# Patient Record
Sex: Female | Born: 1945 | Race: White | Hispanic: No | Marital: Married | Smoking: Former smoker
Health system: Southern US, Community
[De-identification: ages and names within clinical notes are randomized; demographics above are authoritative.]

## PROBLEM LIST (undated history)

## (undated) DIAGNOSIS — K529 Noninfective gastroenteritis and colitis, unspecified: Secondary | ICD-10-CM

## (undated) DIAGNOSIS — I10 Essential (primary) hypertension: Secondary | ICD-10-CM

## (undated) DIAGNOSIS — M8580 Other specified disorders of bone density and structure, unspecified site: Secondary | ICD-10-CM

## (undated) DIAGNOSIS — Z9889 Other specified postprocedural states: Secondary | ICD-10-CM

## (undated) DIAGNOSIS — L439 Lichen planus, unspecified: Secondary | ICD-10-CM

## (undated) DIAGNOSIS — M1990 Unspecified osteoarthritis, unspecified site: Secondary | ICD-10-CM

## (undated) DIAGNOSIS — Z9289 Personal history of other medical treatment: Secondary | ICD-10-CM

## (undated) DIAGNOSIS — E039 Hypothyroidism, unspecified: Secondary | ICD-10-CM

## (undated) DIAGNOSIS — E785 Hyperlipidemia, unspecified: Secondary | ICD-10-CM

## (undated) HISTORY — DX: Personal history of other medical treatment: Z92.89

## (undated) HISTORY — PX: APPENDECTOMY: SHX54

## (undated) HISTORY — DX: Noninfective gastroenteritis and colitis, unspecified: K52.9

## (undated) HISTORY — DX: Essential (primary) hypertension: I10

## (undated) HISTORY — PX: OOPHORECTOMY: SHX86

## (undated) HISTORY — PX: DILATION AND CURETTAGE OF UTERUS: SHX78

## (undated) HISTORY — DX: Hyperlipidemia, unspecified: E78.5

## (undated) HISTORY — DX: Other specified disorders of bone density and structure, unspecified site: M85.80

## (undated) HISTORY — PX: OTHER SURGICAL HISTORY: SHX169

## (undated) HISTORY — DX: Unspecified osteoarthritis, unspecified site: M19.90

## (undated) HISTORY — DX: Hypothyroidism, unspecified: E03.9

## (undated) HISTORY — PX: TUBAL LIGATION: SHX77

## (undated) HISTORY — DX: Other specified postprocedural states: Z98.890

## (undated) HISTORY — DX: Lichen planus, unspecified: L43.9

## (undated) HISTORY — PX: HERNIA REPAIR: SHX51

## (undated) SURGERY — Surgical Case
Anesthesia: *Unknown

---

## 1983-05-21 HISTORY — PX: NEPHRECTOMY: SHX65

## 1996-05-20 HISTORY — PX: COLON SURGERY: SHX602

## 2005-09-17 ENCOUNTER — Ambulatory Visit: Payer: Self-pay | Admitting: Family Medicine

## 2006-12-25 ENCOUNTER — Ambulatory Visit: Payer: Self-pay | Admitting: Family Medicine

## 2007-02-20 ENCOUNTER — Encounter: Payer: Self-pay | Admitting: Family Medicine

## 2007-03-03 ENCOUNTER — Ambulatory Visit: Payer: Self-pay | Admitting: Unknown Physician Specialty

## 2008-02-03 ENCOUNTER — Encounter: Payer: Self-pay | Admitting: Family Medicine

## 2008-02-03 ENCOUNTER — Ambulatory Visit: Payer: Self-pay | Admitting: Family Medicine

## 2009-10-27 ENCOUNTER — Ambulatory Visit: Payer: Self-pay | Admitting: Family Medicine

## 2009-10-27 DIAGNOSIS — Z905 Acquired absence of kidney: Secondary | ICD-10-CM | POA: Insufficient documentation

## 2009-10-27 DIAGNOSIS — I10 Essential (primary) hypertension: Secondary | ICD-10-CM | POA: Insufficient documentation

## 2009-10-27 DIAGNOSIS — M1990 Unspecified osteoarthritis, unspecified site: Secondary | ICD-10-CM | POA: Insufficient documentation

## 2009-10-27 DIAGNOSIS — E785 Hyperlipidemia, unspecified: Secondary | ICD-10-CM | POA: Insufficient documentation

## 2009-10-27 DIAGNOSIS — E039 Hypothyroidism, unspecified: Secondary | ICD-10-CM | POA: Insufficient documentation

## 2009-10-27 LAB — CONVERTED CEMR LAB: Vit D, 25-Hydroxy: 33 ng/mL (ref 30–89)

## 2009-10-30 LAB — CONVERTED CEMR LAB
ALT: 27 units/L (ref 0–35)
AST: 24 units/L (ref 0–37)
Albumin: 4.5 g/dL (ref 3.5–5.2)
Alkaline Phosphatase: 76 units/L (ref 39–117)
Bilirubin, Direct: 0.2 mg/dL (ref 0.0–0.3)
CO2: 28 meq/L (ref 19–32)
Chloride: 105 meq/L (ref 96–112)
Free T4: 1.2 ng/dL (ref 0.6–1.6)
Glucose, Bld: 93 mg/dL (ref 70–99)
Potassium: 3.2 meq/L — ABNORMAL LOW (ref 3.5–5.1)
Sodium: 141 meq/L (ref 135–145)
Total Protein: 7.6 g/dL (ref 6.0–8.3)

## 2009-11-02 ENCOUNTER — Ambulatory Visit: Payer: Self-pay | Admitting: Family Medicine

## 2009-11-02 ENCOUNTER — Other Ambulatory Visit: Admission: RE | Admit: 2009-11-02 | Discharge: 2009-11-02 | Payer: Self-pay | Admitting: Family Medicine

## 2009-11-02 LAB — CONVERTED CEMR LAB
Cholesterol: 187 mg/dL (ref 0–200)
LDL Cholesterol: 103 mg/dL — ABNORMAL HIGH (ref 0–99)
Triglycerides: 119 mg/dL (ref 0.0–149.0)
VLDL: 23.8 mg/dL (ref 0.0–40.0)

## 2009-11-02 LAB — HM PAP SMEAR

## 2009-11-13 ENCOUNTER — Encounter (INDEPENDENT_AMBULATORY_CARE_PROVIDER_SITE_OTHER): Payer: Self-pay | Admitting: *Deleted

## 2009-11-13 LAB — CONVERTED CEMR LAB: Pap Smear: NORMAL

## 2009-11-17 ENCOUNTER — Telehealth: Payer: Self-pay | Admitting: Family Medicine

## 2009-11-22 ENCOUNTER — Encounter: Payer: Self-pay | Admitting: Family Medicine

## 2009-11-22 ENCOUNTER — Ambulatory Visit: Payer: Self-pay | Admitting: Family Medicine

## 2010-01-10 ENCOUNTER — Telehealth: Payer: Self-pay | Admitting: Family Medicine

## 2010-02-05 ENCOUNTER — Ambulatory Visit: Payer: Self-pay | Admitting: Family Medicine

## 2010-02-05 LAB — CONVERTED CEMR LAB
ALT: 17 units/L (ref 0–35)
Bilirubin, Direct: 0.1 mg/dL (ref 0.0–0.3)
HDL: 54.6 mg/dL (ref >39.00)
LDL Cholesterol: 81 mg/dL (ref 0–99)
Total Bilirubin: 0.9 mg/dL (ref 0.3–1.2)
Total Protein: 7.2 g/dL (ref 6.0–8.3)
VLDL: 31.2 mg/dL (ref 0.0–40.0)

## 2010-04-24 ENCOUNTER — Telehealth: Payer: Self-pay | Admitting: Family Medicine

## 2010-05-29 ENCOUNTER — Telehealth: Payer: Self-pay | Admitting: Family Medicine

## 2010-06-19 NOTE — Miscellaneous (Signed)
Summary: pap results  Clinical Lists Changes  Observations: Added new observation of PAP SMEAR: normal (11/13/2009 9:01)      Preventive Care Screening  Pap Smear:    Date:  11/13/2009    Results:  normal

## 2010-06-19 NOTE — Assessment & Plan Note (Signed)
Summary: CPX W/PAP/RBH   Vital Signs:  Patient profile:   65 year old female Height:      63.25 inches Weight:      160 pounds BMI:     28.22 Temp:     98.4 degrees F oral Pulse rate:   76 / minute Pulse rhythm:   regular BP sitting:   130 / 82  (left arm) Cuff size:   regular  Vitals Entered By: Linde Gillis CMA Duncan Dull) (November 02, 2009 8:06 AM) CC: 30 minute exam with pap   History of Present Illness: 65 yo here for CPX with pap.  Hypothyroidism- has been on Levothyroxine 75 micrograms for years.  TSH stable.  HLD- on Simvastatin 20 mg daily and Fish oil 2000 mg daily.  Has had myalgias since she started Simva years ago.  No muscle weakness.  Has not had lipid panel in years.  HTN- on amlodipine 10 mg daily and HCTZ 25 mg daily.   No CP, blurred vision, SOB.  Arthritis- hands, knees and feet hurt all the time.  Was told she has osteoarthritis, has swelling/deformity in hands.  Not taking anything for pain at this time.   No redness or warmth.  Well woman- due for pap, mammogram, colonoscopy, and immunizations.  She is refusing all immunzations.  She wants to wait before scheduling colonscopy because of her current insurance (HSA).  Will be on Medicare in March.  Allergies (verified): No Known Drug Allergies  Review of Systems      See HPI General:  Denies chills and fever. Eyes:  Denies blurring. ENT:  Denies decreased hearing. CV:  Denies chest pain or discomfort. Resp:  Denies shortness of breath. GI:  Denies abdominal pain, bloody stools, and change in bowel habits. GU:  Denies abnormal vaginal bleeding, discharge, and dysuria. MS:  Complains of joint pain and joint swelling; denies joint redness. Derm:  Denies rash. Neuro:  Denies headaches. Psych:  Denies anxiety and depression. Endo:  Denies cold intolerance and heat intolerance.  Physical Exam  General:  alert, well-developed, and overweight-appearing.   Head:  normocephalic, atraumatic, and no abnormalities  observed.   Eyes:  vision grossly intact, pupils equal, and pupils round.   Ears:  R ear normal and L ear normal.   Nose:  no external deformity.   Mouth:  good dentition.   Neck:  No deformities, masses, or tenderness noted. Breasts:  No mass, nodules, thickening, tenderness, bulging, retraction, inflamation, nipple discharge or skin changes noted.   Lungs:  Normal respiratory effort, chest expands symmetrically. Lungs are clear to auscultation, no crackles or wheezes. Heart:  Normal rate and regular rhythm. S1 and S2 normal without gallop, murmur, click, rub or other extra sounds. Abdomen:  Bowel sounds positive,abdomen soft and non-tender without masses, organomegaly or hernias noted. Rectal:  no external abnormalities and no hemorrhoids.   Genitalia:  Pelvic Exam:        External: normal female genitalia without lesions or masses        Vagina: normal without lesions or masses        Cervix: normal without lesions or masses        Adnexa: normal bimanual exam without masses or fullness        Uterus: normal by palpation        Pap smear: performed Msk:  No deformity or scoliosis noted of thoracic or lumbar spine.   Extremities:  trace left pedal edema and trace right pedal edema.  trace  left pedal edema.   enlarged DIP, PIP bilaterally, no redness or erythema. Neurologic:  alert & oriented X3.   Skin:  Intact without suspicious lesions or rashes Psych:  Cognition and judgment appear intact. Alert and cooperative with normal attention span and concentration. No apparent delusions, illusions, hallucinations   Impression & Recommendations:  Problem # 1:  Preventive Health Care (ICD-V70.0) Reviewed preventive care protocols, scheduled due services, and updated immunizations Discussed nutrition, exercise, diet, and healthy lifestyle. Pap today. Colonscopy deferred.  Problem # 2:  HYPERLIPIDEMIA (ICD-272.4) Assessment: Unchanged Recheck FLP today, perhpas wean off of Statin if  stable. Her updated medication list for this problem includes:    Simvastatin 20 Mg Tabs (Simvastatin) .Marland Kitchen... Take one tablet by mouth daily  Orders: TLB-Lipid Panel (80061-LIPID)  Labs Reviewed: SGOT: 24 (10/27/2009)   SGPT: 27 (10/27/2009)  Problem # 3:  OSTEOARTHRITIS (ICD-715.90) Assessment: Deteriorated Tramadol, follow up with me in 3 weeks to see if there is any improvement. Her updated medication list for this problem includes:    Aspirin 81 Mg Tabs (Aspirin) .Marland Kitchen... Take one tablet by mouth daily    Tramadol Hcl 50 Mg Tabs (Tramadol hcl) .Marland Kitchen... 1 tab by mouth two times a day as needed pain  Complete Medication List: 1)  Fish Oil 1000 Mg Caps (Omega-3 fatty acids) .... Take one tablet by mouth two times a day 2)  Levothroid 75 Mcg Tabs (Levothyroxine sodium) .... Take one tablet by mouth daily 3)  Simvastatin 20 Mg Tabs (Simvastatin) .... Take one tablet by mouth daily 4)  Amlodipine Besylate 10 Mg Tabs (Amlodipine besylate) .... Take one tablet by mouth daily 5)  Hydrochlorothiazide 25 Mg Tabs (Hydrochlorothiazide) .... Take one tablet by mouth daily 6)  Aspirin 81 Mg Tabs (Aspirin) .... Take one tablet by mouth daily 7)  Tramadol Hcl 50 Mg Tabs (Tramadol hcl) .Marland Kitchen.. 1 tab by mouth two times a day as needed pain  Patient Instructions: 1)  Great to see you, Heather Potter. 2)  I will call you with your cholesterol results tomorrow or Monday. Prescriptions: TRAMADOL HCL 50 MG  TABS (TRAMADOL HCL) 1 tab by mouth two times a day as needed pain  #30 x 0   Entered and Authorized by:   Ruthe Mannan MD   Signed by:   Ruthe Mannan MD on 11/02/2009   Method used:   Electronically to        [facility] [address] (retail)       [address]       [place], [place]  [address]       Ph: [phone]       Fax: [phone]   RxID:   [id number]   Current Allergies (reviewed today): No known allergies     Prevention & Chronic Care Immunizations   Influenza vaccine: Not  documented   Influenza vaccine due: Refused  (10/27/2009)    Tetanus booster: Not documented   Tetanus booster due: Refused  (10/27/2009)    Pneumococcal vaccine: Not documented    H. zoster vaccine: Not documented  Colorectal Screening   Hemoccult: Not documented   Hemoccult action/deferral: Deferred  (11/02/2009)    Colonoscopy: normal  (11/03/1997)   Colonoscopy action/deferral: Deferred  (11/02/2009)   Colonoscopy due: 11/04/2007  Other Screening   Pap smear: Not documented   Pap smear action/deferral: Ordered  (11/02/2009)    Mammogram: Not documented   Mammogram action/deferral: Ordered  (11/02/2009)    DXA bone density scan: Not documented  Smoking status: Not documented  Lipids   Total Cholesterol: Not documented   Lipid panel action/deferral: Lipid Panel ordered   LDL: Not documented   LDL Direct: Not documented   HDL: Not documented   Triglycerides: Not documented    SGOT (AST): 24  (10/27/2009)   SGPT (ALT): 27  (10/27/2009)   Alkaline phosphatase: 76  (10/27/2009)   Total bilirubin: 1.1  (10/27/2009)    Lipid flowsheet reviewed?: Yes   Progress toward LDL goal: At goal  Hypertension   Last Blood Pressure: 130 / 82  (11/02/2009)   Serum creatinine: 0.9  (10/27/2009)   Serum potassium 3.2  (10/27/2009)  Self-Management Support :    Hypertension self-management support: Not documented    Lipid self-management support: Not documented    Nursing Instructions: Pap smear today

## 2010-06-19 NOTE — Progress Notes (Signed)
Summary: Tramadol helping her pain  Phone Note Call from Patient Call back at Home Phone [phone]   Caller: Patient Call For: Ruthe Mannan MD Summary of Call: Patient called to let Dr. Dayton Martes know that the Tramadol is helping her pain.  She does still have a little pain in her hands from time to time.  She was only given 30 tablets and wanted to know if Dr. Dayton Martes wants to keep her on this medication or not.  If so she needs another Rx sent in to [facility] on [address]  Please advise. Initial call taken by: Linde Gillis CMA Duncan Dull),  November 17, 2009 3:59 PM    Prescriptions: TRAMADOL HCL 50 MG  TABS (TRAMADOL HCL) 1 tab by mouth two times a day as needed pain  #60 x 0   Entered and Authorized by:   Ruthe Mannan MD   Signed by:   Ruthe Mannan MD on 11/17/2009   Method used:   Electronically to        [facility] [address] (retail)       [address]       [place], [place]  [address]       Ph: [phone]       Fax: [phone]   RxID:   [id number] TRAMADOL HCL 50 MG  TABS (TRAMADOL HCL) 1 tab by mouth two times a day as needed pain  #60 x 0   Entered and Authorized by:   Ruthe Mannan MD   Signed by:   Ruthe Mannan MD on 11/17/2009   Method used:   Telephoned to ...         RxID:   [id number]   Appended Document: Tramadol helping her pain Patient notified, Rx sent to pharmacy.

## 2010-06-19 NOTE — Progress Notes (Signed)
Summary: tramadol   Phone Note Refill Request Message from:  Fax from Pharmacy on April 24, 2010 11:18 AM  Refills Requested: Medication #1:  TRAMADOL HCL 50 MG  TABS 1 tab by mouth two times a day as needed pain.   Last Refilled: 01/10/2010 Refill request from [facility] pharmacy [phone].   Initial call taken by: Melody Comas,  April 24, 2010 11:20 AM  Follow-up for Phone Call        Rx called to pharmacy.  Follow-up by: Melody Comas,  April 24, 2010 1:53 PM    Prescriptions: TRAMADOL HCL 50 MG  TABS (TRAMADOL HCL) 1 tab by mouth two times a day as needed pain  #60 x 0   Entered and Authorized by:   Ruthe Mannan MD   Signed by:   Ruthe Mannan MD on 04/24/2010   Method used:   Telephoned to ...         RxID:   [id number]

## 2010-06-19 NOTE — Letter (Signed)
Summary: Generic Letter  [facility]  [address] [place], [place] [address]   Phone: [phone]  Fax: [phone]    02/05/2010  Heather Potter [address] [place], [place]  [address]  Dear [name],    [name] we have received your lab results back.  Your cholesterol on the lower dose of simvastatin looks great.  Triglycerides barely elevated but otherwise fantastic!  I wouldn't change a thing!  Keep up the good work!       Sincerely,        Linde Gillis CMA (AAMA) for Dr. Ruthe Mannan, MD

## 2010-06-19 NOTE — Assessment & Plan Note (Signed)
Summary: NEW PT TO ESTABH/DLO   Vital Signs:  Patient profile:   65 year old female Height:      63.25 inches Weight:      160 pounds BMI:     28.22 Temp:     97.8 degrees F oral Pulse rate:   72 / minute Pulse rhythm:   regular BP sitting:   120 / 72  (left arm) Cuff size:   regular  Vitals Entered By: Linde Gillis CMA Duncan Dull) (2009/10/30 9:49 AM) CC: new patient   History of Present Illness: 65 yo here to establish care.  Hypothyroidism- has been on Levothyroxine 75 micrograms for years.  Has not noticed any symptoms of hypo or hyothyroidism.  Has been at least 2 years since she has had lab work.  HLD- on Simvastatin 20 mg daily and Fish oil 2000 mg daily.  Has had myalgias since she started Simva years ago.  No muscle weakness.  Has not had lipid panel in years.  HTN- on amlodipine 10 mg daily and HCTZ 25 mg daily.  She is unsure if has ever tried an ACEI.  Does have LE edema on amlodipine.  No CP, blurred vision, SOB.  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  Has never had issues with renal failure.  Arthritis- hands, knees and feet hurt all the time.  Was told she has osteoarthritis, has swelling/deformity in hands.  Not taking anything for pain at this time.   No redness or warmth.  Well woman- due for pap, mammogram, colonoscopy, and immunizations.  She is refusing all immunzations.  She wants to wait before scheduling colonscopy because of her current insurance (HSA).  Will be on Medicare in March.  Preventive Screening-Counseling & Management  Caffeine-Diet-Exercise     Does Patient Exercise: yes      Drug Use:  no.    Current Medications (verified): 1)  Fish Oil 1000 Mg Caps (Omega-3 Fatty Acids) .... Take One Tablet By Mouth Two Times A Day 2)  Levothroid 75 Mcg Tabs (Levothyroxine Sodium) .... Take One Tablet By Mouth Daily 3)  Simvastatin 20 Mg Tabs (Simvastatin) .... Take One Tablet By Mouth Daily 4)  Amlodipine Besylate 10 Mg Tabs  (Amlodipine Besylate) .... Take One Tablet By Mouth Daily 5)  Hydrochlorothiazide 25 Mg Tabs (Hydrochlorothiazide) .... 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  Quit smoking in 2009. Alcohol use-yes Drug use-no Regular exercise-yes  Risk Factors: Exercise: yes (Oct 30, 2009)  Past Medical History: Hyperlipidemia Hypertension Hypothyroidism Osteoarthritis  Past Surgical History: Appendectomy GYN surgery- she knows that she had a BTL and right ovary removed, unsure about her uterus/cervix. 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  Family History: Reviewed history and no changes required. 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  Quit smoking in 2009. Alcohol use-yes Drug use-no Regular exercise-yes Drug Use:  no Does Patient Exercise:  yes  Review of Systems      See HPI General:  Denies chills, fever, and malaise. Eyes:  Denies blurring. ENT:  Denies difficulty  swallowing. CV:  Complains of swelling of feet; denies chest pain or discomfort, leg cramps with exertion, lightheadness, and shortness of breath with exertion. Resp:  Denies shortness of breath. GI:  Denies abdominal pain, bloody stools, and change in bowel habits. GU:  Denies abnormal vaginal bleeding, dysuria, and urinary frequency. MS:  Complains of joint pain; denies joint redness and joint swelling. Derm:  Denies rash. Neuro:  Denies headaches. Psych:  Denies anxiety and depression. Endo:  Denies excessive hunger and polyuria. Heme:  Denies abnormal bruising.  Physical  Exam  General:  alert, well-developed, and overweight-appearing.   Head:  normocephalic, atraumatic, and no abnormalities observed.   Eyes:  vision grossly intact, pupils equal, and pupils round.   Ears:  R ear normal and L ear normal.   Nose:  no external deformity.   Mouth:  good dentition.   Neck:  No deformities, masses, or tenderness noted. Lungs:  Normal respiratory effort, chest expands symmetrically. Lungs are clear to auscultation, no crackles or wheezes. Heart:  Normal rate and regular rhythm. S1 and S2 normal without gallop, murmur, click, rub or other extra sounds. Abdomen:  Bowel sounds positive,abdomen soft and non-tender without masses, organomegaly or hernias noted. Msk:  enlarged PIP joints and enlarged DIP joints.  enlarged PIP joints and enlarged DIP joints.   Extremities:  trace left pedal edema and trace right pedal edema.  trace left pedal edema.   Neurologic:  alert & oriented X3.  alert & oriented X3.   Skin:  Intact without suspicious lesions or rashes Psych:  Cognition and judgment appear intact. Alert and cooperative with normal attention span and concentration. No apparent delusions, illusions, hallucinations   Impression & Recommendations:  Problem # 1:  OSTEOARTHRITIS (ICD-715.90) Assessment Deteriorated Awaiting labs from current physician, but will draw ESR and RA today.   Needs to consider something for  inflammation since her symptoms are worsening,  Will discuss at CPX once we have labs and old records. Orders: Venipuncture ([address]) TLB-Sedimentation Rate (ESR) (85652-ESR) TLB-Rheumatoid Factor (RA) (60[address]-UJ) T-Vitamin D (25-Hydroxy) ([address]29)  Problem # 2:  HYPERLIPIDEMIA (ICD-272.4) Assessment: Unchanged  recheck FLP and hepatic panel today. Also check Vit d as deficiency can worsen myalgias. Her updated medication list for this problem includes:    Simvastatin 20 Mg Tabs (Simvastatin) .Marland Kitchen... Take one tablet by mouth  daily  Orders: TLB-Hepatic/Liver Function Pnl (80076-HEPATIC) T-Vitamin D (25-Hydroxy) ([address]-08657)  Her updated medication list for this problem includes:    Simvastatin 20 Mg Tabs (Simvastatin) .Marland Kitchen... Take one tablet by mouth daily  Problem # 3:  HYPERTENSION (ICD-401.9) Assessment: Unchanged  Stable for now. ?why she is on amlodipine since she does have some LE edema (not currently much on exam today).  Will await records.  ?is there a contraindication to an ACEI s/p nephrectomy. Her updated medication list for this problem includes:    Amlodipine Besylate 10 Mg Tabs (Amlodipine besylate) .Marland Kitchen... Take one tablet by mouth daily    Hydrochlorothiazide 25 Mg Tabs (Hydrochlorothiazide) .Marland Kitchen... Take one tablet by mouth daily  Orders: Venipuncture ([address]) TLB-BMP (Basic Metabolic Panel-BMET) (80048-METABOL)  Her updated medication list for this problem includes:    Amlodipine Besylate 10 Mg Tabs (Amlodipine besylate) .Marland Kitchen... Take one tablet by mouth daily    Hydrochlorothiazide 25 Mg Tabs (Hydrochlorothiazide) .Marland Kitchen... Take one tablet by mouth daily  Problem # 4:  HYPOTHYROIDISM (ICD-244.9) Assessment: Unchanged  Recheck TSH, T3, T4 today. Her updated medication list for this problem includes:    Levothroid 75  Mcg Tabs (Levothyroxine sodium) .Marland Kitchen... Take one tablet by mouth daily  Orders: Venipuncture ([address]) TLB-TSH (Thyroid Stimulating Hormone) (84443-TSH) TLB-T4 (Thyrox), Free [phone]) Specimen Handling (82956) T-Vitamin D (25-Hydroxy) (21308-65784)  Her updated medication list for this problem includes:    Levothroid 75 Mcg Tabs (Levothyroxine sodium) .Marland Kitchen... Take one tablet by mouth daily  Problem # 5:  Preventive Health Care (ICD-V70.0) Assessment: Comment Only set up mammogram today. schedule CPX/pap.  Complete Medication List: 1)  Fish Oil 1000 Mg Caps (Omega-3 fatty acids) .... Take one tablet by mouth two times a day 2)  Levothroid 75 Mcg Tabs (Levothyroxine sodium)  .... Take one tablet by mouth daily 3)  Simvastatin 20 Mg Tabs (Simvastatin) .... Take one tablet by mouth daily 4)  Amlodipine Besylate 10 Mg Tabs (Amlodipine besylate) .... Take one tablet by mouth daily 5)  Hydrochlorothiazide 25 Mg Tabs (Hydrochlorothiazide) .... Take one tablet by mouth daily  Other Orders: Radiology Referral (Radiology)  Patient Instructions: 1)  Great to meet you. 2)  Please stop by to see Shirlee Limerick on your way out to set up your mammogram. 3)  please set up your complete physical and pap smear.  Current Allergies (reviewed today): No known allergies   Flu Vaccine Next Due:  Refused TD Next Due:  Refused Herpes Zoster Next Due:  Refused Colonoscopy Result Date:  11/03/1997 Colonoscopy Result:  normal Colonoscopy Next Due:  10 yr

## 2010-06-19 NOTE — Letter (Signed)
Summary: Records Dated 04-04-06 thru 07-06-08/[facility]  Records Dated 04-04-06 thru 07-06-08/[facility]   Imported By: Lanelle Bal 11/21/2009 12:25:16  _____________________________________________________________________  External Attachment:    Type:   Image     Comment:   External Document

## 2010-06-19 NOTE — Progress Notes (Signed)
Summary: refill request for tramadol  Phone Note Refill Request Message from:  Fax from Pharmacy  Refills Requested: Medication #1:  TRAMADOL HCL 50 MG  TABS 1 tab by mouth two times a day as needed pain.   Last Refilled: 11/17/2009 Faxed request from [facility], [phone]  Initial call taken by: Lowella Petties CMA,  January 10, 2010 8:21 AM  Follow-up for Phone Call        Rx called to pharmacy Follow-up by: Linde Gillis CMA Duncan Dull),  January 10, 2010 8:48 AM    Prescriptions: TRAMADOL HCL 50 MG  TABS (TRAMADOL HCL) 1 tab by mouth two times a day as needed pain  #60 x 0   Entered and Authorized by:   Ruthe Mannan MD   Signed by:   Ruthe Mannan MD on 01/10/2010   Method used:   Telephoned to ...         RxID:   [id number]

## 2010-06-21 NOTE — Progress Notes (Signed)
Summary: refill request for tramadol  Phone Note Refill Request Call back at [facility] Phone [phone] Message from:  Patient  Refills Requested: Medication #1:  TRAMADOL HCL 50 MG  TABS 1 tab by mouth two times a day as needed pain. Pt wants a 90 day supply called to [facility] in [place].  Initial call taken by: Lowella Petties CMA, AAMA,  May 29, 2010 11:06 AM Caller: Patient Call For: Heather Mannan MD  Follow-up for Phone Call        Rx called to pharmacy, patient notified via telephone. Follow-up by: Linde Gillis CMA Duncan Dull),  May 29, 2010 11:47 AM    Prescriptions: TRAMADOL HCL 50 MG  TABS (TRAMADOL HCL) 1 tab by mouth two times a day as needed pain  #180 x 0   Entered and Authorized by:   Heather Mannan MD   Signed by:   Heather Mannan MD on 05/29/2010   Method used:   Telephoned to ...         RxID:   [id number] TRAMADOL HCL 50 MG  TABS (TRAMADOL HCL) 1 tab by mouth two times a day as needed pain  #60 x 0   Entered and Authorized by:   Heather Mannan MD   Signed by:   Heather Mannan MD on 05/29/2010   Method used:   Telephoned to ...         RxID:   [id number]

## 2010-08-01 ENCOUNTER — Telehealth: Payer: Self-pay | Admitting: Family Medicine

## 2010-08-07 NOTE — Progress Notes (Signed)
Summary: needs new scripts for mail order  Phone Note Refill Request Call back at [facility] Phone [phone] Message from:  Patient  Refills Requested: Medication #1:  LEVOTHROID 75 MCG TABS take one tablet by mouth daily  Medication #2:  AMLODIPINE BESYLATE 10 MG TABS take one tablet by mouth daily  Medication #3:  HYDROCHLOROTHIAZIDE 25 MG TABS take one tablet by mouth daily  Medication #4:  SIMVASTATIN 20 MG TABS take one tablet by mouth daily Pt needs written scripts for mail order, please call when ready and she will pick up.  Initial call taken by: Lowella Petties CMA, AAMA,  August 01, 2010 10:32 AM    Prescriptions: HYDROCHLOROTHIAZIDE 25 MG TABS (HYDROCHLOROTHIAZIDE) take one tablet by mouth daily  #90 x 3   Entered and Authorized by:   Ruthe Mannan MD   Signed by:   Ruthe Mannan MD on 08/01/2010   Method used:   Print then Give to Patient   RxID:   [id number] AMLODIPINE BESYLATE 10 MG TABS (AMLODIPINE BESYLATE) take one tablet by mouth daily  #90 x 3   Entered and Authorized by:   Ruthe Mannan MD   Signed by:   Ruthe Mannan MD on 08/01/2010   Method used:   Print then Give to Patient   RxID:   [id number] SIMVASTATIN 20 MG TABS (SIMVASTATIN) take one tablet by mouth daily  #90 x 3   Entered and Authorized by:   Ruthe Mannan MD   Signed by:   Ruthe Mannan MD on 08/01/2010   Method used:   Print then Give to Patient   RxID:   [id number] LEVOTHROID 75 MCG TABS (LEVOTHYROXINE SODIUM) take one tablet by mouth daily  #90 x 3   Entered and Authorized by:   Ruthe Mannan MD   Signed by:   Ruthe Mannan MD on 08/01/2010   Method used:   Print then Give to Patient   RxID:   [id number]   Appended Document: needs new scripts for mail order Left message with female that answered the phone, not Ms. Vento that Rx's were ready for pick up will be left at front desk.

## 2010-11-19 ENCOUNTER — Encounter: Payer: Self-pay | Admitting: Gastroenterology

## 2010-11-19 ENCOUNTER — Telehealth: Payer: Self-pay | Admitting: *Deleted

## 2010-11-19 DIAGNOSIS — Z1231 Encounter for screening mammogram for malignant neoplasm of breast: Secondary | ICD-10-CM

## 2010-11-19 DIAGNOSIS — Z1211 Encounter for screening for malignant neoplasm of colon: Secondary | ICD-10-CM

## 2010-11-19 NOTE — Telephone Encounter (Signed)
Patient is asking for an order for her yearly mammogram to be sent to [facility] center, also is asking if she could get referral for her colonoscopy.

## 2010-11-19 NOTE — Telephone Encounter (Signed)
Orders entered

## 2010-11-20 ENCOUNTER — Other Ambulatory Visit: Payer: Self-pay | Admitting: Family Medicine

## 2010-11-20 DIAGNOSIS — I10 Essential (primary) hypertension: Secondary | ICD-10-CM

## 2010-11-20 DIAGNOSIS — E785 Hyperlipidemia, unspecified: Secondary | ICD-10-CM

## 2010-11-20 DIAGNOSIS — E039 Hypothyroidism, unspecified: Secondary | ICD-10-CM

## 2010-11-21 ENCOUNTER — Encounter: Payer: Self-pay | Admitting: Family Medicine

## 2010-11-22 ENCOUNTER — Other Ambulatory Visit (INDEPENDENT_AMBULATORY_CARE_PROVIDER_SITE_OTHER): Payer: MEDICARE | Admitting: Family Medicine

## 2010-11-22 DIAGNOSIS — E785 Hyperlipidemia, unspecified: Secondary | ICD-10-CM

## 2010-11-22 DIAGNOSIS — I10 Essential (primary) hypertension: Secondary | ICD-10-CM

## 2010-11-22 DIAGNOSIS — E039 Hypothyroidism, unspecified: Secondary | ICD-10-CM

## 2010-11-22 LAB — LIPID PANEL: Cholesterol: 176 mg/dL (ref 0–200)

## 2010-11-22 LAB — BASIC METABOLIC PANEL
BUN: 22 mg/dL (ref 6–23)
Chloride: 106 mEq/L (ref 96–112)
Glucose, Bld: 92 mg/dL (ref 70–99)
Potassium: 3.6 mEq/L (ref 3.5–5.1)
Sodium: 137 mEq/L (ref 135–145)

## 2010-11-22 LAB — TSH: TSH: 0.8 u[IU]/mL (ref 0.35–5.50)

## 2010-11-28 ENCOUNTER — Ambulatory Visit (INDEPENDENT_AMBULATORY_CARE_PROVIDER_SITE_OTHER): Payer: MEDICARE | Admitting: Family Medicine

## 2010-11-28 ENCOUNTER — Encounter: Payer: Self-pay | Admitting: Family Medicine

## 2010-11-28 VITALS — BP 110/80 | HR 61 | Temp 98.2°F | Ht 64.0 in | Wt 154.8 lb

## 2010-11-28 DIAGNOSIS — Z780 Asymptomatic menopausal state: Secondary | ICD-10-CM

## 2010-11-28 DIAGNOSIS — Z2911 Encounter for prophylactic immunotherapy for respiratory syncytial virus (RSV): Secondary | ICD-10-CM

## 2010-11-28 DIAGNOSIS — E785 Hyperlipidemia, unspecified: Secondary | ICD-10-CM

## 2010-11-28 DIAGNOSIS — Z0000 Encounter for general adult medical examination without abnormal findings: Secondary | ICD-10-CM

## 2010-11-28 DIAGNOSIS — I10 Essential (primary) hypertension: Secondary | ICD-10-CM

## 2010-11-28 DIAGNOSIS — E039 Hypothyroidism, unspecified: Secondary | ICD-10-CM

## 2010-11-28 DIAGNOSIS — Z23 Encounter for immunization: Secondary | ICD-10-CM

## 2010-11-28 DIAGNOSIS — Z13820 Encounter for screening for osteoporosis: Secondary | ICD-10-CM

## 2010-11-28 NOTE — Progress Notes (Signed)
65 yo here for her initial welcome to medicare visit.  I have personally reviewed the Medicare Annual Wellness questionnaire and have noted 1. The patient's medical and social history 2. Their use of alcohol, tobacco or illicit drugs 3. Their current medications and supplements 4. The patient's functional ability including ADL's, fall risks, home safety risks and hearing or visual             Impairment.- independent for all ADLs. 5. Diet and physical activities- very physically active 6. Evidence for depression or mood disorders- no signs or symptoms of depression or mood disorders.    Hypothyroidism- has been on Levothyroxine 75 micrograms for years.  TSH stable. Lab Results  Component Value Date   TSH 0.80 11/22/2010    HLD- on Simvastatin 20 mg daily and Fish oil 2000 mg daily.    Lab Results  Component Value Date   HDL 62.40 11/22/2010   HDL 54.60 02/05/2010   HDL 36.64 11/02/2009   Lab Results  Component Value Date   LDLCALC 98 11/22/2010   LDLCALC 81 02/05/2010   LDLCALC 103* 11/02/2009   Lab Results  Component Value Date   TRIG 76.0 11/22/2010   TRIG 156.0* 02/05/2010   TRIG 119.0 11/02/2009    Lab Results  Component Value Date   ALT 17 02/05/2010   AST 20 02/05/2010   ALKPHOS 70 02/05/2010   BILITOT 0.9 02/05/2010     HTN- on amlodipine 10 mg daily and HCTZ 25 mg daily.   No CP, blurred vision, SOB.    Well woman- mammogram and colonoscopy scheduled.   Due for Zostavax and Pneumovax.  Patient Active Problem List  Diagnoses  . HYPOTHYROIDISM  . HYPERLIPIDEMIA  . HYPERTENSION  . OSTEOARTHRITIS  . NEPHRECTOMY, HX OF  . Routine general medical examination at a health care facility   Past Medical History  Diagnosis Date  . Hyperlipidemia   . Hypertension   . Hypothyroidism   . Osteoarthritis   . History of gynecologic surgery     had right ovary removed, unsure about uterus/cervix   Past Surgical History  Procedure Date  . Appendectomy   . Tubal ligation      bilat  . 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 Other surgical history     part of intestine removed   History  Substance Use Topics  . Smoking status: Former Smoker    Quit date: 05/21/2007  . Smokeless tobacco: Not on file  . Alcohol Use: Yes   Family History  Problem Relation Age of Onset  . Pancreatic cancer Mother   . Heart attack Father   . Liver cancer Brother    Allergies not on file Current Outpatient Prescriptions on File Prior to Visit  Medication Sig Dispense Refill  . amLODipine (NORVASC) 10 MG tablet Take 10 mg by mouth daily.        Marland Kitchen aspirin 81 MG tablet Take 81 mg by mouth daily.        . fish oil-omega-3 fatty acids 1000 MG capsule Take 2 g by mouth 2 (two) times daily.        . hydrochlorothiazide 25 MG tablet Take 25 mg by mouth daily.        Marland Kitchen levothyroxine (SYNTHROID, LEVOTHROID) 75 MCG tablet Take 75 mcg by mouth daily.        . simvastatin (ZOCOR) 20 MG tablet Take 20 mg by mouth at bedtime.        Marland Kitchen  traMADol (ULTRAM) 50 MG tablet Take 50 mg by mouth 2 (two) times daily as needed. For pain        The PMH, PSH, Social History, Family History, Medications, and allergies have been reviewed in [facility], and have been updated if relevant.  ROS: Patient reports no  vision/ hearing changes,anorexia, weight change, fever ,adenopathy, persistant / recurrent hoarseness, swallowing issues, chest pain, edema,persistant / recurrent cough, hemoptysis, dyspnea(rest, exertional, paroxysmal nocturnal), gastrointestinal  bleeding (melena, rectal bleeding), abdominal pain, excessive heart burn, GU symptoms(dysuria, hematuria, pyuria, voiding/incontinence  Issues) syncope, focal weakness, severe memory loss, concerning skin lesions, depression, anxiety, abnormal bruising/bleeding, major joint swelling, breast masses or abnormal vaginal bleeding.     Physical Exam BP 110/80  Pulse 61  Temp(Src) 98.2 F (36.8 C) (Oral)  Ht 5\' 4"  (1.626 m)  Wt 154 lb 12 oz (70.194  kg)  BMI 26.56 kg/m2  General:  alert, well-developed, and overweight-appearing.   Head:  normocephalic, atraumatic, and no abnormalities observed.   Eyes:  vision grossly intact, pupils equal, and pupils round.   Ears:  R ear normal and L ear normal.   Nose:  no external deformity.   Mouth:  good dentition.   Neck:  No deformities, masses, or tenderness noted. Breasts:  No mass, nodules, thickening, tenderness, bulging, retraction, inflamation, nipple discharge or skin changes noted.   Lungs:  Normal respiratory effort, chest expands symmetrically. Lungs are clear to auscultation, no crackles or wheezes. Heart:  Normal rate and regular rhythm. S1 and S2 normal without gallop, murmur, click, rub or other extra sounds. Abdomen:  Bowel sounds positive,abdomen soft and non-tender without masses, organomegaly or hernias noted. Msk:  No deformity or scoliosis noted of thoracic or lumbar spine.   Extremities:  trace left pedal edema and trace right pedal edema.  trace left pedal edema.   enlarged DIP, PIP bilaterally, no redness or erythema. Neurologic:  alert & oriented X3.   Skin:  Intact without suspicious lesions or rashes Psych:  Cognition and judgment appear intact. Alert and cooperative with normal attention span and concentration. No apparent delusions, illusions, hallucinations  Assessment and Plan: 1.   2. HYPOTHYROIDISM Routine general medical examination at a health care facility   The patients weight, height, BMI and visual acuity have been recorded in the chart I have made referrals, counseling and provided education to the patient based review of the above and I have provided the pt with a written personalized care plan for preventive services.  EKG today.- likely normal variant- sinus bradycardia (asymptomatic), non specific ST changed. Zostavax, pneumovax today.   DEXA ordered today.  Colonoscopy and mammogram scheduled for August 2012. Stable.  Continue current dose of  synthroid.  3. HYPERTENSION  Stable.   4. HYPERLIPIDEMIA  Stable.

## 2010-11-28 NOTE — Patient Instructions (Signed)
Please stop by to see Heather Potter on your way out. 

## 2010-11-28 NOTE — Progress Notes (Signed)
Addended by: Gilmer Mor on: 11/28/2010 09:23 AM   Modules accepted: Orders

## 2010-11-28 NOTE — Progress Notes (Signed)
Addended by: Dianne Dun on: 11/28/2010 09:33 AM   Modules accepted: Orders

## 2010-11-29 ENCOUNTER — Encounter: Payer: Self-pay | Admitting: Family Medicine

## 2010-12-05 ENCOUNTER — Ambulatory Visit (AMBULATORY_SURGERY_CENTER): Payer: MEDICARE | Admitting: *Deleted

## 2010-12-05 VITALS — Ht 63.5 in | Wt 153.0 lb

## 2010-12-05 DIAGNOSIS — Z1211 Encounter for screening for malignant neoplasm of colon: Secondary | ICD-10-CM

## 2010-12-05 MED ORDER — PEG-KCL-NACL-NASULF-NA ASC-C 100 G PO SOLR: ORAL | Status: DC

## 2010-12-05 NOTE — Progress Notes (Signed)
Heather Potter had a colon In [facility] approximately 1998.  She also had a colectomy she thinks was for diverticulitis.  Her PCP should have these records. I gave the record release form to Robin.

## 2010-12-13 ENCOUNTER — Ambulatory Visit: Payer: Self-pay | Admitting: Family Medicine

## 2010-12-17 ENCOUNTER — Encounter: Payer: Self-pay | Admitting: Family Medicine

## 2010-12-19 ENCOUNTER — Ambulatory Visit (AMBULATORY_SURGERY_CENTER): Payer: MEDICARE | Admitting: Gastroenterology

## 2010-12-19 ENCOUNTER — Encounter: Payer: Self-pay | Admitting: Gastroenterology

## 2010-12-19 VITALS — BP 132/49 | HR 59 | Temp 97.2°F | Resp 31 | Ht 63.0 in | Wt 153.0 lb

## 2010-12-19 DIAGNOSIS — Z1211 Encounter for screening for malignant neoplasm of colon: Secondary | ICD-10-CM

## 2010-12-19 DIAGNOSIS — M8580 Other specified disorders of bone density and structure, unspecified site: Secondary | ICD-10-CM | POA: Insufficient documentation

## 2010-12-19 DIAGNOSIS — K5730 Diverticulosis of large intestine without perforation or abscess without bleeding: Secondary | ICD-10-CM

## 2010-12-19 MED ORDER — SODIUM CHLORIDE 0.9 % IV SOLN: 500.0000 mL | INTRAVENOUS | Status: DC

## 2010-12-19 NOTE — Patient Instructions (Addendum)
Diverticulosis Diverticulosis is a common condition that develops when small pouches (diverticula) form in the wall of the colon. The risk of diverticulosis increases with age. It happens more often in people who eat a low-fiber diet. Most individuals with diverticulosis have no symptoms. Those individuals with symptoms usually experience belly (abdominal) pain, constipation, or loose stools (diarrhea). HOME CARE INSTRUCTIONS  Increase the amount of fiber in your diet as directed by your caregiver or dietician. This may reduce symptoms of diverticulosis.   Your caregiver may recommend taking a dietary fiber supplement.   Drink at least 6 to 8 glasses of water each day to prevent constipation.   Try not to strain when you have a bowel movement.   Your caregiver may recommend avoiding nuts and seeds to prevent complications, although this is still an uncertain benefit.   Only take over-the-counter or prescription medicines for pain, discomfort, or fever as directed by your caregiver.  FOODS HAVING HIGH FIBER CONTENT INCLUDE:  Fruits. Apple, peach, pear, tangerine, raisins, prunes.   Vegetables. Brussels sprouts, asparagus, broccoli, cabbage, carrot, cauliflower, romaine lettuce, spinach, summer squash, tomato, winter squash, zucchini.   Starchy Vegetables. Baked beans, kidney beans, lima beans, split peas, lentils, potatoes (with skin).   Grains. Whole wheat bread, brown rice, bran flake cereal, plain oatmeal, white rice, shredded wheat, bran muffins.  SEEK IMMEDIATE MEDICAL CARE IF:  You develop increasing pain or severe bloating.   You have an oral temperature above 100, not controlled by medicine.   You develop vomiting or bowel movements that are bloody or black.  Document Released: 02/01/2004 Document Re-Released: 10/24/2009 [employer] Patient Information 2011 [employer]. Discharge instructions given with verbal understanding. Handouts on diverticulosis and a high fiber diet  given. Resume previous medications. 

## 2010-12-19 NOTE — Progress Notes (Signed)
Pt was uncomfortable with the scope advancement to the cecum.  Medications were titrated per the MD's orders.  Pt relaxed at the cecum and rested comfortably on the way out of the cecum.  She tolterated the exam well. MAW  Hung second bag of NS 0.9% 500 ml.  MAW

## 2010-12-20 ENCOUNTER — Telehealth: Payer: Self-pay | Admitting: *Deleted

## 2010-12-20 NOTE — Telephone Encounter (Signed)

## 2011-01-02 ENCOUNTER — Ambulatory Visit: Payer: Self-pay | Admitting: Family Medicine

## 2011-01-02 IMAGING — MG MAM DGTL SCREENING MAMMO W/CAD
1 series · 4 of 4 positions shown · non-contrast
Comparison: none

REASON FOR EXAM: scr
COMMENTS:

PROCEDURE:     MAM - MAM DGTL SCREENING MAMMO W/CAD  - [DATE]  [DATE]
RESULT:

[Series 2599: R CC · right · 4 of 4 slices shown]
[im 1/4]
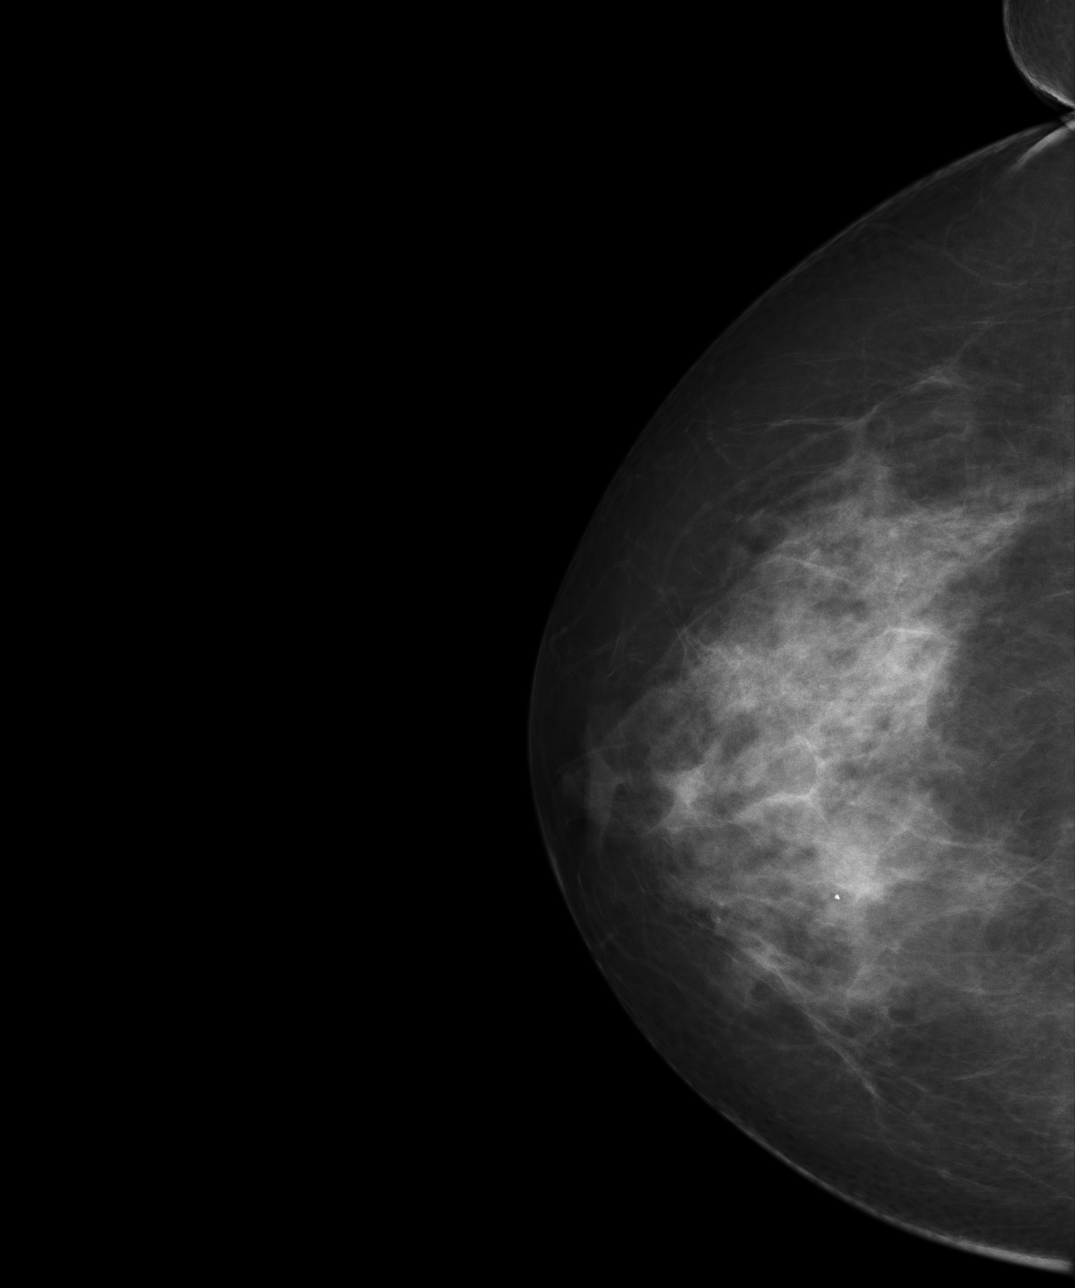
[im 2/4]
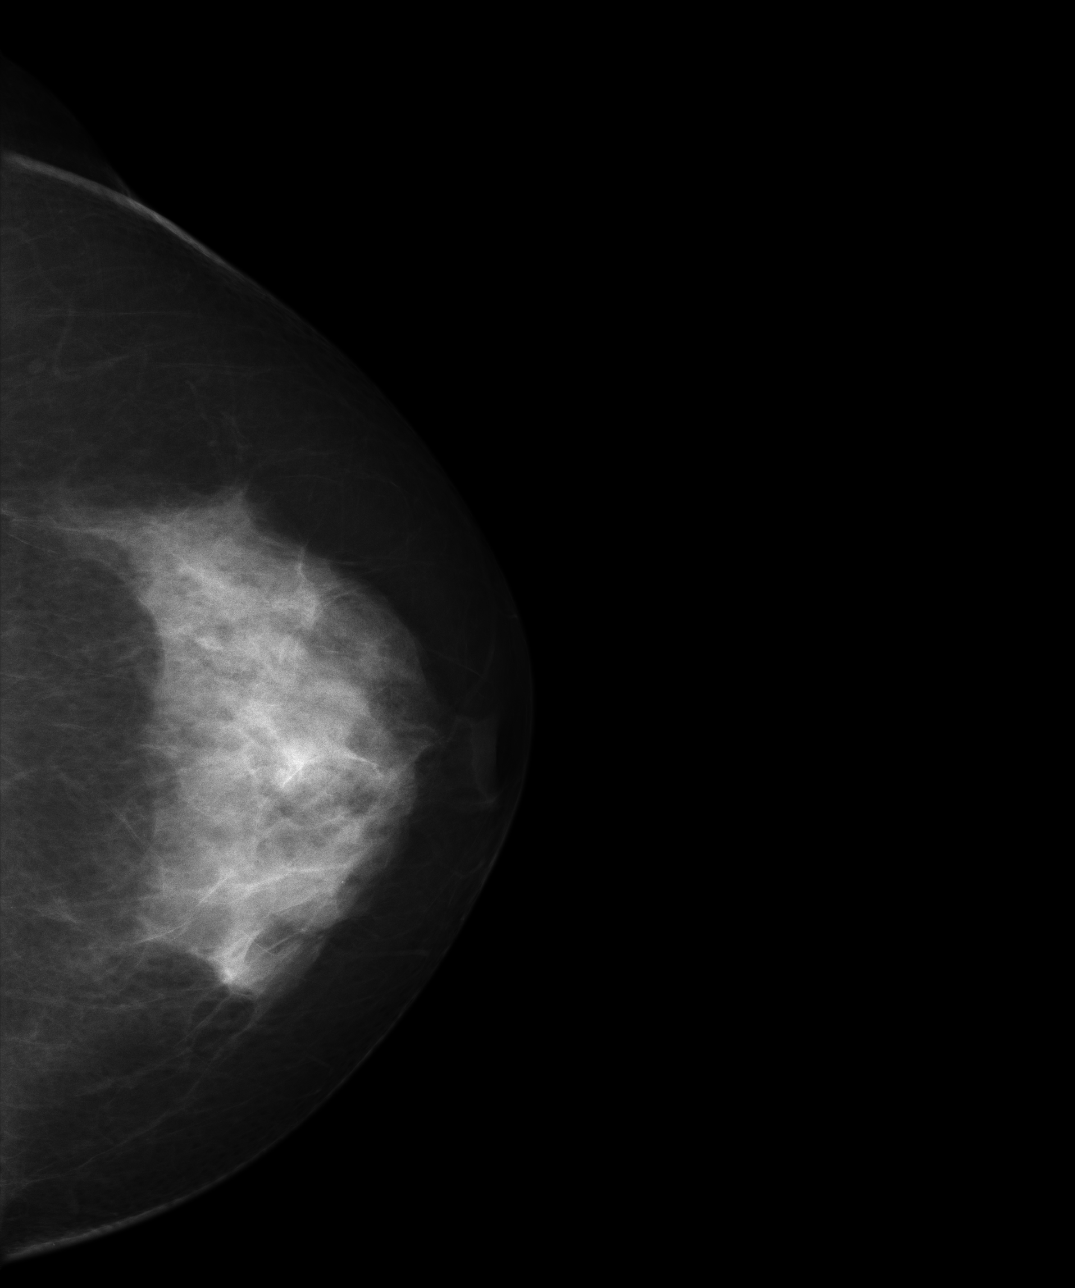
[im 3/4]
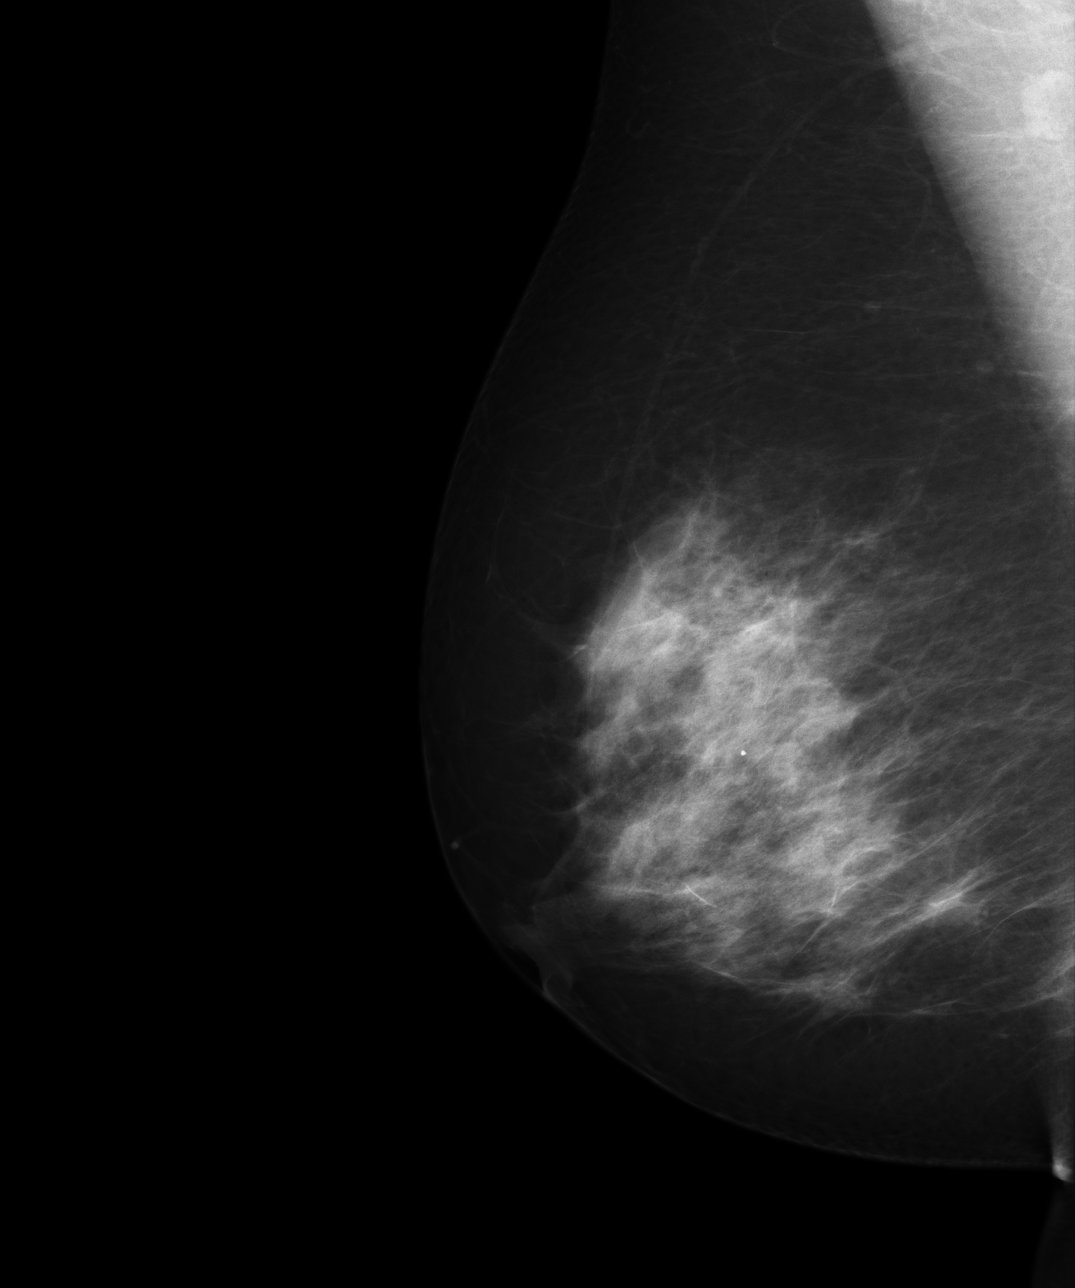
[im 4/4]
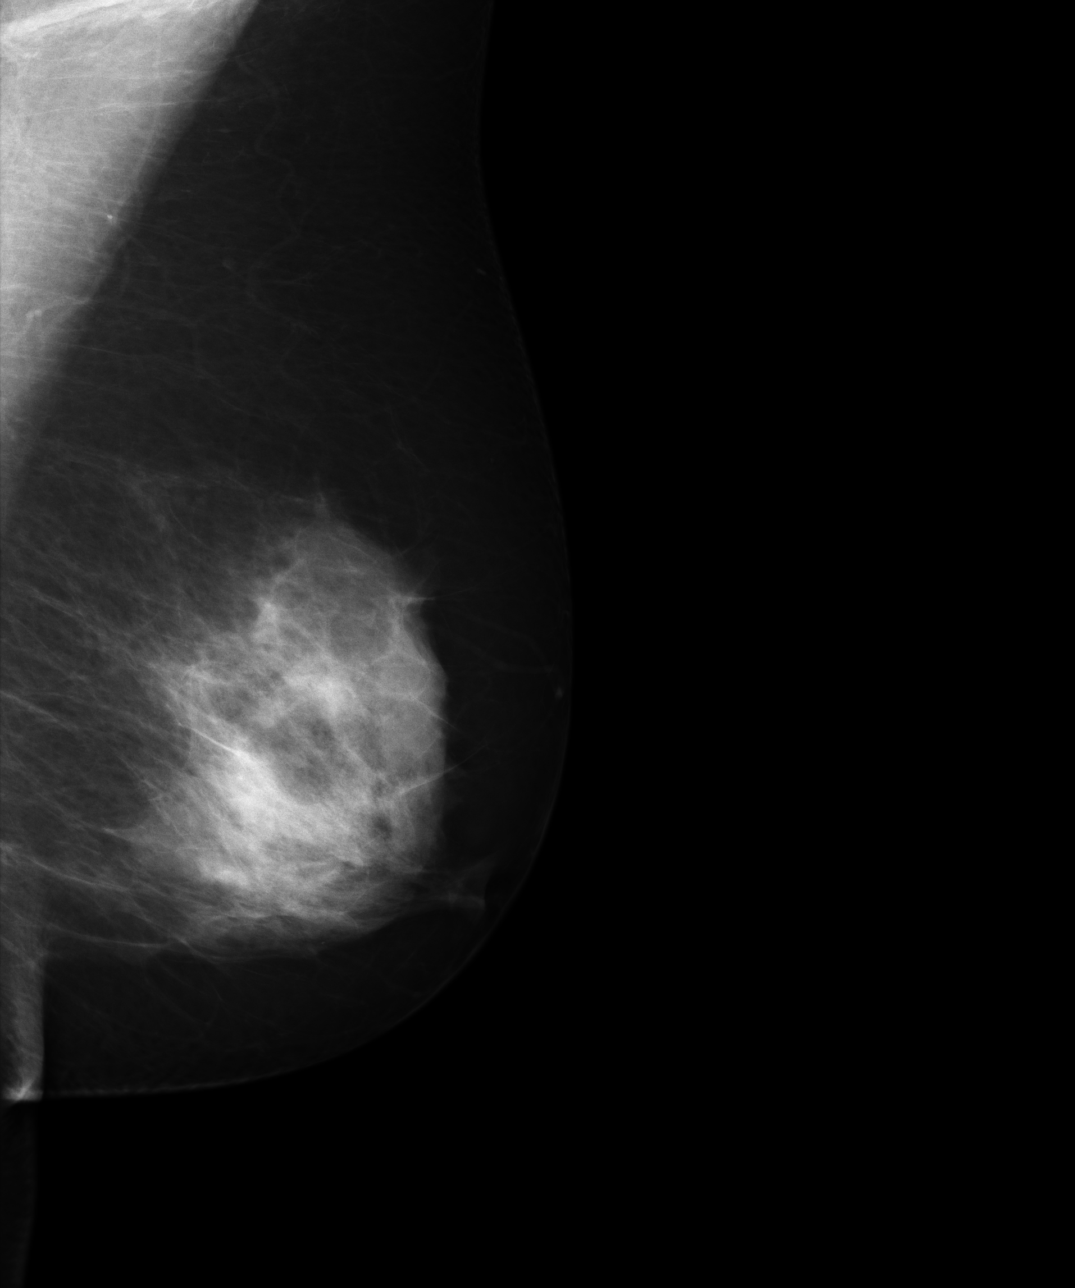

[4 of 4 positions shown; findings below may reference images not displayed]

FINDINGS: Comparison is made to previous analog images dated [DATE] from
YEK [HOSPITAL] in [REDACTED] with comparison also
made to images dated [DATE] as well as [DATE] and [DATE] from
[HOSPITAL].

The breasts exhibit a moderate to dense parenchymal pattern without a
definite area of developing parenchymal density or dominant mass. No
malignant appearing calcification or architectural distortion is present.
The appearance is stable.
IMPRESSION: Stable, benign appearing bilateral mammogram.

BI-RADS: Category 2 - Benign Findings

RECOMMENDATION:  Please continue to encourage annual mammographic follow-up.

Thank you for this opportunity to contribute to the care of your patient.

A NEGATIVE MAMMOGRAM REPORT DOES NOT PRECLUDE BIOPSY OR OTHER EVALUATION OF
A CLINICALLY PALPABLE OR OTHERWISE SUSPICIOUS MASS OR LESION. BREAST CANCER
MAY NOT BE DETECTED BY MAMMOGRAPHY IN UP TO 10% OF CASES.

## 2011-01-03 ENCOUNTER — Encounter: Payer: Self-pay | Admitting: Family Medicine

## 2011-01-04 ENCOUNTER — Encounter: Payer: Self-pay | Admitting: *Deleted

## 2011-01-07 ENCOUNTER — Ambulatory Visit (INDEPENDENT_AMBULATORY_CARE_PROVIDER_SITE_OTHER): Payer: MEDICARE | Admitting: Family Medicine

## 2011-01-07 ENCOUNTER — Encounter: Payer: Self-pay | Admitting: Family Medicine

## 2011-01-07 VITALS — BP 110/70 | HR 66 | Temp 97.7°F | Wt 155.5 lb

## 2011-01-07 DIAGNOSIS — M899 Disorder of bone, unspecified: Secondary | ICD-10-CM

## 2011-01-07 DIAGNOSIS — M8580 Other specified disorders of bone density and structure, unspecified site: Secondary | ICD-10-CM

## 2011-01-07 NOTE — Progress Notes (Signed)
Subjective:    Patient ID: Heather Potter, female    DOB: [birth year], 65 y.o.   MRN: [id number]  HPI 64 yo here to discuss bone density results from last month.  BMD of forearm radius T score of -1.1.  Femur and spine within normal limits.  Pt is very active, walks two miles per day. Has never had an abnormal DEXA scan.  No fractures.  Eats a lot of green leafy vegetables and fish.  Does not take a calcium supplement.  Patient Active Problem List  Diagnoses  . HYPOTHYROIDISM  . HYPERLIPIDEMIA  . HYPERTENSION  . OSTEOARTHRITIS  . NEPHRECTOMY, HX OF  . Routine general medical examination at a health care facility  . Osteopenia   Past Medical History  Diagnosis Date  . Hyperlipidemia   . Hypertension   . Hypothyroidism   . Osteoarthritis   . History of gynecologic surgery     had right ovary removed, unsure about uterus/cervix  . Osteopenia    Past Surgical History  Procedure Date  . Appendectomy   . Tubal ligation     bilat  . 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 Other surgical history     part of intestine removed  . Dilation and curettage of uterus   . Colon surgery 1998    colectomy for ? Diverticulits  . Hernia repair     right lower abd  . Oophorectomy     right   History  Substance Use Topics  . Smoking status: Former Smoker    Quit date: 05/21/2007  . Smokeless tobacco: Never Used  . Alcohol Use: Yes     rare- wine   Family History  Problem Relation Age of Onset  . Pancreatic cancer Mother   . Heart attack Father   . Liver cancer Brother    No Known Allergies Current Outpatient Prescriptions on File Prior to Visit  Medication Sig Dispense Refill  . acetaminophen (TYLENOL) 325 MG tablet Take 650 mg by mouth every 6 (six) hours as needed.        Marland Kitchen amLODipine (NORVASC) 10 MG tablet Take 10 mg by mouth daily.        Marland Kitchen aspirin 81 MG tablet Take 81 mg by mouth daily.        . fish oil-omega-3 fatty acids 1000 MG capsule Take 2 g by  mouth 2 (two) times daily.        . hydrochlorothiazide 25 MG tablet Take 25 mg by mouth daily.        Marland Kitchen levothyroxine (SYNTHROID, LEVOTHROID) 75 MCG tablet Take 75 mcg by mouth daily.        . Misc Natural Products (OSTEO BI-FLEX ADV DOUBLE ST PO) Take 1 tablet by mouth daily.        . naproxen sodium (ANAPROX) 220 MG tablet Take 440 mg by mouth as needed. pain       . simvastatin (ZOCOR) 20 MG tablet Take 10 mg by mouth at bedtime.        Current Facility-Administered Medications on File Prior to Visit  Medication Dose Route Frequency Provider Last Rate Last Dose  . 0.9 %  sodium chloride infusion  500 mL Intravenous Continuous Louis Meckel, MD       The PMH, PSH, Social History, Family History, Medications, and allergies have been reviewed in [facility], and have been updated if relevant.    Review of Systems See HPI    Objective:  Physical Exam BP 110/70  Pulse 66  Temp(Src) 97.7 F (36.5 C) (Oral)  Wt 155 lb 8 oz (70.534 kg) Gen:  Alert, pleasant, NAD Psych:  Not anxious or depressed appearing.  Good eye contact.    Assessment & Plan:   1. Osteopenia    New. >15 min spent with face to face with patient, >50% counseling and/or coordinating care. Will start Ca/Vit D supplement- in addition to diet. See pt instructions for details.

## 2011-01-07 NOTE — Patient Instructions (Addendum)
Calcium supplements have received some bad press lately, with questions that they may increase risk of heart attack or blood clots.  The risk is very low, however none of these risks occur with calcium in FOOD. Try to get most or all of your calcium from your food--aim for 1000 mg/day for women up to 50 and men up to 70 and 1200 mg/day for women over 50 and men over 70.  To figure out dietary calcium: 300 mg/day from all non dairy foods plus 300 mg per cup of milk, other dairy, or fortified juice.  Non dairy foods that contain calcium:  Kale, oranges, sardines, oatmeal, soy milk/soybeans, salmon, white beans, dried figs, turnip greens, almonds, broccoli, tofu.  Ok to add Calcium supplement as well- 1200 mg of calcium/400-800 IU of Vit D.

## 2011-08-09 ENCOUNTER — Other Ambulatory Visit: Payer: Self-pay

## 2011-08-09 MED ORDER — AMLODIPINE BESYLATE 10 MG PO TABS: 10.0000 mg | ORAL_TABLET | Freq: Every day | ORAL | Status: DC

## 2011-08-09 MED ORDER — SIMVASTATIN 20 MG PO TABS: 10.0000 mg | ORAL_TABLET | Freq: Every day | ORAL | Status: DC

## 2011-08-09 MED ORDER — HYDROCHLOROTHIAZIDE 25 MG PO TABS: 25.0000 mg | ORAL_TABLET | Freq: Every day | ORAL | Status: DC

## 2011-08-09 MED ORDER — LEVOTHYROXINE SODIUM 75 MCG PO TABS: 75.0000 ug | ORAL_TABLET | Freq: Every day | ORAL | Status: DC

## 2011-08-09 NOTE — Telephone Encounter (Signed)
Pt said had changed mail order pharmacy and needed written rx for Amlodipine 10 mg takes 1 daily,HCTZ 25 mg takes one daily,Levothyroxine 75 mcg takes one daily and Simvastatin 20 mg takes 1/2 tab at bedtime. Pt would like rx for 90 day supply with one yr refills. Pt can be reached at [phone] when rx ready for pick up.

## 2011-08-09 NOTE — Telephone Encounter (Signed)
Scripts printed, pt will pick up.

## 2011-08-09 NOTE — Telephone Encounter (Signed)
Ok to refill 

## 2012-01-13 ENCOUNTER — Other Ambulatory Visit: Payer: Self-pay | Admitting: *Deleted

## 2012-01-13 MED ORDER — TRAMADOL HCL 50 MG PO TABS: 50.0000 mg | ORAL_TABLET | Freq: Three times a day (TID) | ORAL | Status: DC | PRN

## 2012-01-13 NOTE — Telephone Encounter (Signed)
Tramadol refilled. Please take with food.

## 2012-01-13 NOTE — Telephone Encounter (Signed)
Pt is requesting refill on tramadol for hand pain.  States she hasn't had this for awhile, was taking it but stopped because she was having stomach problems, but now wants it back for the hand pain.  Not seen since 01/07/11, but has appt for this Thursday.  Uses [facility] in [place].

## 2012-01-16 ENCOUNTER — Ambulatory Visit (INDEPENDENT_AMBULATORY_CARE_PROVIDER_SITE_OTHER): Payer: MEDICARE | Admitting: Family Medicine

## 2012-01-16 ENCOUNTER — Telehealth: Payer: Self-pay

## 2012-01-16 ENCOUNTER — Encounter: Payer: Self-pay | Admitting: Family Medicine

## 2012-01-16 VITALS — BP 132/64 | HR 64 | Temp 98.1°F | Wt 152.0 lb

## 2012-01-16 DIAGNOSIS — K560 Paralytic ileus: Secondary | ICD-10-CM

## 2012-01-16 DIAGNOSIS — L259 Unspecified contact dermatitis, unspecified cause: Secondary | ICD-10-CM

## 2012-01-16 DIAGNOSIS — K567 Ileus, unspecified: Secondary | ICD-10-CM | POA: Insufficient documentation

## 2012-01-16 DIAGNOSIS — L309 Dermatitis, unspecified: Secondary | ICD-10-CM

## 2012-01-16 DIAGNOSIS — Z1231 Encounter for screening mammogram for malignant neoplasm of breast: Secondary | ICD-10-CM

## 2012-01-16 MED ORDER — TRAMADOL HCL 50 MG PO TABS: 50.0000 mg | ORAL_TABLET | Freq: Three times a day (TID) | ORAL | Status: DC | PRN

## 2012-01-16 NOTE — Telephone Encounter (Signed)
Pt left v/m was seen today and gave [facility] as mail order pharmacy; pt said is [facility]. Jacki Cones said same pharmacy. Pt notified by v/m.

## 2012-01-16 NOTE — Patient Instructions (Addendum)
Great to see you. Please stop by to see Heather Potter on your way out to set up your mammogram. If your symptoms return, please call us immediately.

## 2012-01-16 NOTE — Progress Notes (Signed)
Subjective:    Patient ID: Heather Potter, female    DOB: 21-Jul-1945, 66 y.o.   MRN: [id number]  HPI  Pleasant 66 yo here female here for hospital follow up.  Was seen while on vacation last month in [place] at [facility] on 12/09/11.  Notes reviewed.  Went to ER after one week of intermittent abdominal pain and loose stools. No nausea, vomiting or fever.  She did feel bloated.  H/o several previous abdominal surgeries- including partial colectomy, appendectomy, right salpingo oophorectomy, BTL and nephrectomy.  CT of abdomen pelvis consistent with small bowel ileus with no free air.  Labs were unremarkable except for mildly elevated WBC at 14.3 on admission, normalized to 7.7 prior to discharge.  NGT placed, bowel rest and discharged home next day.  Since returning home, no further GI complaints.  Also has a rash on left shin that has been there for several months, very itchy. Hydrocortisone not helping.  Patient Active Problem List  Diagnosis  . HYPOTHYROIDISM  . HYPERLIPIDEMIA  . HYPERTENSION  . OSTEOARTHRITIS  . NEPHRECTOMY, HX OF  . Routine general medical examination at a health care facility  . Osteopenia  . Ileus   Past Medical History  Diagnosis Date  . Hyperlipidemia   . Hypertension   . Hypothyroidism   . Osteoarthritis   . History of gynecologic surgery     had right ovary removed, unsure about uterus/cervix  . Osteopenia    Past Surgical History  Procedure Date  . Appendectomy   . Tubal ligation     bilat  . 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 Other surgical history     part of intestine removed  . Dilation and curettage of uterus   . Colon surgery 1998    colectomy for ? Diverticulits  . Hernia repair     right lower abd  . Oophorectomy     right   History  Substance Use Topics  . Smoking status: Former Smoker    Quit date: 05/21/2007  . Smokeless tobacco: Never Used  . Alcohol Use: Yes     rare-  wine   Family History  Problem Relation Age of Onset  . Pancreatic cancer Mother   . Heart attack Father   . Liver cancer Brother    No Known Allergies Current Outpatient Prescriptions on File Prior to Visit  Medication Sig Dispense Refill  . amLODipine (NORVASC) 10 MG tablet Take 1 tablet (10 mg total) by mouth daily.  90 tablet  3  . aspirin 81 MG tablet Take 81 mg by mouth daily.        . Calcium Carbonate-Vit D-Min (CALCIUM 1200 PO) Takes 1200 mg's by mouth daily      . hydrochlorothiazide (HYDRODIURIL) 25 MG tablet Take 1 tablet (25 mg total) by mouth daily.  90 tablet  3  . levothyroxine (SYNTHROID, LEVOTHROID) 75 MCG tablet Take 1 tablet (75 mcg total) by mouth daily.  90 tablet  3  . naproxen sodium (ANAPROX) 220 MG tablet Take 440 mg by mouth as needed. pain       . simvastatin (ZOCOR) 20 MG tablet Take 0.5 tablets (10 mg total) by mouth at bedtime.  45 tablet  3  . acetaminophen (TYLENOL) 325 MG tablet Take 650 mg by mouth every 6 (six) hours as needed.         Current Facility-Administered Medications on File Prior to Visit  Medication Dose Route Frequency Provider  Last Rate Last Dose  . 0.9 %  sodium chloride infusion  500 mL Intravenous Continuous Louis Meckel, MD       The PMH, PSH, Social History, Family History, Medications, and allergies have been reviewed in [facility], and have been updated if relevant.   Review of Systems See HPI   no N/V/D Objective:   Physical Exam  General:  Well-developed,well-nourished,in no acute distress; alert,appropriate and cooperative throughout examination Head:  normocephalic and atraumatic.   Eyes:  vision grossly intact, pupils equal, pupils round, and pupils reactive to light.   Ears:  R ear normal and L ear normal.   Nose:  no external deformity.   Abdomen:  Bowel sounds positive,abdomen soft and non-tender without masses, organomegaly or hernias noted. Msk:  No deformity or scoliosis noted of thoracic or lumbar spine.     Extremities:  No clubbing, cyanosis, edema, or deformity noted with normal full range of motion of all joints.   Neurologic:  alert & oriented X3 and gait normal.   Skin:  Raised plaques on left lower leg- erythematous Psych:  Cognition and judgment appear intact. Alert and cooperative with normal attention span and concentration. No apparent delusions, illusions, hallucinations        Assessment & Plan:   1. Dermatitis  Ambulatory referral to Dermatology  2. Ileus  New- resolved. Likely due to prior adhesions. No further w/u - has been asymptomatic for over a month. Call or return to clinic prn if these symptoms worsen or fail to improve as anticipated.

## 2012-01-29 ENCOUNTER — Ambulatory Visit: Payer: Self-pay | Admitting: Family Medicine

## 2012-01-29 ENCOUNTER — Encounter: Payer: Self-pay | Admitting: Family Medicine

## 2012-01-29 IMAGING — MG MM CAD SCREENING MAMMO
1 series · 7 of 7 positions shown · non-contrast
Comparison: none

REASON FOR EXAM: SCR MAMMO NO ORDER
COMMENTS:

PROCEDURE:     MAM - MAM DGTL SCRN MAM NO ORDER W/CAD  - [DATE] [DATE]
RESULT:     COMPARISON:  [DATE], [DATE], [DATE]
TECHNIQUE: Digital screening mammograms were obtained. FDA approved
computer-aided detection (CAD) for mammography was utilized for this study.

[R CC · right · 7 of 7 slices shown]
[im 1/7]
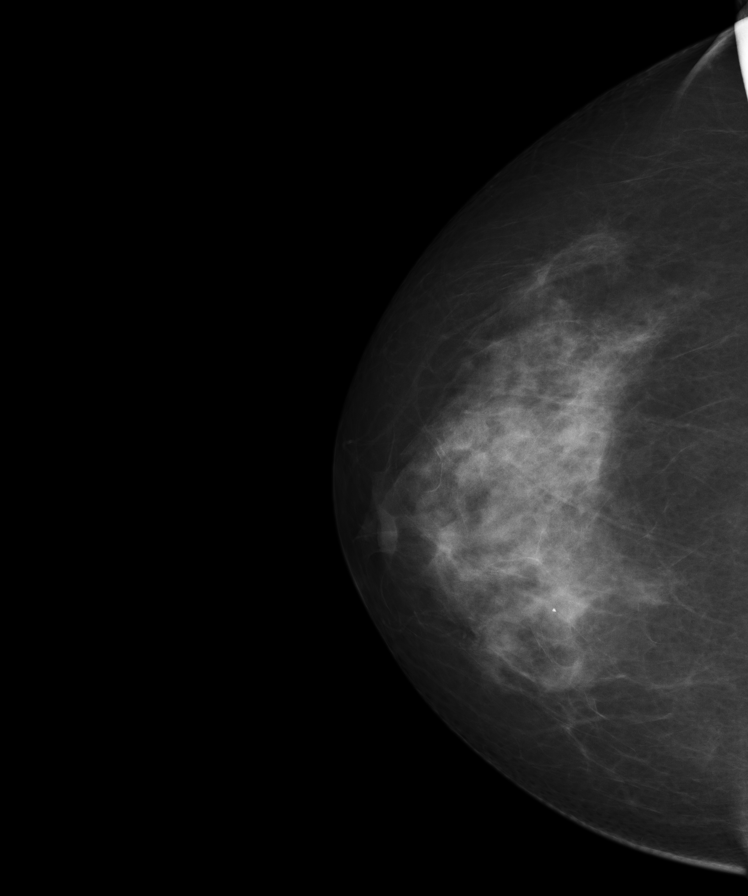
[im 2/7]
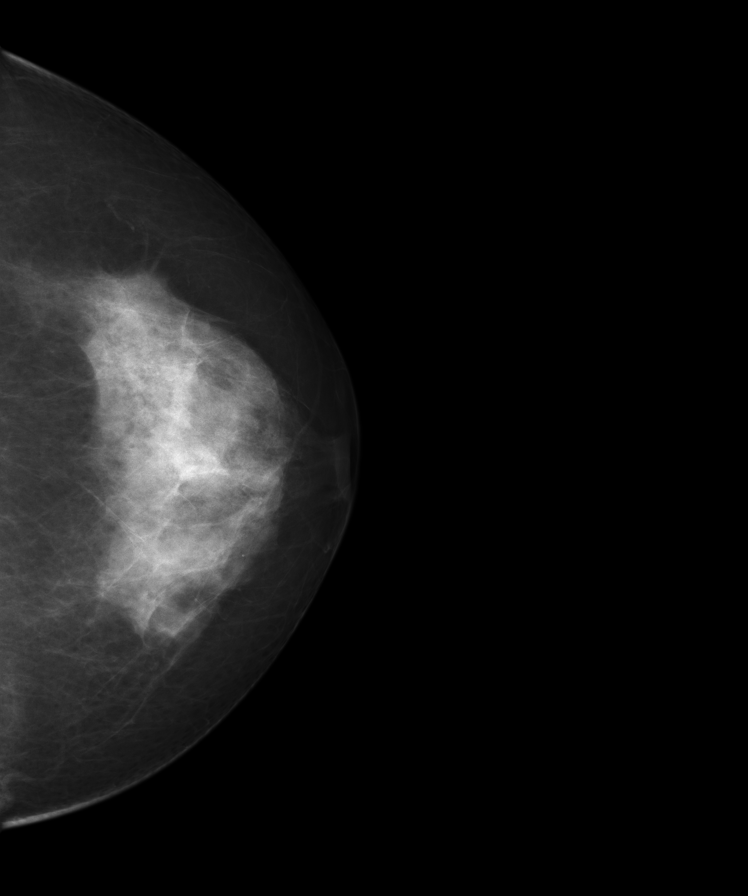
[im 3/7]
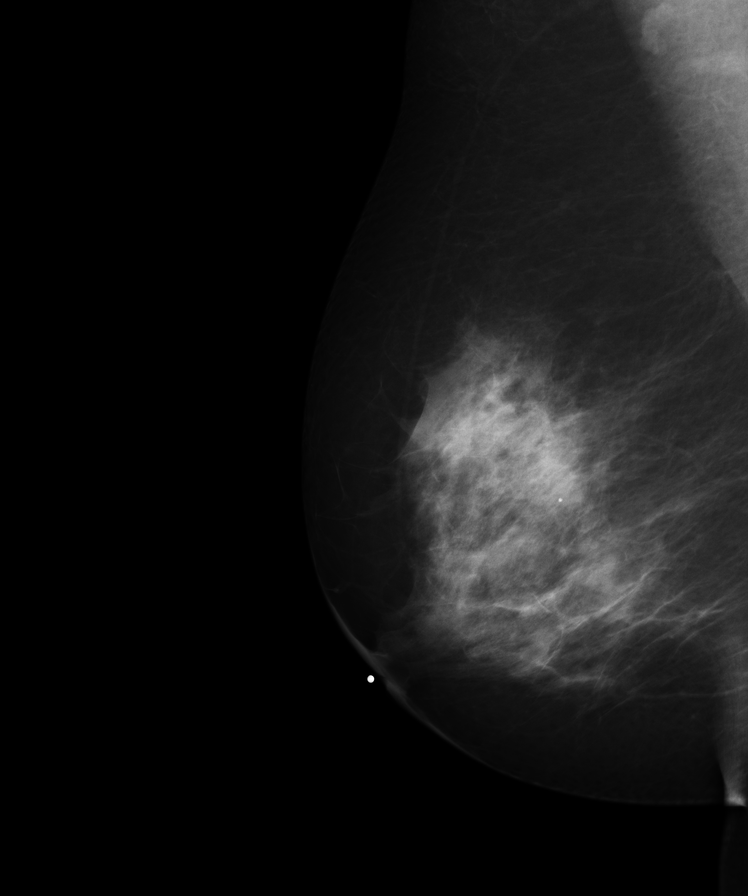
[im 4/7]
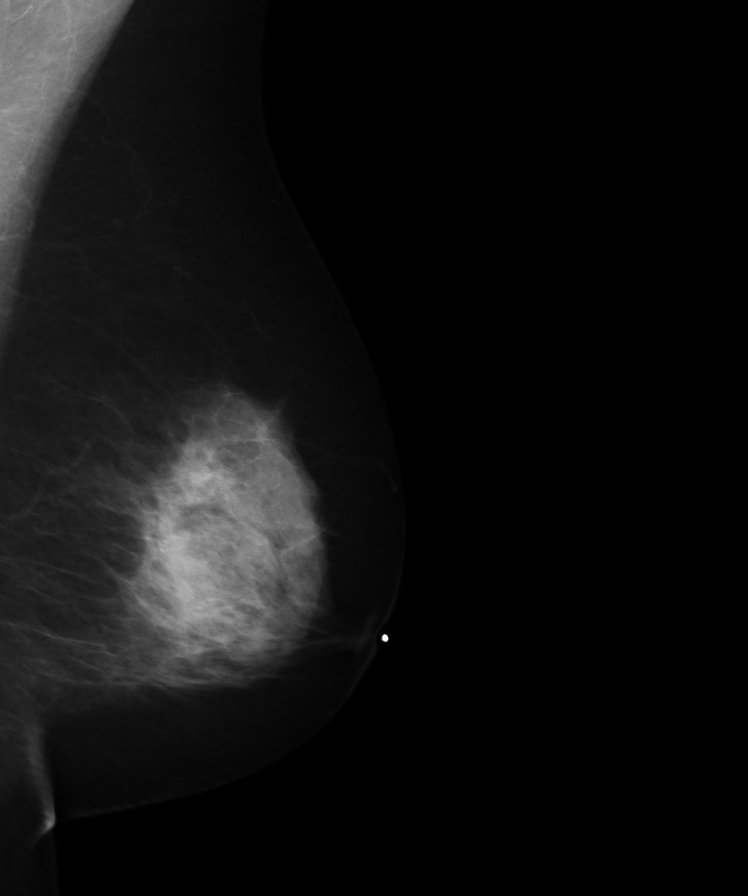
[im 5/7]
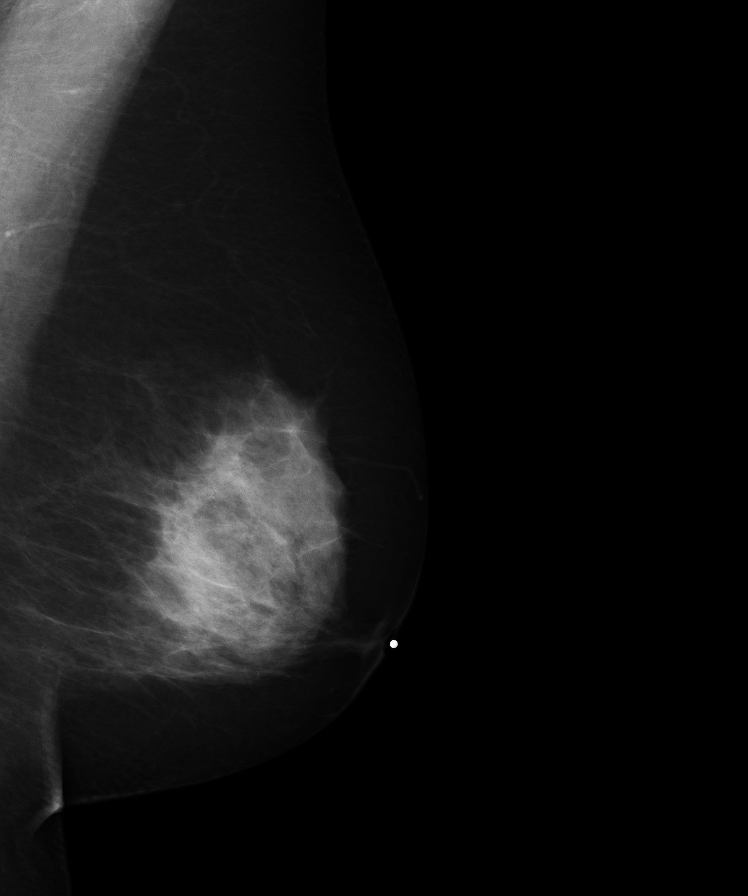
[im 6/7]
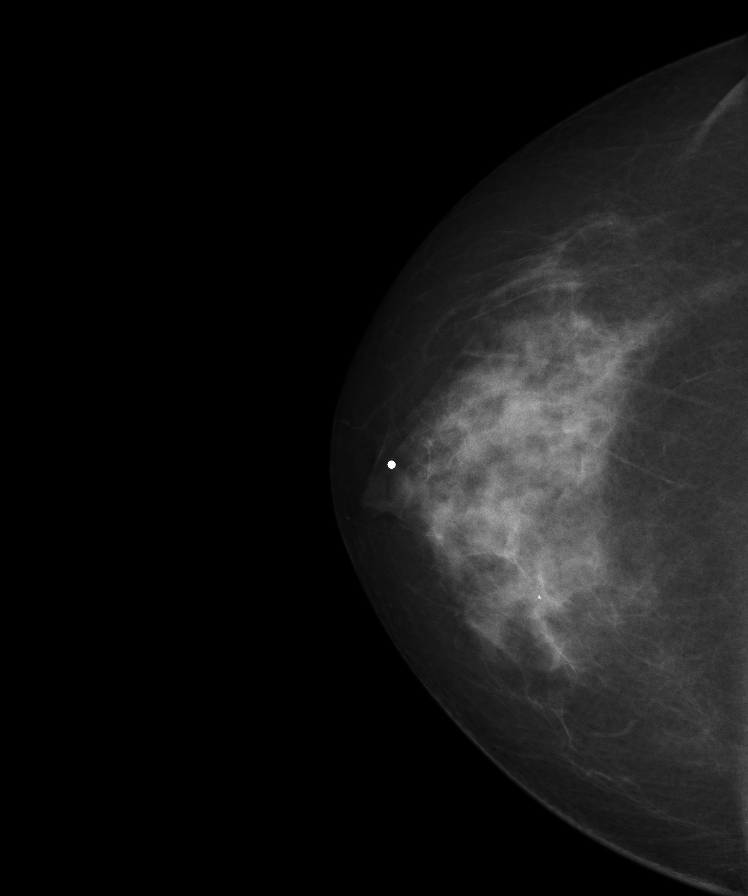
[im 7/7]
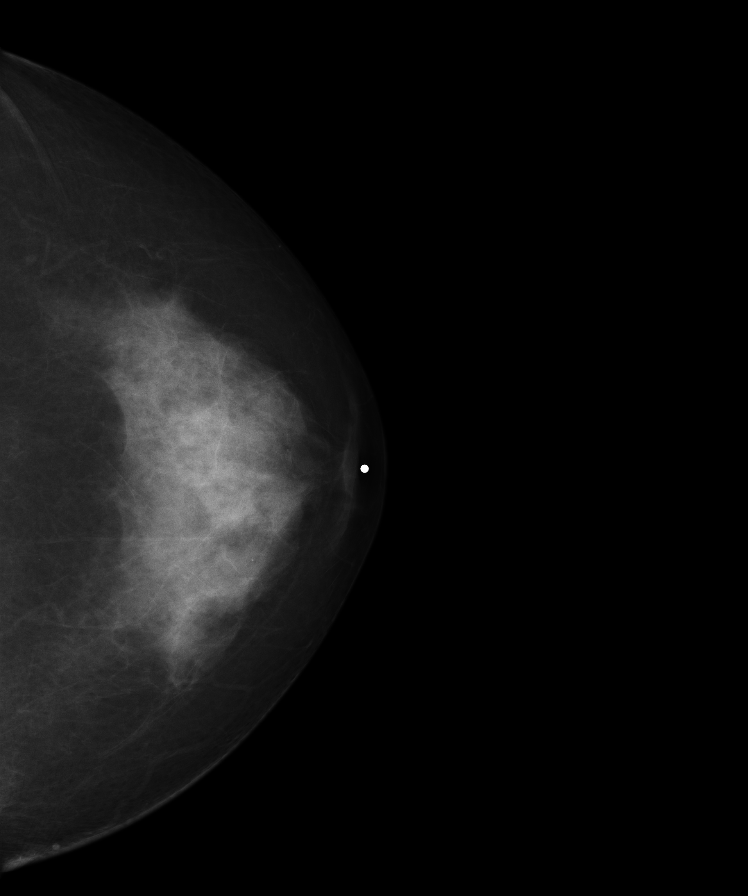

[7 of 7 positions shown; findings below may reference images not displayed]

FINDING: Bilateral breasts are heterogeneously dense which may lower the sensitivity
of mammography.  There is no dominant mass, architectural distortion or
clusters of suspicious microcalcifications.
IMPRESSION: 1.     Stable bilateral mammogram.
2.     Annual mammographic follow up recommended.
3.     BI-RADS:  Category 2- Benign.

A negative mammogram report does not preclude biopsy or other evaluation of
a clinically palpable or otherwise suspicious mass or lesion. Breast cancer
may not be detected by mammography in up to 10% of cases.

[REDACTED]

## 2012-01-30 ENCOUNTER — Encounter: Payer: Self-pay | Admitting: Family Medicine

## 2012-01-30 ENCOUNTER — Encounter: Payer: Self-pay | Admitting: *Deleted

## 2012-05-07 ENCOUNTER — Other Ambulatory Visit: Payer: Self-pay | Admitting: *Deleted

## 2012-05-07 MED ORDER — TRAMADOL HCL 50 MG PO TABS: 50.0000 mg | ORAL_TABLET | Freq: Three times a day (TID) | ORAL | Status: AC | PRN

## 2012-06-22 ENCOUNTER — Other Ambulatory Visit: Payer: Self-pay | Admitting: *Deleted

## 2012-06-22 MED ORDER — LEVOTHYROXINE SODIUM 75 MCG PO TABS: 75.0000 ug | ORAL_TABLET | Freq: Every day | ORAL | Status: DC

## 2012-06-22 MED ORDER — AMLODIPINE BESYLATE 10 MG PO TABS: 10.0000 mg | ORAL_TABLET | Freq: Every day | ORAL | Status: DC

## 2012-06-22 MED ORDER — SIMVASTATIN 20 MG PO TABS: 10.0000 mg | ORAL_TABLET | Freq: Every day | ORAL | Status: DC

## 2012-06-22 MED ORDER — HYDROCHLOROTHIAZIDE 25 MG PO TABS: 25.0000 mg | ORAL_TABLET | Freq: Every day | ORAL | Status: DC

## 2012-07-15 ENCOUNTER — Other Ambulatory Visit: Payer: Self-pay

## 2012-07-15 MED ORDER — TRAMADOL HCL 50 MG PO TABS: 50.0000 mg | ORAL_TABLET | Freq: Three times a day (TID) | ORAL | Status: AC | PRN

## 2012-07-15 NOTE — Telephone Encounter (Signed)
Pt left v/m requesting refill tramadol to [facility].Please advise.

## 2012-07-30 ENCOUNTER — Ambulatory Visit (INDEPENDENT_AMBULATORY_CARE_PROVIDER_SITE_OTHER): Payer: Medicare Other | Admitting: Family Medicine

## 2012-07-30 ENCOUNTER — Encounter: Payer: Self-pay | Admitting: Family Medicine

## 2012-07-30 VITALS — BP 136/76 | HR 68 | Temp 98.0°F

## 2012-07-30 DIAGNOSIS — M654 Radial styloid tenosynovitis [de Quervain]: Secondary | ICD-10-CM | POA: Insufficient documentation

## 2012-07-30 NOTE — Patient Instructions (Addendum)
Take the tramadol every 6 hours as needed and take tylenol up to 3 times a day for pain.  You can ice the area as needed.  Wear the brace as much as possible and then gently work on range of motion exercises out of the brace.   You can wean out of the brace when the pain is improved.   Take care.  If not improving, then let us know.

## 2012-07-30 NOTE — Assessment & Plan Note (Signed)
Anatomy d/w pt.  Placed in spica, less pain in brace.  D/w pt about ROM when out of brace.  Can use ice, tramadol, tylenol for pain.  Call back if not improved.  She agrees.  F/u prn.

## 2012-07-30 NOTE — Progress Notes (Signed)
L wrist pain.  Going on for 3 weeks.  Pain along the thumb extensor ligaments along the wrist.  No trigger, no trauma to start this.  Not consistent pain.  Pain with certain movements. 2-5th fingers are fine.  R handed.  She's been holding a baby more with the L hand.  No other new joint pains.    Meds, vitals, and allergies reviewed.   ROS: See HPI.  Otherwise, noncontributory.  nad L arm and hand with normal inspection.   Normal ROM at the L wrist.  ttp along the L thumb extensor tendons but not at any bony prominences. Distally nv intact and w/o tendon deficit but finklestein's test positive.  Chronic IP OA changes noted.

## 2012-10-20 ENCOUNTER — Telehealth: Payer: Self-pay

## 2012-10-20 DIAGNOSIS — M25539 Pain in unspecified wrist: Secondary | ICD-10-CM

## 2012-10-20 NOTE — Telephone Encounter (Signed)
Advised patient.  She has appt with ortho tomorrow in [place].

## 2012-10-20 NOTE — Telephone Encounter (Signed)
Pt seen 07/30/12 with lt wrist and thumb pain with swelling; pt said no real improvement since seen, using brace, ice and exercises. 2 weeks ago pain and swelling worsened in lt wrist and thumb; now pain is worse upon movement, pain level now is 8; pt is in [place] until end of July 2014 and requesting referral to Hand, Arm Wrist specialist in [place], [place];  Phone - [phone] and fax # [phone]. Pt request to make appt ASAP; would prefer appt today.Please advise. Pt prefers not to go to UC or ED unless cannot get appt with specialist.

## 2012-10-20 NOTE — Telephone Encounter (Signed)
Agree with Dr. Para March.  I can place referral but she needs to evaluated today.

## 2012-10-20 NOTE — Telephone Encounter (Signed)
Lugene- please call pt.  I would go to UC and get evaluated.  They can make the referral if needed.  I have no idea about the ability of the ortho clinic to see the patient today- I wouldn't delay the eval waiting on that.  If referral is needed, UC should be able to send her (along with any imaging, should that need to be done).  Routed to PCP as FYI.

## 2012-11-09 ENCOUNTER — Other Ambulatory Visit: Payer: Self-pay | Admitting: *Deleted

## 2012-11-09 MED ORDER — TRAMADOL HCL 50 MG PO TABS: 50.0000 mg | ORAL_TABLET | Freq: Four times a day (QID) | ORAL | Status: DC | PRN

## 2012-11-09 NOTE — Telephone Encounter (Signed)
Received fax refill request, they are requesting 90 day supply

## 2012-11-09 NOTE — Telephone Encounter (Signed)
Form from [facility] is on your desk, they are requesting 90 day supply of tramadol.  30 day supply was sent to them earlier today.

## 2012-11-26 ENCOUNTER — Other Ambulatory Visit: Payer: Self-pay

## 2012-11-26 MED ORDER — TRAMADOL HCL 50 MG PO TABS: 50.0000 mg | ORAL_TABLET | Freq: Four times a day (QID) | ORAL | Status: DC | PRN

## 2012-11-26 NOTE — Telephone Encounter (Signed)
Yes ok to change.  I saw the note but I thought directions were changed already.

## 2012-11-26 NOTE — Telephone Encounter (Signed)
Pt left v/m said does not need Tramadol q6h now but needs to take twice a day for pain. Pt request instructions changed to twice a day and sent to [facility].Please advise.

## 2012-11-26 NOTE — Telephone Encounter (Signed)
Please see note below.  Pt is asking for directions to be changed to one twice a day and she wants a 90 day supply sent to [facility].

## 2012-11-27 MED ORDER — TRAMADOL HCL 50 MG PO TABS: ORAL_TABLET | ORAL | Status: DC

## 2012-11-27 NOTE — Addendum Note (Signed)
Addended by: Eliezer Bottom on: 11/27/2012 08:41 AM   Modules accepted: Orders

## 2012-12-07 MED ORDER — TRAMADOL HCL 50 MG PO TABS: ORAL_TABLET | ORAL | Status: DC

## 2012-12-07 NOTE — Addendum Note (Signed)
Addended by: Eliezer Bottom on: 12/07/2012 05:18 PM   Modules accepted: Orders

## 2012-12-07 NOTE — Telephone Encounter (Signed)
Pt left v/m stating that [facility] has been trying to get in touch with our office for Tramadol refill without success.  Was this sent on 11/26/12?

## 2012-12-07 NOTE — Telephone Encounter (Signed)
This was sent to [facility]

## 2012-12-07 NOTE — Telephone Encounter (Signed)
See note below.  Left message on patient's voice mail advising her that this has been taken care of.

## 2012-12-07 NOTE — Telephone Encounter (Signed)
This was sent electronically to [facility] on 11/27/12.  It was also faxed to them on 7/17.  They said they got neither one.  New script called to them today.

## 2012-12-31 ENCOUNTER — Other Ambulatory Visit: Payer: Self-pay | Admitting: *Deleted

## 2012-12-31 MED ORDER — SIMVASTATIN 20 MG PO TABS: 10.0000 mg | ORAL_TABLET | Freq: Every day | ORAL | Status: DC

## 2012-12-31 MED ORDER — LEVOTHYROXINE SODIUM 75 MCG PO TABS: 75.0000 ug | ORAL_TABLET | Freq: Every day | ORAL | Status: DC

## 2012-12-31 MED ORDER — AMLODIPINE BESYLATE 10 MG PO TABS: 10.0000 mg | ORAL_TABLET | Freq: Every day | ORAL | Status: DC

## 2012-12-31 MED ORDER — HYDROCHLOROTHIAZIDE 25 MG PO TABS: 25.0000 mg | ORAL_TABLET | Freq: Every day | ORAL | Status: DC

## 2013-02-23 ENCOUNTER — Ambulatory Visit: Payer: Self-pay | Admitting: Family Medicine

## 2013-02-23 ENCOUNTER — Encounter: Payer: Self-pay | Admitting: Family Medicine

## 2013-02-23 IMAGING — MG MM DIGITAL SCREENING BILAT W/ CAD
1 series · 4 of 4 positions shown · non-contrast
Comparison: Prior studies dating back to [DATE]

REASON FOR EXAM: SCR MAMMO NO ORDER
COMMENTS:

PROCEDURE:     MAM - MAM DGTL SCRN MAM NO ORDER W/CAD  - [DATE]  [DATE]
CLINICAL DATA: Screening.
DIGITAL SCREENING BILATERAL MAMMOGRAM WITH CAD

[R CC · right · 4 of 4 slices shown]
[im 1/4]
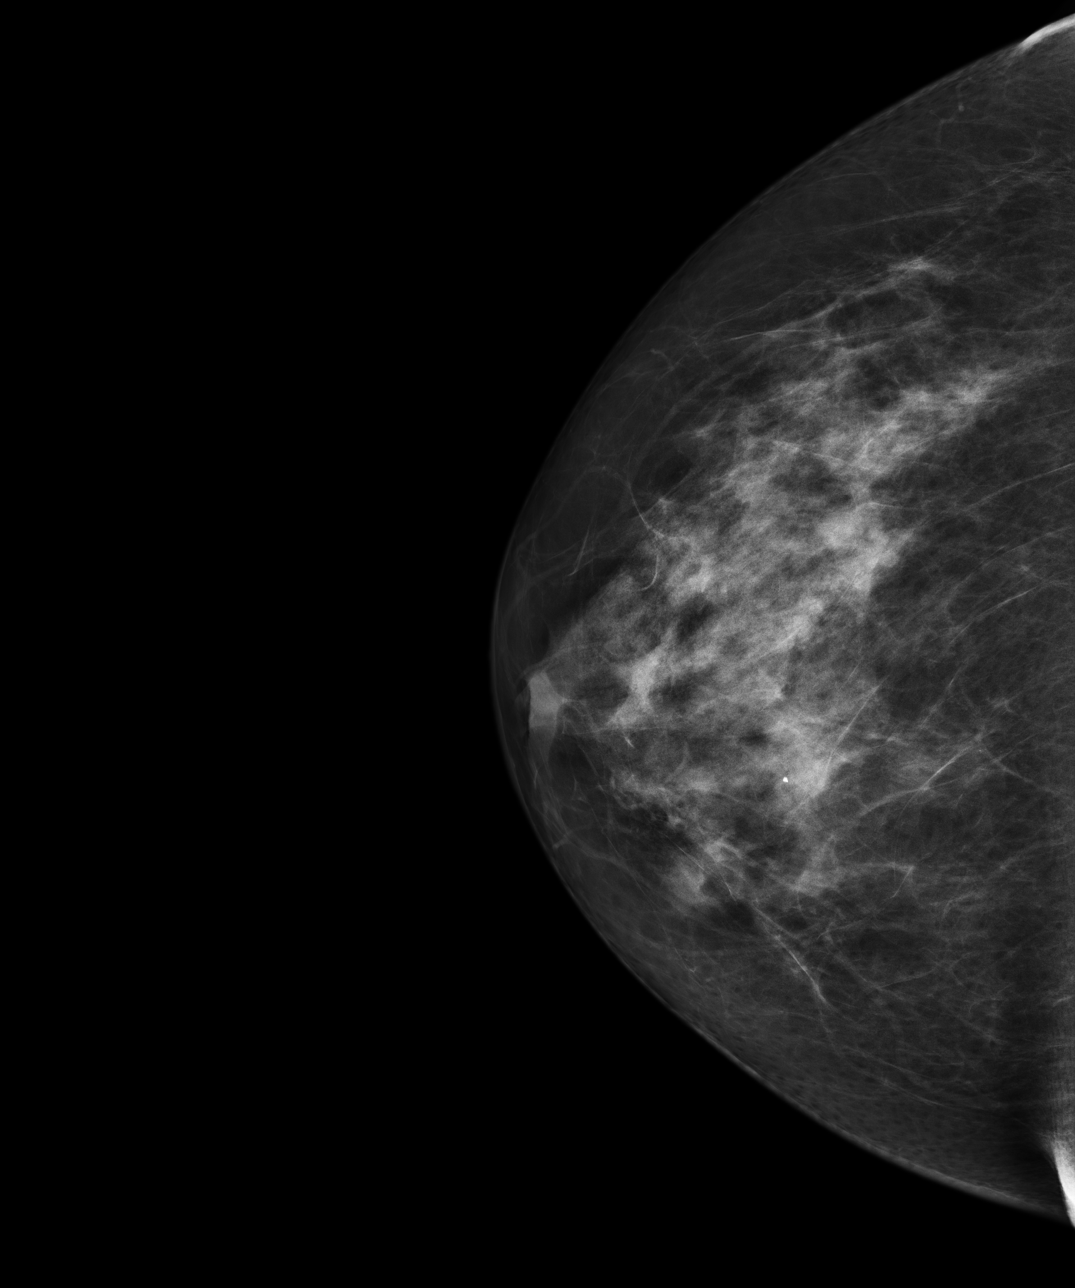
[im 2/4]
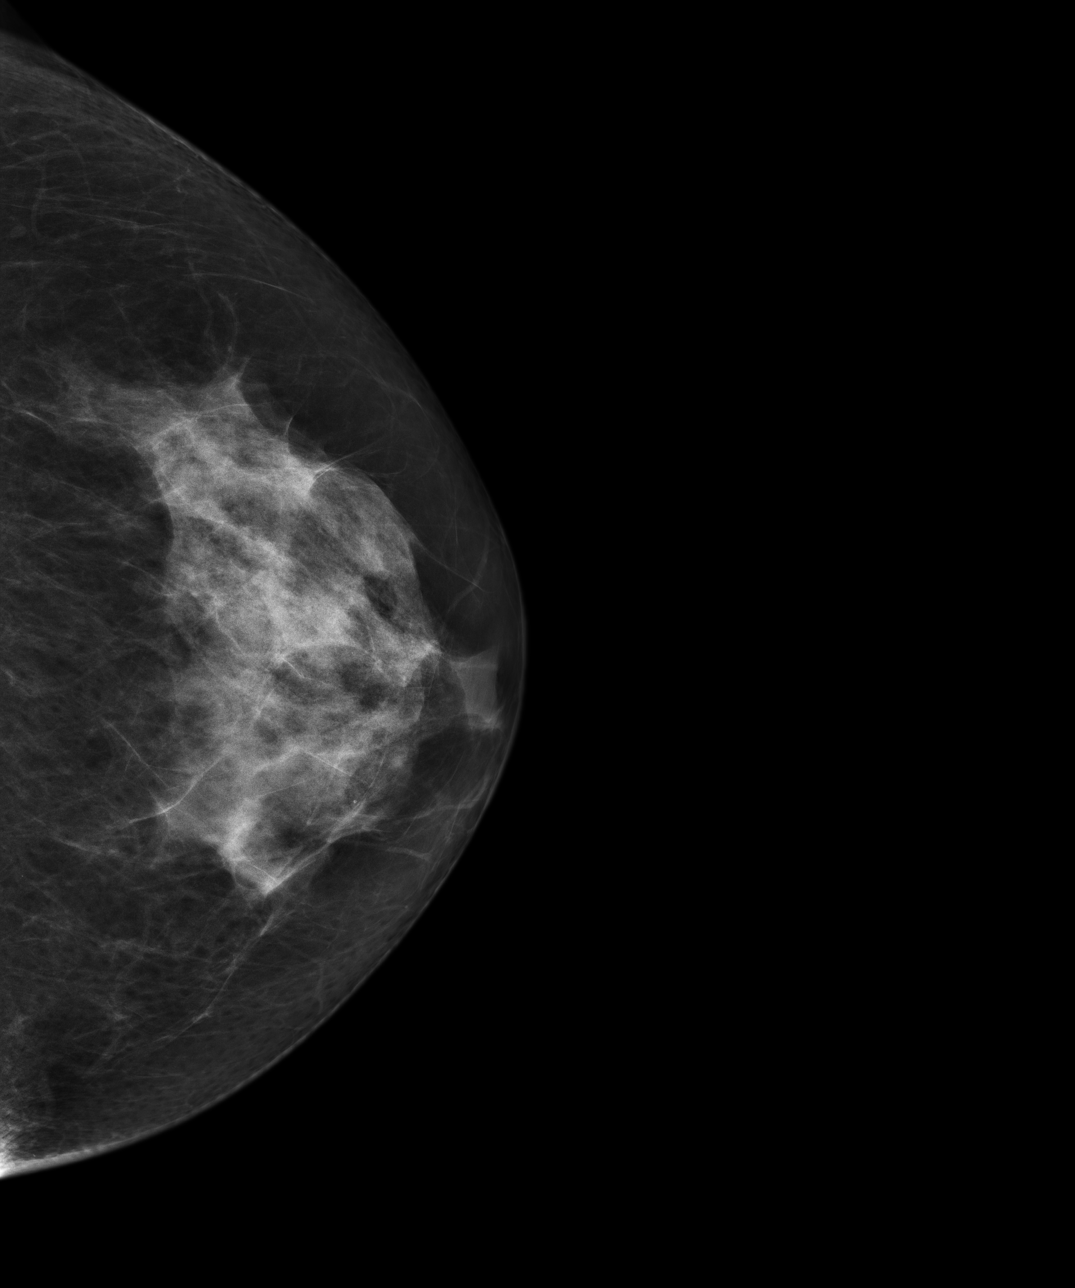
[im 3/4]
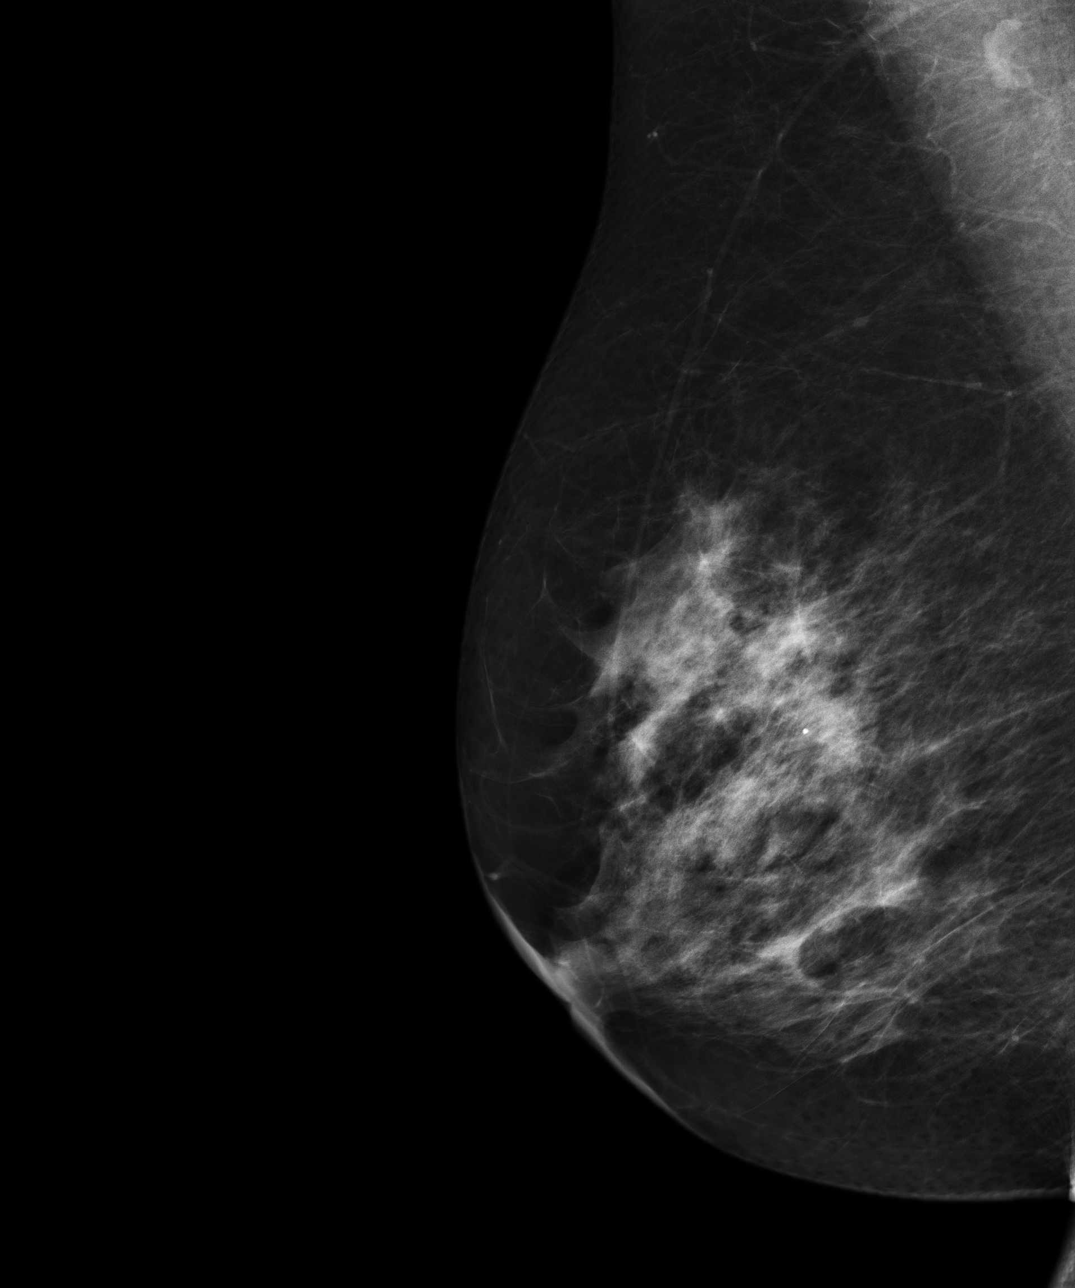
[im 4/4]
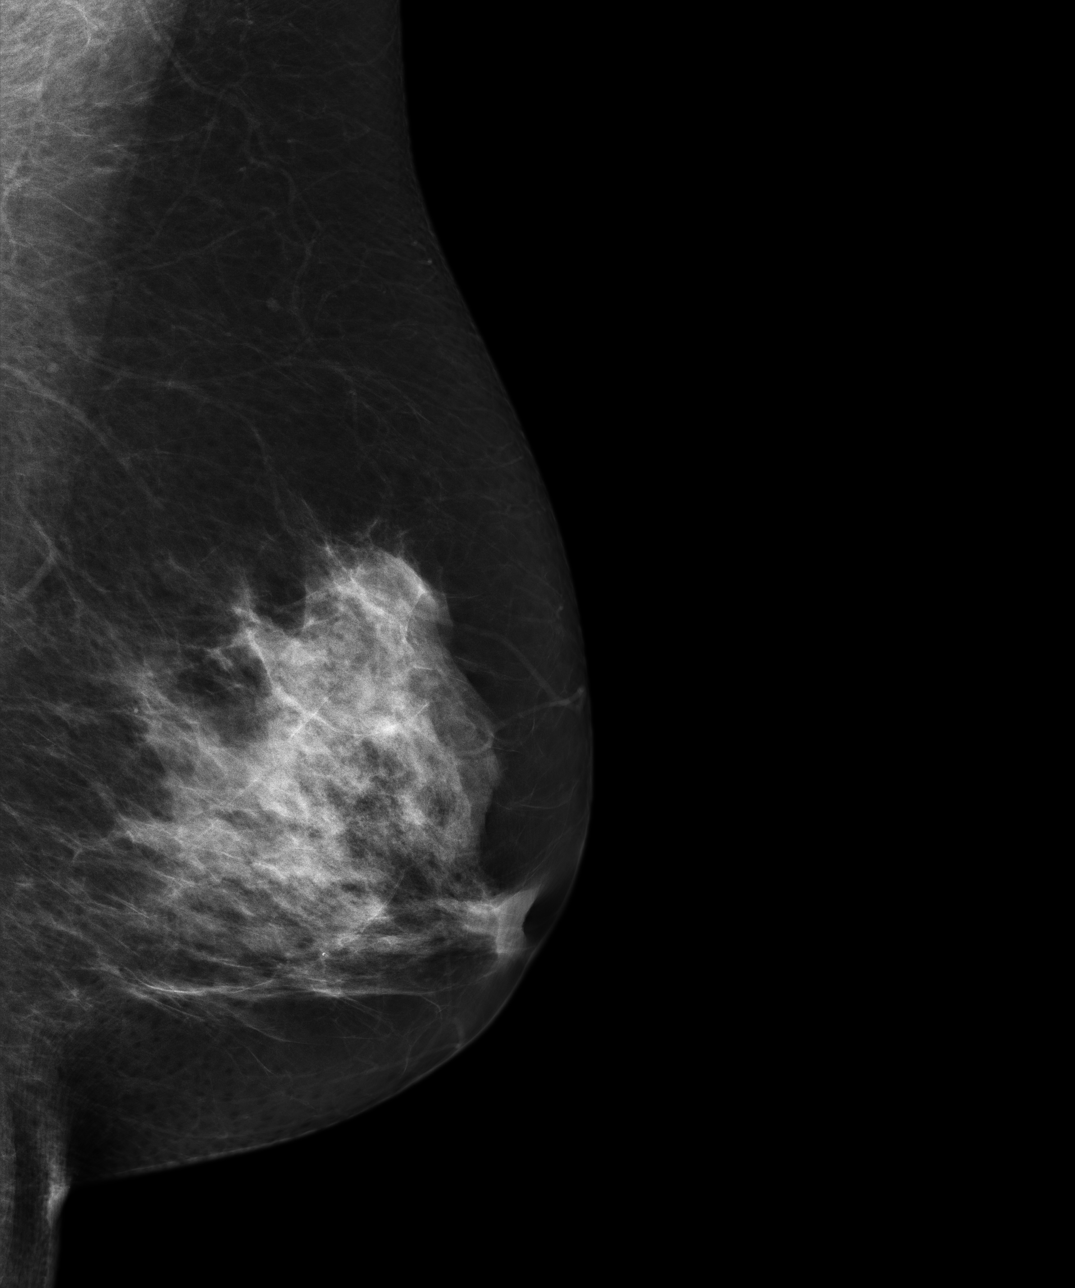

[4 of 4 positions shown; findings below may reference images not displayed]

FINDINGS: ACR Breast Density Category c:  The breast tissue is
heterogeneously dense, which may obscure small masses.

There is no suspicious dominant mass, architectural distortion, or
calcification to suggest malignancy.

Images were processed with CAD.
IMPRESSION: No mammographic evidence of malignancy.

A result letter of this screening mammogram will be mailed directly
to the patient.

RECOMMENDATION:
Screening mammogram in one year. (Code:[D1])

BI-RADS CATEGORY 1:  Negative.

## 2013-03-18 ENCOUNTER — Encounter: Payer: Self-pay | Admitting: Family Medicine

## 2013-03-18 ENCOUNTER — Ambulatory Visit (INDEPENDENT_AMBULATORY_CARE_PROVIDER_SITE_OTHER): Payer: Medicare Other | Admitting: Family Medicine

## 2013-03-18 VITALS — BP 126/70 | HR 70 | Temp 98.2°F | Ht 63.25 in | Wt 147.2 lb

## 2013-03-18 DIAGNOSIS — M7501 Adhesive capsulitis of right shoulder: Secondary | ICD-10-CM

## 2013-03-18 DIAGNOSIS — M7500 Adhesive capsulitis of unspecified shoulder: Secondary | ICD-10-CM

## 2013-03-18 NOTE — Patient Instructions (Signed)
F/u 2 months 

## 2013-03-18 NOTE — Progress Notes (Signed)
Date:  03/18/2013   Name:  Heather Potter   DOB:  10/09/1945   MRN:  [id number] Gender: female Age: 67 y.o.  Primary Physician:  Ruthe Mannan, MD   Chief Complaint: Shoulder Pain   History of Present Illness:  Heather Potter is a 67 y.o. very pleasant female patient who presents with the following:  Right shoulder, has been bothering her for about two months and now can barely move it up. It is not quite as bad. No injury - pulled lawnmower about 6 months ago, but it did not hurt back then.  The patient noted above presents with shoulder pain that has been ongoing for 2 months.  The patient denies neck pain or radicular symptoms. Denies dislocation, subluxation, separation of the shoulder.  Medications Tried: alleve, tramadol, tylenol Ice or Heat: yes Tried PT: No  Prior shoulder Injury: No Prior surgery: No Prior fracture: No  Past Medical History, Surgical History, Social History, Family History, Problem List, Medications, and Allergies have been reviewed and updated if relevant.  Current Outpatient Prescriptions on File Prior to Visit  Medication Sig Dispense Refill  . acetaminophen (TYLENOL) 325 MG tablet Take 650 mg by mouth every 6 (six) hours as needed.        Marland Kitchen amLODipine (NORVASC) 10 MG tablet Take 1 tablet (10 mg total) by mouth daily.  90 tablet  0  . aspirin 81 MG tablet Take 81 mg by mouth daily.        . Calcium Carbonate-Vit D-Min (CALCIUM 1200 PO) Takes 1200 mg's by mouth daily      . hydrochlorothiazide (HYDRODIURIL) 25 MG tablet Take 1 tablet (25 mg total) by mouth daily.  90 tablet  0  . levothyroxine (SYNTHROID, LEVOTHROID) 75 MCG tablet Take 1 tablet (75 mcg total) by mouth daily.  90 tablet  0  . naproxen sodium (ANAPROX) 220 MG tablet Take 440 mg by mouth as needed. pain       . simvastatin (ZOCOR) 20 MG tablet Take 0.5 tablets (10 mg total) by mouth at bedtime.  45 tablet  0  . traMADol (ULTRAM) 50 MG tablet Take one tablet by mouth two times a day   180 tablet  0   No current facility-administered medications on file prior to visit.    Review of Systems:  GEN: No fevers, chills. Nontoxic. Primarily MSK c/o today. MSK: Detailed in the HPI GI: tolerating PO intake without difficulty Neuro: No numbness, parasthesias, or tingling associated. Otherwise the pertinent positives of the ROS are noted above.    Physical Examination: BP 126/70  Pulse 70  Temp(Src) 98.2 F (36.8 C) (Oral)  Ht 5' 3.25" (1.607 m)  Wt 147 lb 4 oz (66.792 kg)  BMI 25.86 kg/m2   GEN: WDWN, NAD, Non-toxic, Alert & Oriented x 3 HEENT: Atraumatic, Normocephalic.  Ears and Nose: No external deformity. EXTR: No clubbing/cyanosis/edema NEURO: Normal gait.  PSYCH: Normally interactive. Conversant. Not depressed or anxious appearing.  Calm demeanor.    R Shoulder: flexion to 80, abd to 80, minimal IROM, EROM loss of 40 approx to relative side str 5/5 All special testing equivocal Ac nt Speeds and yergason's negative  Assessment and Plan:  Adhesive capsulitis of shoulder, right  adhesive capsulitis  Patient was given a systematic ROM protocol from [facility] to be done daily. Emphasized that without adherence to HEP, likely will not get better.  Tylenol or NSAID of choice prn for pain relief  Patient will be sent for formal PT  for aggressive frozen shoulder ROM: limited financially and cannot go Will need RTC str and scapular stabilization to fix underlying mechanics.  Intrarticular Shoulder Injection, RIGHT Verbal consent was obtained from the patient. Risks including infection explained and contrasted with benefits and alternatives. Patient prepped with Chloraprep and Ethyl Chloride used for anesthesia. An intraarticular shoulder injection was performed using the posterior approach. The patient tolerated the procedure well and had decreased pain post injection. No complications. Injection: 8 cc of Lidocaine 1% and 2 cc of Depo-Medrol 40 mg. Needle: 22  gauge  Patient Instructions  F/u 2 months   Updated Medication List:   Medication List       This list is accurate as of: 03/18/13 11:59 PM.  Always use your most recent med list.               acetaminophen 325 MG tablet  Commonly known as:  TYLENOL  Take 650 mg by mouth every 6 (six) hours as needed.     amLODipine 10 MG tablet  Commonly known as:  NORVASC  Take 1 tablet (10 mg total) by mouth daily.     aspirin 81 MG tablet  Take 81 mg by mouth daily.     CALCIUM 1200 PO  Takes 1200 mg's by mouth daily     hydrochlorothiazide 25 MG tablet  Commonly known as:  HYDRODIURIL  Take 1 tablet (25 mg total) by mouth daily.     levothyroxine 75 MCG tablet  Commonly known as:  SYNTHROID, LEVOTHROID  Take 1 tablet (75 mcg total) by mouth daily.     naproxen sodium 220 MG tablet  Commonly known as:  ANAPROX  Take 440 mg by mouth as needed. pain     simvastatin 20 MG tablet  Commonly known as:  ZOCOR  Take 0.5 tablets (10 mg total) by mouth at bedtime.     traMADol 50 MG tablet  Commonly known as:  ULTRAM  Take one tablet by mouth two times a day          Signed,  Alden Bensinger T. Jairy Angulo, MD, CAQ Sports Medicine  [employer] at [facility] [address] [place] [address] Phone: [phone] Fax: [phone]

## 2013-04-05 ENCOUNTER — Other Ambulatory Visit: Payer: Self-pay | Admitting: Family Medicine

## 2013-04-05 NOTE — Telephone Encounter (Signed)
Fax refill request.

## 2013-04-06 ENCOUNTER — Other Ambulatory Visit: Payer: Self-pay | Admitting: *Deleted

## 2013-04-06 MED ORDER — TRAMADOL HCL 50 MG PO TABS: ORAL_TABLET | ORAL | Status: DC

## 2013-04-06 MED ORDER — AMLODIPINE BESYLATE 10 MG PO TABS: 10.0000 mg | ORAL_TABLET | Freq: Every day | ORAL | Status: DC

## 2013-04-06 MED ORDER — LEVOTHYROXINE SODIUM 75 MCG PO TABS: 75.0000 ug | ORAL_TABLET | Freq: Every day | ORAL | Status: DC

## 2013-04-06 MED ORDER — HYDROCHLOROTHIAZIDE 25 MG PO TABS: 25.0000 mg | ORAL_TABLET | Freq: Every day | ORAL | Status: DC

## 2013-04-06 MED ORDER — SIMVASTATIN 20 MG PO TABS: 10.0000 mg | ORAL_TABLET | Freq: Every day | ORAL | Status: DC

## 2013-04-06 NOTE — Telephone Encounter (Signed)
Ok to phone in.

## 2013-04-06 NOTE — Telephone Encounter (Signed)
Faxed to [phone].

## 2013-05-21 ENCOUNTER — Encounter: Payer: Self-pay | Admitting: Radiology

## 2013-05-24 ENCOUNTER — Encounter: Payer: Medicare Other | Admitting: Family Medicine

## 2013-05-26 ENCOUNTER — Ambulatory Visit: Payer: Medicare Other | Admitting: Family Medicine

## 2013-06-07 ENCOUNTER — Encounter: Payer: Medicare Other | Admitting: Family Medicine

## 2013-06-14 ENCOUNTER — Ambulatory Visit: Payer: Medicare Other | Admitting: Family Medicine

## 2013-06-29 ENCOUNTER — Telehealth: Payer: Self-pay

## 2013-06-29 NOTE — Telephone Encounter (Signed)
Pt left v/m; pt wants mail order pharmacy [facility] to [facility] and [facility] will contact office for refills when needed. Pt did not require cb.

## 2013-07-01 ENCOUNTER — Other Ambulatory Visit: Payer: Self-pay | Admitting: *Deleted

## 2013-07-01 MED ORDER — AMLODIPINE BESYLATE 10 MG PO TABS: 10.0000 mg | ORAL_TABLET | Freq: Every day | ORAL | Status: DC

## 2013-07-01 MED ORDER — LEVOTHYROXINE SODIUM 75 MCG PO TABS: 75.0000 ug | ORAL_TABLET | Freq: Every day | ORAL | Status: DC

## 2013-07-01 MED ORDER — SIMVASTATIN 20 MG PO TABS: 10.0000 mg | ORAL_TABLET | Freq: Every day | ORAL | Status: DC

## 2013-07-01 MED ORDER — HYDROCHLOROTHIAZIDE 25 MG PO TABS: 25.0000 mg | ORAL_TABLET | Freq: Every day | ORAL | Status: DC

## 2013-07-01 NOTE — Addendum Note (Signed)
Addended by: Modena Nunnery on: 07/01/2013 08:58 AM   Modules accepted: Orders

## 2013-07-07 ENCOUNTER — Ambulatory Visit: Payer: Medicare Other | Admitting: Family Medicine

## 2013-07-08 ENCOUNTER — Other Ambulatory Visit: Payer: Self-pay | Admitting: *Deleted

## 2013-07-08 MED ORDER — TRAMADOL HCL 50 MG PO TABS: ORAL_TABLET | ORAL | Status: DC

## 2013-07-08 NOTE — Telephone Encounter (Signed)
Pt requesting medication refill. Last ov 12/2013 with future appt scheduled for 07/2013. pls advise

## 2013-07-09 MED ORDER — TRAMADOL HCL 50 MG PO TABS: ORAL_TABLET | ORAL | Status: DC

## 2013-07-09 NOTE — Telephone Encounter (Signed)
Rx printed to be faxed to [facility]

## 2013-07-19 ENCOUNTER — Encounter: Payer: Self-pay | Admitting: Family Medicine

## 2013-07-19 ENCOUNTER — Ambulatory Visit (INDEPENDENT_AMBULATORY_CARE_PROVIDER_SITE_OTHER): Payer: Commercial Managed Care - HMO | Admitting: Family Medicine

## 2013-07-19 VITALS — BP 110/70 | HR 68 | Temp 97.7°F | Ht 63.25 in | Wt 146.5 lb

## 2013-07-19 DIAGNOSIS — M7500 Adhesive capsulitis of unspecified shoulder: Secondary | ICD-10-CM

## 2013-07-19 DIAGNOSIS — M7501 Adhesive capsulitis of right shoulder: Secondary | ICD-10-CM

## 2013-07-19 NOTE — Progress Notes (Signed)
Date:  07/19/2013   Name:  Heather Potter   DOB:  09-24-1945   MRN:  [id number] Gender: female Age: 68 y.o.  Primary Physician:  Arnette Norris, MD   Chief Complaint: Follow-up   History of Present Illness:  Heather Potter is a 67 y.o. very pleasant female patient who presents with the following:  S/p intraarticular inj and diligent HEP, the patient is remarkably better now 4 months after my initial evaluation. OV delayed due to snow, ice. She feels well, has some mild loss of IROM only now.  03/18/2013 Last OV with Owens Loffler, MD Right shoulder, has been bothering her for about two months and now can barely move it up. It is not quite as bad. No injury - pulled lawnmower about 6 months ago, but it did not hurt back then.  The patient noted above presents with shoulder pain that has been ongoing for 2 months.  The patient denies neck pain or radicular symptoms. Denies dislocation, subluxation, separation of the shoulder.  Medications Tried: alleve, tramadol, tylenol Ice or Heat: yes Tried PT: No  Prior shoulder Injury: No Prior surgery: No Prior fracture: No  Past Medical History, Surgical History, Social History, Family History, Problem List, Medications, and Allergies have been reviewed and updated if relevant.  Current Outpatient Prescriptions on File Prior to Visit  Medication Sig Dispense Refill  . acetaminophen (TYLENOL) 325 MG tablet Take 650 mg by mouth every 6 (six) hours as needed.        Marland Kitchen amLODipine (NORVASC) 10 MG tablet Take 1 tablet (10 mg total) by mouth daily.  90 tablet  0  . aspirin 81 MG tablet Take 81 mg by mouth daily.        . Calcium Carbonate-Vit D-Min (CALCIUM 1200 PO) Takes 1200 mg's by mouth daily      . hydrochlorothiazide (HYDRODIURIL) 25 MG tablet Take 1 tablet (25 mg total) by mouth daily.  90 tablet  0  . levothyroxine (SYNTHROID, LEVOTHROID) 75 MCG tablet Take 1 tablet (75 mcg total) by mouth daily.  90 tablet  0  . naproxen sodium  (ANAPROX) 220 MG tablet Take 440 mg by mouth as needed. pain       . simvastatin (ZOCOR) 20 MG tablet Take 0.5 tablets (10 mg total) by mouth at bedtime.  45 tablet  0  . traMADol (ULTRAM) 50 MG tablet Take one tablet by mouth two times a day  180 tablet  0   No current facility-administered medications on file prior to visit.    Review of Systems:  GEN: No fevers, chills. Nontoxic. Primarily MSK c/o today. MSK: Detailed in the HPI GI: tolerating PO intake without difficulty Neuro: No numbness, parasthesias, or tingling associated. Otherwise the pertinent positives of the ROS are noted above.    Physical Examination: BP 110/70  Pulse 68  Temp(Src) 97.7 F (36.5 C) (Oral)  Ht 5' 3.25" (1.607 m)  Wt 146 lb 8 oz (66.452 kg)  BMI 25.73 kg/m2   GEN: WDWN, NAD, Non-toxic, Alert & Oriented x 3 HEENT: Atraumatic, Normocephalic.  Ears and Nose: No external deformity. EXTR: No clubbing/cyanosis/edema NEURO: Normal gait.  PSYCH: Normally interactive. Conversant. Not depressed or anxious appearing.  Calm demeanor.    R Shoulder: flexion to 180, abd to 180, mild loss at 90 abd -  IROM, EROM full str 5/5 All special testing neg Ac nt Speeds and yergason's negative  Assessment and Plan:  Adhesive capsulitis of right shoulder  Remarkably improved,  cont IROM stretches and f/u prn.  Updated Medication List:   Medication List       This list is accurate as of: 07/19/13  3:42 PM.  Always use your most recent med list.               acetaminophen 325 MG tablet  Commonly known as:  TYLENOL  Take 650 mg by mouth every 6 (six) hours as needed.     amLODipine 10 MG tablet  Commonly known as:  NORVASC  Take 1 tablet (10 mg total) by mouth daily.     aspirin 81 MG tablet  Take 81 mg by mouth daily.     CALCIUM 1200 PO  Takes 1200 mg's by mouth daily     hydrochlorothiazide 25 MG tablet  Commonly known as:  HYDRODIURIL  Take 1 tablet (25 mg total) by mouth daily.      levothyroxine 75 MCG tablet  Commonly known as:  SYNTHROID, LEVOTHROID  Take 1 tablet (75 mcg total) by mouth daily.     naproxen sodium 220 MG tablet  Commonly known as:  ANAPROX  Take 440 mg by mouth as needed. pain     simvastatin 20 MG tablet  Commonly known as:  ZOCOR  Take 0.5 tablets (10 mg total) by mouth at bedtime.     traMADol 50 MG tablet  Commonly known as:  ULTRAM  Take one tablet by mouth two times a day          Signed,  Sarp Vernier T. Muhanad Torosyan, MD, [place] at [facility] [place] [address] Phone: [phone] Fax: [phone]

## 2013-07-19 NOTE — Progress Notes (Signed)
Pre visit review using our clinic review tool, if applicable. No additional management support is needed unless otherwise documented below in the visit note. 

## 2013-07-28 ENCOUNTER — Other Ambulatory Visit: Payer: Self-pay | Admitting: Family Medicine

## 2013-07-28 DIAGNOSIS — I10 Essential (primary) hypertension: Secondary | ICD-10-CM

## 2013-07-28 DIAGNOSIS — E039 Hypothyroidism, unspecified: Secondary | ICD-10-CM

## 2013-07-28 DIAGNOSIS — Z0000 Encounter for general adult medical examination without abnormal findings: Secondary | ICD-10-CM

## 2013-07-28 DIAGNOSIS — E785 Hyperlipidemia, unspecified: Secondary | ICD-10-CM

## 2013-08-03 ENCOUNTER — Other Ambulatory Visit (INDEPENDENT_AMBULATORY_CARE_PROVIDER_SITE_OTHER): Payer: Commercial Managed Care - HMO

## 2013-08-03 ENCOUNTER — Telehealth: Payer: Self-pay | Admitting: *Deleted

## 2013-08-03 ENCOUNTER — Encounter: Payer: Self-pay | Admitting: Family Medicine

## 2013-08-03 DIAGNOSIS — Z0000 Encounter for general adult medical examination without abnormal findings: Secondary | ICD-10-CM

## 2013-08-03 DIAGNOSIS — E039 Hypothyroidism, unspecified: Secondary | ICD-10-CM

## 2013-08-03 DIAGNOSIS — E785 Hyperlipidemia, unspecified: Secondary | ICD-10-CM

## 2013-08-03 LAB — CBC WITH DIFFERENTIAL/PLATELET
BASOS ABS: 0.1 10*3/uL (ref 0.0–0.1)
Basophils Relative: 1 % (ref 0.0–3.0)
EOS ABS: 0.2 10*3/uL (ref 0.0–0.7)
Eosinophils Relative: 3.9 % (ref 0.0–5.0)
HEMATOCRIT: 42.4 % (ref 36.0–46.0)
HEMOGLOBIN: 13.6 g/dL (ref 12.0–15.0)
LYMPHS ABS: 2.1 10*3/uL (ref 0.7–4.0)
Lymphocytes Relative: 32.5 % (ref 12.0–46.0)
MCHC: 32 g/dL (ref 30.0–36.0)
MCV: 91.8 fl (ref 78.0–100.0)
Monocytes Absolute: 0.6 10*3/uL (ref 0.1–1.0)
Monocytes Relative: 9.9 % (ref 3.0–12.0)
NEUTROS ABS: 3.4 10*3/uL (ref 1.4–7.7)
Neutrophils Relative %: 52.7 % (ref 43.0–77.0)
PLATELETS: 233 10*3/uL (ref 150.0–400.0)
RBC: 4.62 Mil/uL (ref 3.87–5.11)
RDW: 13.7 % (ref 11.5–14.6)
WBC: 6.4 10*3/uL (ref 4.5–10.5)

## 2013-08-03 LAB — COMPREHENSIVE METABOLIC PANEL
ALK PHOS: 61 U/L (ref 39–117)
ALT: 24 U/L (ref 0–35)
AST: 23 U/L (ref 0–37)
Albumin: 4.4 g/dL (ref 3.5–5.2)
BUN: 22 mg/dL (ref 6–23)
CO2: 30 mEq/L (ref 19–32)
CREATININE: 1 mg/dL (ref 0.4–1.2)
Calcium: 9.8 mg/dL (ref 8.4–10.5)
Chloride: 103 mEq/L (ref 96–112)
GFR: 56.01 mL/min — ABNORMAL LOW (ref >60.00)
Glucose, Bld: 94 mg/dL (ref 70–99)
Potassium: 3.4 mEq/L — ABNORMAL LOW (ref 3.5–5.1)
SODIUM: 141 meq/L (ref 135–145)
TOTAL PROTEIN: 7.6 g/dL (ref 6.0–8.3)
Total Bilirubin: 0.6 mg/dL (ref 0.3–1.2)

## 2013-08-03 LAB — LIPID PANEL
Cholesterol: 182 mg/dL (ref 0–200)
HDL: 56.3 mg/dL (ref >39.00)
LDL CALC: 105 mg/dL — AB (ref 0–99)
Total CHOL/HDL Ratio: 3
Triglycerides: 102 mg/dL (ref 0.0–149.0)
VLDL: 20.4 mg/dL (ref 0.0–40.0)

## 2013-08-03 LAB — TSH: TSH: 1.26 u[IU]/mL (ref 0.35–5.50)

## 2013-08-03 NOTE — Telephone Encounter (Signed)
Patient came into office and filled out a Triage Form giving the address for her medications to be mailed in.  It is [facility] which is already listed as her Psychologist, clinical.  She doesn't state if she needs Rx's sent now.  Form given to [facility] for review.

## 2013-08-03 NOTE — Telephone Encounter (Signed)
Pts pharmacy previously listed as [facility]. Paperwork discarded, information already on file.

## 2013-08-10 ENCOUNTER — Encounter: Payer: Medicare Other | Admitting: Family Medicine

## 2013-08-18 ENCOUNTER — Telehealth: Payer: Self-pay | Admitting: Family Medicine

## 2013-08-18 ENCOUNTER — Encounter: Payer: Self-pay | Admitting: Family Medicine

## 2013-08-18 ENCOUNTER — Ambulatory Visit (INDEPENDENT_AMBULATORY_CARE_PROVIDER_SITE_OTHER): Payer: Commercial Managed Care - HMO | Admitting: Family Medicine

## 2013-08-18 VITALS — BP 114/60 | HR 64 | Temp 97.7°F | Ht 62.5 in | Wt 149.8 lb

## 2013-08-18 DIAGNOSIS — Z23 Encounter for immunization: Secondary | ICD-10-CM

## 2013-08-18 DIAGNOSIS — M1990 Unspecified osteoarthritis, unspecified site: Secondary | ICD-10-CM

## 2013-08-18 DIAGNOSIS — Z0000 Encounter for general adult medical examination without abnormal findings: Secondary | ICD-10-CM

## 2013-08-18 DIAGNOSIS — E039 Hypothyroidism, unspecified: Secondary | ICD-10-CM

## 2013-08-18 DIAGNOSIS — M899 Disorder of bone, unspecified: Secondary | ICD-10-CM

## 2013-08-18 DIAGNOSIS — M949 Disorder of cartilage, unspecified: Secondary | ICD-10-CM

## 2013-08-18 DIAGNOSIS — E785 Hyperlipidemia, unspecified: Secondary | ICD-10-CM

## 2013-08-18 DIAGNOSIS — I10 Essential (primary) hypertension: Secondary | ICD-10-CM

## 2013-08-18 DIAGNOSIS — M8580 Other specified disorders of bone density and structure, unspecified site: Secondary | ICD-10-CM

## 2013-08-18 NOTE — Assessment & Plan Note (Signed)
Well controlled on current rx. No changes. 

## 2013-08-18 NOTE — Progress Notes (Signed)
Pre visit review using our clinic review tool, if applicable. No additional management support is needed unless otherwise documented below in the visit note. 

## 2013-08-18 NOTE — Assessment & Plan Note (Signed)
Orders Placed This Encounter  Procedures  . DG Bone Density

## 2013-08-18 NOTE — Assessment & Plan Note (Addendum)
The patients weight, height, BMI and visual acuity have been recorded in the chart I have made referrals, counseling and provided education to the patient based review of the above and I have provided the pt with a written personalized care plan for preventive services.  Prevnar booster today.

## 2013-08-18 NOTE — Patient Instructions (Signed)
Continue your current medications. We will call you about your bone density scan.  Have a great trip to [place]!

## 2013-08-18 NOTE — Assessment & Plan Note (Signed)
Stable on current rx. No changes. 

## 2013-08-18 NOTE — Telephone Encounter (Signed)
Relevant patient education assigned to patient using Emmi. ° °

## 2013-08-18 NOTE — Progress Notes (Signed)
68 yo pleasant female here for annual medicare wellness visit.   I have personally reviewed the Medicare Annual Wellness questionnaire and have noted 1. The patient's medical and social history 2. Their use of alcohol, tobacco or illicit drugs 3. Their current medications and supplements 4. The patient's functional ability including ADL's, fall risks, home safety risks and hearing or visual             Impairment.- independent for all ADLs. 5. Diet and physical activities- very physically active 6. Evidence for depression or mood disorders- no signs or symptoms of depression or mood disorders.  End of life wishes discussed and updated in Social History.  She is going to stay with her daughter in law to help with her grand daughter while her son is in [place] this month.  She is very excited.   Mammogram 02/23/13 Td 11/28/10 Colonoscopy (Dr. Deatra Ina)- 12/19/10- 10 year recall Pneumovax 11/28/10 Zostavax 11/28/10 DEXA 12/13/10 Last pap smear 10/28/2009  H/o OA and frozen shoulder.  Has been seeing Dr. Lorelei Pont.  He recommended PT but she could not afford it.  Has been doing home exercises.  Hypothyroidism- has been on Levothyroxine 75 micrograms for years.  TSH stable. Lab Results  Component Value Date   TSH 1.26 08/03/2013    HLD- on Simvastatin 20 mg daily and Fish oil 2000 mg daily.   Lab Results  Component Value Date   CHOL 182 08/03/2013   HDL 56.30 08/03/2013   LDLCALC 105* 08/03/2013   TRIG 102.0 08/03/2013   CHOLHDL 3 08/03/2013   Lab Results  Component Value Date   WBC 6.4 08/03/2013   HGB 13.6 08/03/2013   HCT 42.4 08/03/2013   MCV 91.8 08/03/2013   PLT 233.0 08/03/2013     Patient Active Problem List   Diagnosis Date Noted  . Ileus 01/16/2012  . Osteopenia   . Routine general medical examination at a health care facility 11/28/2010  . HYPOTHYROIDISM 10/27/2009  . HYPERLIPIDEMIA 10/27/2009  . HYPERTENSION 10/27/2009  . OSTEOARTHRITIS 10/27/2009  . NEPHRECTOMY, HX OF  10/27/2009   Past Medical History  Diagnosis Date  . Hyperlipidemia   . Hypertension   . Hypothyroidism   . Osteoarthritis   . History of gynecologic surgery     had right ovary removed, unsure about uterus/cervix  . Osteopenia    Past Surgical History  Procedure Laterality Date  . Appendectomy    . Tubal ligation      bilat  . 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 Other surgical history      part of intestine removed  . Dilation and curettage of uterus    . Colon surgery  1998    colectomy for ? Diverticulits  . Hernia repair      right lower abd  . Oophorectomy      right   History  Substance Use Topics  . Smoking status: Former Smoker    Quit date: 05/21/2007  . Smokeless tobacco: Never Used  . Alcohol Use: Yes     Comment: rare- wine   Family History  Problem Relation Age of Onset  . Pancreatic cancer Mother   . Heart attack Father   . Liver cancer Brother    No Known Allergies Current Outpatient Prescriptions on File Prior to Visit  Medication Sig Dispense Refill  . acetaminophen (TYLENOL) 325 MG tablet Take 650 mg by mouth every 6 (six) hours as needed.        Marland Kitchen  amLODipine (NORVASC) 10 MG tablet Take 1 tablet (10 mg total) by mouth daily.  90 tablet  0  . aspirin 81 MG tablet Take 81 mg by mouth daily.        . Calcium Carbonate-Vit D-Min (CALCIUM 1200 PO) Takes 1200 mg's by mouth daily      . hydrochlorothiazide (HYDRODIURIL) 25 MG tablet Take 1 tablet (25 mg total) by mouth daily.  90 tablet  0  . levothyroxine (SYNTHROID, LEVOTHROID) 75 MCG tablet Take 1 tablet (75 mcg total) by mouth daily.  90 tablet  0  . naproxen sodium (ANAPROX) 220 MG tablet Take 440 mg by mouth as needed. pain       . simvastatin (ZOCOR) 20 MG tablet Take 0.5 tablets (10 mg total) by mouth at bedtime.  45 tablet  0  . traMADol (ULTRAM) 50 MG tablet Take one tablet by mouth two times a day  180 tablet  0   No current facility-administered medications on file prior to  visit.   The PMH, PSH, Social History, Family History, Medications, and allergies have been reviewed in [facility], and have been updated if relevant.  ROS: Patient reports no  vision/ hearing changes,anorexia, weight change, fever ,adenopathy, persistant / recurrent hoarseness, swallowing issues, chest pain, edema,persistant / recurrent cough, hemoptysis, dyspnea(rest, exertional, paroxysmal nocturnal), gastrointestinal  bleeding (melena, rectal bleeding), abdominal pain, excessive heart burn, GU symptoms(dysuria, hematuria, pyuria, voiding/incontinence  Issues) syncope, focal weakness, severe memory loss, concerning skin lesions, depression, anxiety, abnormal bruising/bleeding, major joint swelling, breast masses or abnormal vaginal bleeding.     Physical Exam BP 114/60  Pulse 64  Temp(Src) 97.7 F (36.5 C) (Oral)  Ht 5' 2.5" (1.588 m)  Wt 149 lb 12 oz (67.926 kg)  BMI 26.94 kg/m2  SpO2 97%  General:  alert, well-developed, and overweight-appearing.   Head:  normocephalic, atraumatic, and no abnormalities observed.   Eyes:  vision grossly intact, pupils equal, and pupils round.   Ears:  R ear normal and L ear normal.   Nose:  no external deformity.   Mouth:  good dentition.   Neck:  No deformities, masses, or tenderness noted. Breasts:  No mass, nodules, thickening, tenderness, bulging, retraction, inflamation, nipple discharge or skin changes noted.   Lungs:  Normal respiratory effort, chest expands symmetrically. Lungs are clear to auscultation, no crackles or wheezes. Heart:  Normal rate and regular rhythm. S1 and S2 normal without gallop, murmur, click, rub or other extra sounds. Abdomen:  Bowel sounds positive,abdomen soft and non-tender without masses, organomegaly or hernias noted. Msk:  No deformity or scoliosis noted of thoracic or lumbar spine.   Extremities:  trace left pedal edema and trace right pedal edema.  trace left pedal edema.   enlarged DIP, PIP bilaterally, no redness  or erythema. Neurologic:  alert & oriented X3.   Skin:  Intact without suspicious lesions or rashes Psych:  Cognition and judgment appear intact. Alert and cooperative with normal attention span and concentration. No apparent delusions, illusions, hallucinations  Assessment and Plan:

## 2013-10-04 ENCOUNTER — Other Ambulatory Visit: Payer: Self-pay | Admitting: *Deleted

## 2013-10-04 MED ORDER — TRAMADOL HCL 50 MG PO TABS: ORAL_TABLET | ORAL | Status: DC

## 2013-10-04 MED ORDER — LEVOTHYROXINE SODIUM 75 MCG PO TABS: 75.0000 ug | ORAL_TABLET | Freq: Every day | ORAL | Status: DC

## 2013-10-04 MED ORDER — AMLODIPINE BESYLATE 10 MG PO TABS: 10.0000 mg | ORAL_TABLET | Freq: Every day | ORAL | Status: DC

## 2013-10-04 MED ORDER — HYDROCHLOROTHIAZIDE 25 MG PO TABS: 25.0000 mg | ORAL_TABLET | Freq: Every day | ORAL | Status: DC

## 2013-10-04 NOTE — Telephone Encounter (Signed)
Pt requesting medication refill. Last ov 08/2013 with no future appts scheduled. pls advise

## 2013-10-04 NOTE — Telephone Encounter (Signed)
Spoke to pt and informed her Rx has been sent to requested pharmacy 

## 2013-10-20 ENCOUNTER — Ambulatory Visit: Payer: Self-pay | Admitting: Family Medicine

## 2013-10-20 ENCOUNTER — Encounter: Payer: Self-pay | Admitting: Family Medicine

## 2013-12-20 ENCOUNTER — Other Ambulatory Visit: Payer: Self-pay | Admitting: *Deleted

## 2013-12-20 MED ORDER — HYDROCHLOROTHIAZIDE 25 MG PO TABS
25.0000 mg | ORAL_TABLET | Freq: Every day | ORAL | Status: DC
Start: 2013-12-20 — End: 2014-04-20

## 2013-12-20 MED ORDER — AMLODIPINE BESYLATE 10 MG PO TABS: 10.0000 mg | ORAL_TABLET | Freq: Every day | ORAL | Status: DC

## 2013-12-20 MED ORDER — LEVOTHYROXINE SODIUM 75 MCG PO TABS: 75.0000 ug | ORAL_TABLET | Freq: Every day | ORAL | Status: DC

## 2013-12-21 ENCOUNTER — Other Ambulatory Visit: Payer: Self-pay | Admitting: *Deleted

## 2013-12-21 MED ORDER — SIMVASTATIN 20 MG PO TABS: 10.0000 mg | ORAL_TABLET | Freq: Every day | ORAL | Status: DC

## 2013-12-22 ENCOUNTER — Telehealth: Payer: Self-pay | Admitting: *Deleted

## 2013-12-22 MED ORDER — TRAMADOL HCL 50 MG PO TABS: ORAL_TABLET | ORAL | Status: DC

## 2013-12-22 NOTE — Telephone Encounter (Signed)
Rx faxed to requested pharmacy 

## 2013-12-22 NOTE — Telephone Encounter (Signed)
Pt requesting medication refill. Last f/u appt 08/2013 with no future appts scheduled. pls advise

## 2013-12-23 NOTE — Telephone Encounter (Signed)
Pt request status of recent refills; advised sent to [facility]; pt will ck with pharmacy.

## 2013-12-24 NOTE — Telephone Encounter (Signed)
Pt left v/m; pt contacted [employer] and did not receive Tramadol refill. Pt request resent to [place].

## 2013-12-24 NOTE — Telephone Encounter (Signed)
Rx has been faxed, again, for the third time. If Rx is not received by pharmacy, pt may want to consider having it filled at another location

## 2014-02-16 ENCOUNTER — Ambulatory Visit (INDEPENDENT_AMBULATORY_CARE_PROVIDER_SITE_OTHER): Payer: Commercial Managed Care - HMO

## 2014-02-16 DIAGNOSIS — Z23 Encounter for immunization: Secondary | ICD-10-CM

## 2014-03-03 ENCOUNTER — Ambulatory Visit: Payer: Self-pay | Admitting: Family Medicine

## 2014-03-03 IMAGING — MG MM DIGITAL SCREENING BILAT W/ TOMO W/ CAD
8 series · 8 of 8 positions shown · non-contrast
Comparison: Previous exam(s).

CLINICAL DATA: Screening.

EXAM:
DIGITAL SCREENING BILATERAL MAMMOGRAM WITH 3D TOMO WITH CAD

[L CC]
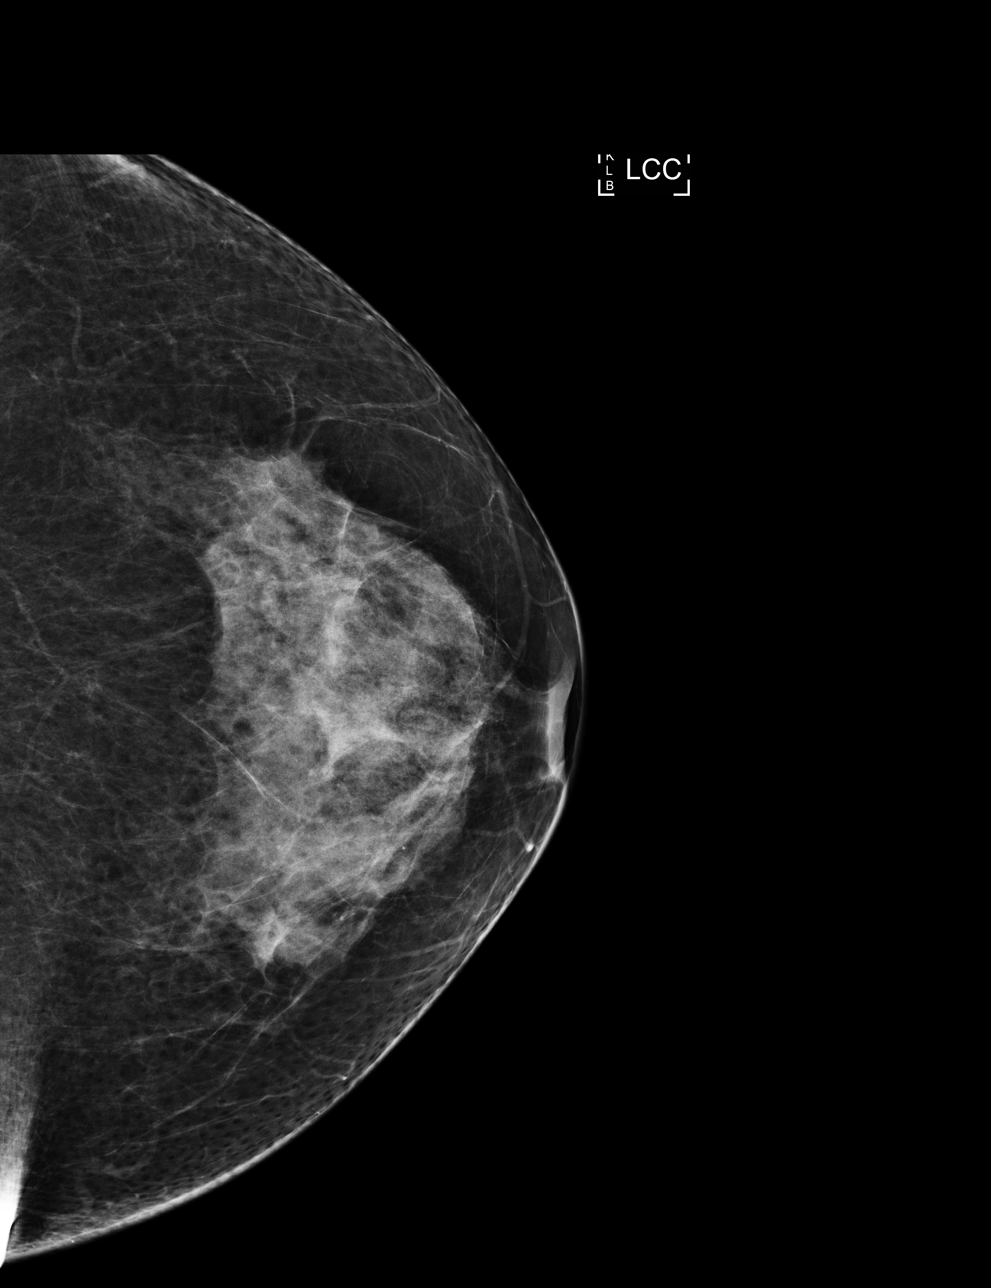

[R MLO]
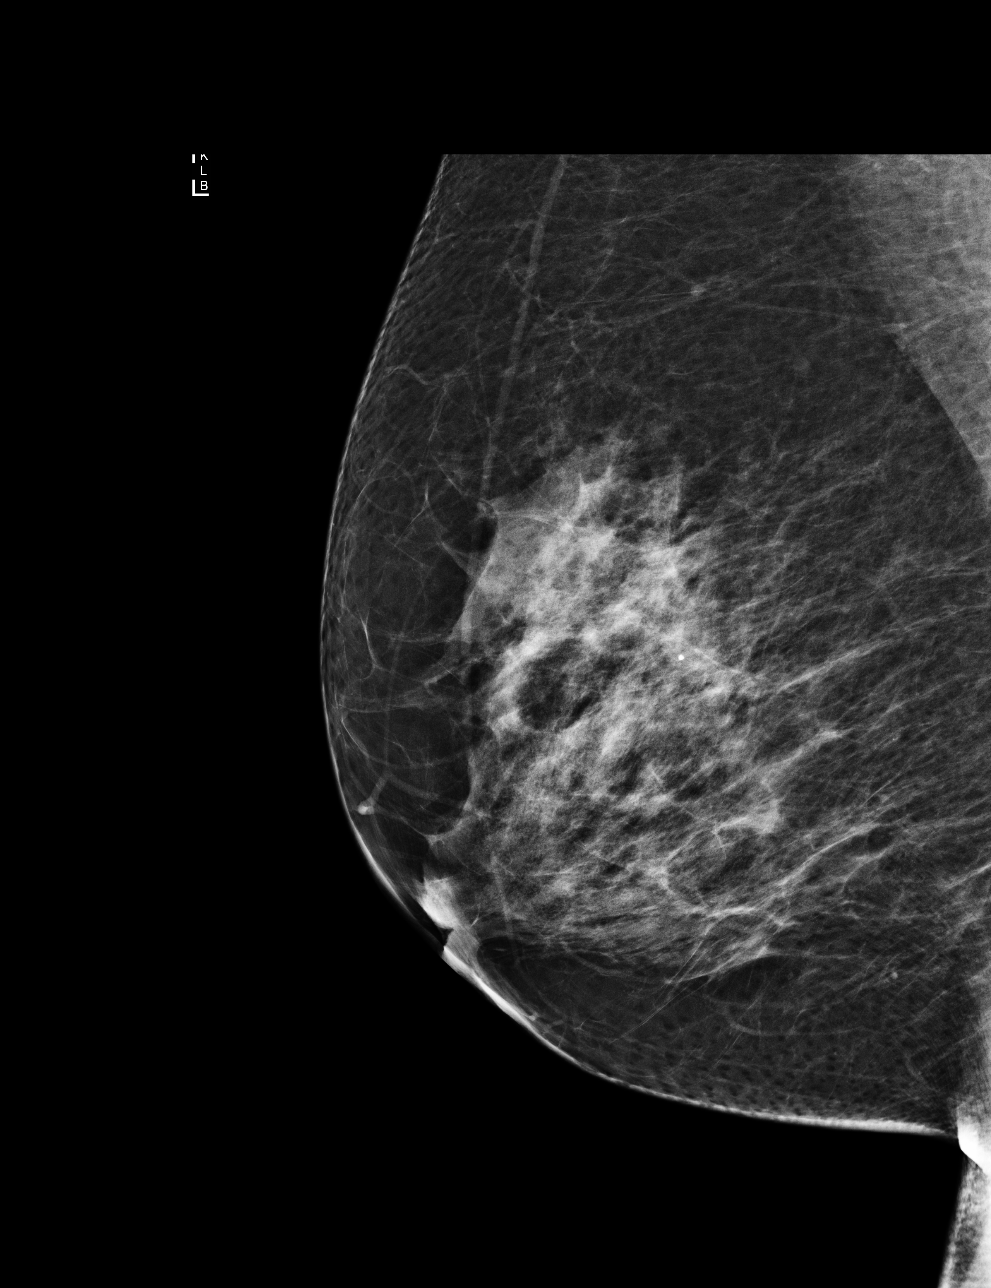

[L MLO]
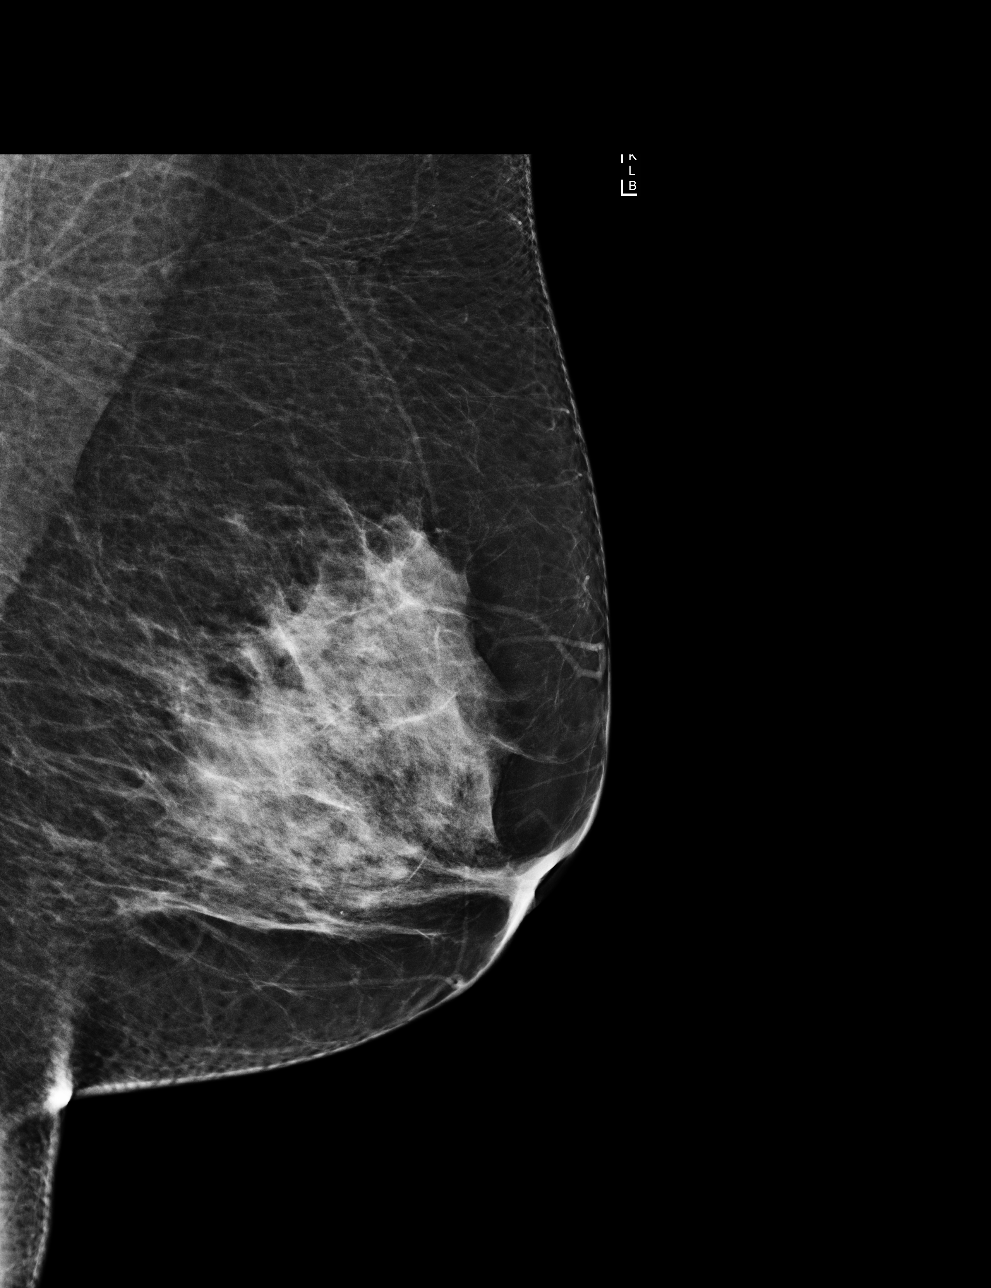

[R CC]
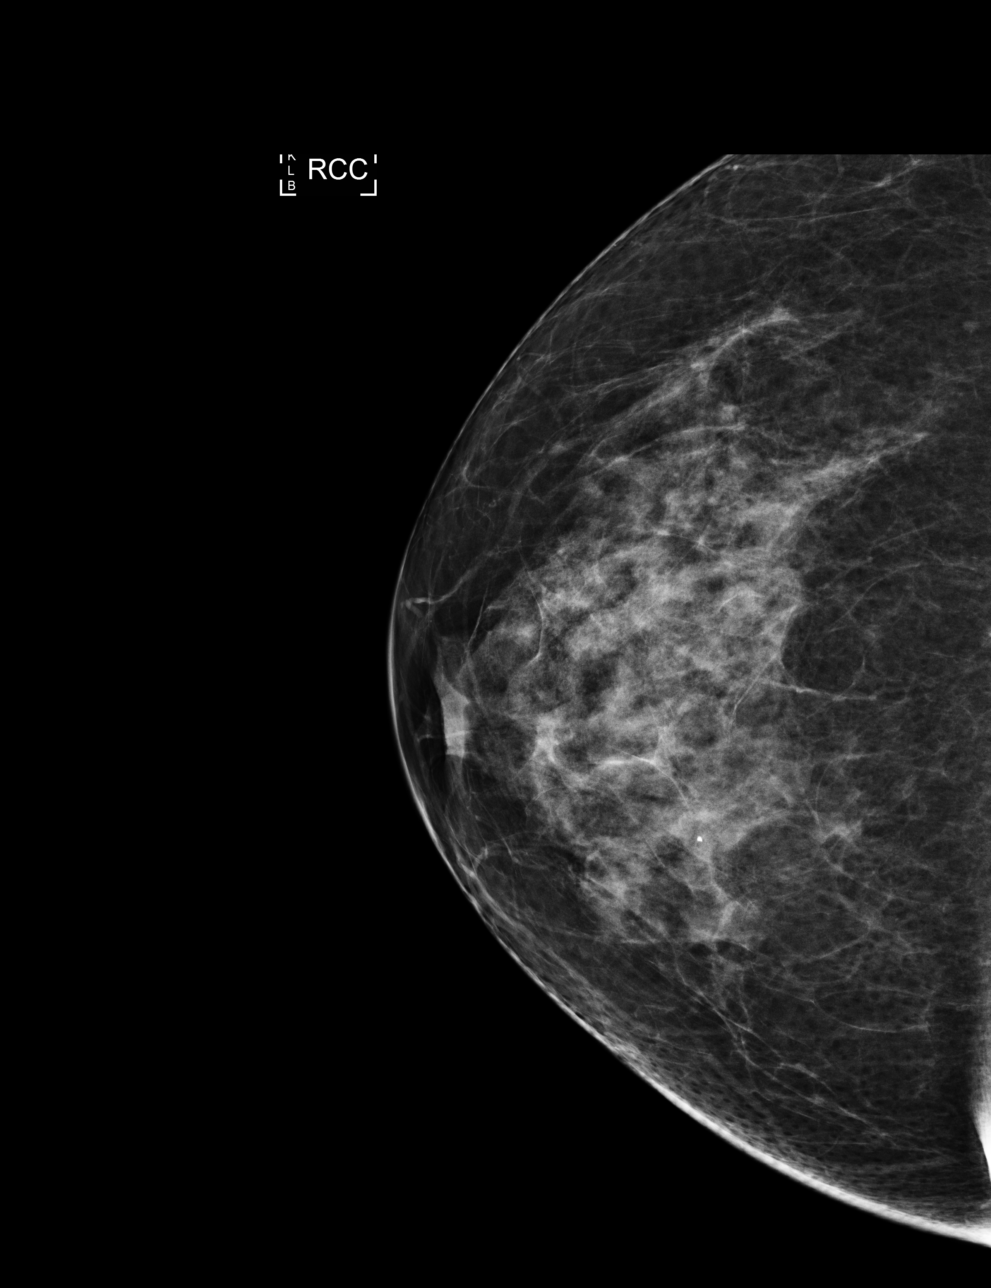

[R MLO tomo]
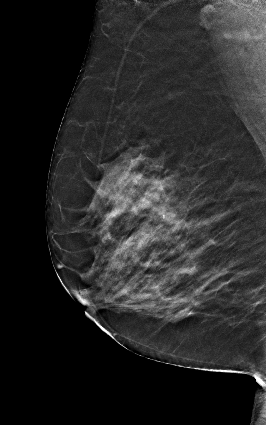

[R CC tomo]
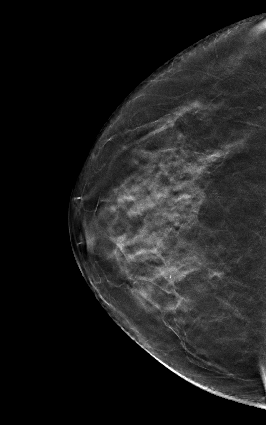

[L CC tomo]
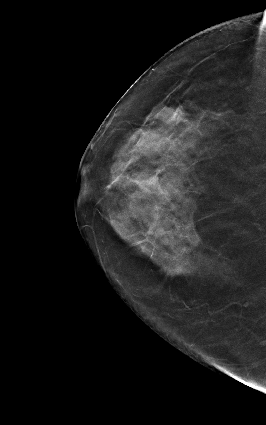

[L MLO tomo]
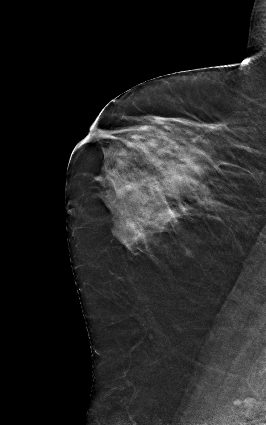

[8 of 8 positions shown; findings below may reference images not displayed]

ACR Breast Density Category c: The breast tissue is heterogeneously
dense, which may obscure small masses.
FINDINGS: There are no findings suspicious for malignancy. Images were
processed with CAD.
IMPRESSION: No mammographic evidence of malignancy. A result letter of this
screening mammogram will be mailed directly to the patient.

RECOMMENDATION:
Screening mammogram in one year. (Code:[SM])

BI-RADS CATEGORY  1: Negative.

## 2014-03-04 ENCOUNTER — Encounter: Payer: Self-pay | Admitting: Family Medicine

## 2014-03-21 ENCOUNTER — Encounter: Payer: Self-pay | Admitting: Family Medicine

## 2014-03-21 ENCOUNTER — Ambulatory Visit (INDEPENDENT_AMBULATORY_CARE_PROVIDER_SITE_OTHER): Payer: Commercial Managed Care - HMO | Admitting: Family Medicine

## 2014-03-21 ENCOUNTER — Ambulatory Visit (INDEPENDENT_AMBULATORY_CARE_PROVIDER_SITE_OTHER)
Admission: RE | Admit: 2014-03-21 | Discharge: 2014-03-21 | Disposition: A | Discharge status: Home / Self Care | Payer: Commercial Managed Care - HMO | Source: Ambulatory Visit | Attending: Family Medicine | Admitting: Family Medicine

## 2014-03-21 VITALS — BP 116/68 | HR 86 | Temp 97.9°F | Wt 144.5 lb

## 2014-03-21 DIAGNOSIS — M79605 Pain in left leg: Secondary | ICD-10-CM

## 2014-03-21 DIAGNOSIS — E785 Hyperlipidemia, unspecified: Secondary | ICD-10-CM

## 2014-03-21 DIAGNOSIS — M19019 Primary osteoarthritis, unspecified shoulder: Secondary | ICD-10-CM

## 2014-03-21 DIAGNOSIS — I10 Essential (primary) hypertension: Secondary | ICD-10-CM

## 2014-03-21 DIAGNOSIS — Z905 Acquired absence of kidney: Secondary | ICD-10-CM

## 2014-03-21 DIAGNOSIS — E038 Other specified hypothyroidism: Secondary | ICD-10-CM

## 2014-03-21 LAB — LIPID PANEL
CHOL/HDL RATIO: 3
Cholesterol: 194 mg/dL (ref 0–200)
HDL: 69.3 mg/dL (ref >39.00)
LDL CALC: 106 mg/dL — AB (ref 0–99)
NONHDL: 124.7
Triglycerides: 93 mg/dL (ref 0.0–149.0)
VLDL: 18.6 mg/dL (ref 0.0–40.0)

## 2014-03-21 LAB — CBC WITH DIFFERENTIAL/PLATELET
Basophils Absolute: 0.1 10*3/uL (ref 0.0–0.1)
Basophils Relative: 0.6 % (ref 0.0–3.0)
EOS PCT: 2.8 % (ref 0.0–5.0)
Eosinophils Absolute: 0.2 10*3/uL (ref 0.0–0.7)
HCT: 43.6 % (ref 36.0–46.0)
Hemoglobin: 14.2 g/dL (ref 12.0–15.0)
Lymphocytes Relative: 25.8 % (ref 12.0–46.0)
Lymphs Abs: 2.3 10*3/uL (ref 0.7–4.0)
MCHC: 32.6 g/dL (ref 30.0–36.0)
MCV: 90.2 fl (ref 78.0–100.0)
Monocytes Absolute: 0.6 10*3/uL (ref 0.1–1.0)
Monocytes Relative: 6.3 % (ref 3.0–12.0)
NEUTROS PCT: 64.5 % (ref 43.0–77.0)
Neutro Abs: 5.7 10*3/uL (ref 1.4–7.7)
PLATELETS: 291 10*3/uL (ref 150.0–400.0)
RBC: 4.83 Mil/uL (ref 3.87–5.11)
RDW: 14.4 % (ref 11.5–15.5)
WBC: 8.8 10*3/uL (ref 4.0–10.5)

## 2014-03-21 LAB — COMPREHENSIVE METABOLIC PANEL
ALBUMIN: 4.1 g/dL (ref 3.5–5.2)
ALK PHOS: 79 U/L (ref 39–117)
ALT: 27 U/L (ref 0–35)
AST: 25 U/L (ref 0–37)
BUN: 19 mg/dL (ref 6–23)
CO2: 23 mEq/L (ref 19–32)
Calcium: 9.7 mg/dL (ref 8.4–10.5)
Chloride: 105 mEq/L (ref 96–112)
Creatinine, Ser: 1.1 mg/dL (ref 0.4–1.2)
GFR: 52.4 mL/min — ABNORMAL LOW (ref >60.00)
GLUCOSE: 90 mg/dL (ref 70–99)
POTASSIUM: 3.1 meq/L — AB (ref 3.5–5.1)
SODIUM: 138 meq/L (ref 135–145)
TOTAL PROTEIN: 8.2 g/dL (ref 6.0–8.3)
Total Bilirubin: 0.8 mg/dL (ref 0.2–1.2)

## 2014-03-21 LAB — TSH: TSH: 2.94 u[IU]/mL (ref 0.35–4.50)

## 2014-03-21 IMAGING — CR DG TIBIA/FIBULA 2V*L*
2 series · 2 of 2 positions shown · non-contrast
Comparison: None.

CLINICAL DATA: Four month history of deep bone pain left lower leg.
No injury.

EXAM:
LEFT TIBIA AND FIBULA - 2 VIEW

[view not recorded (1 of 2)]
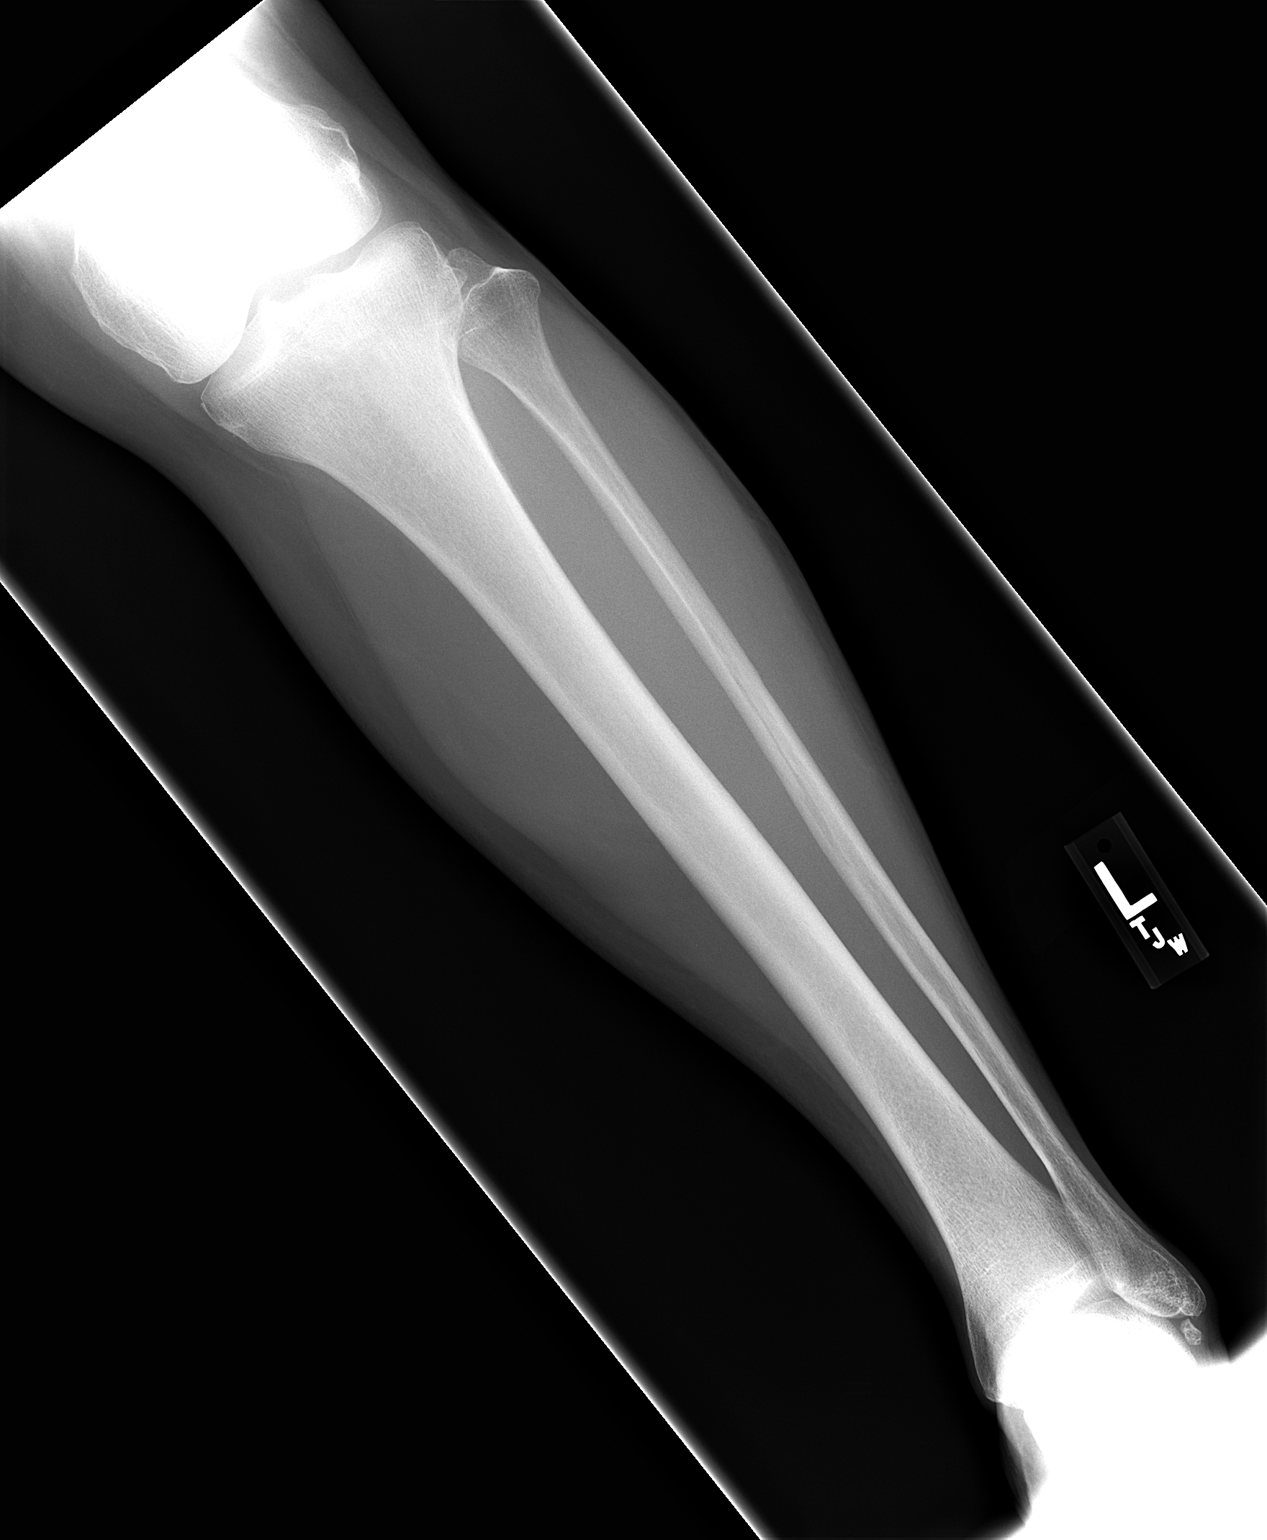

[view not recorded (2 of 2)]
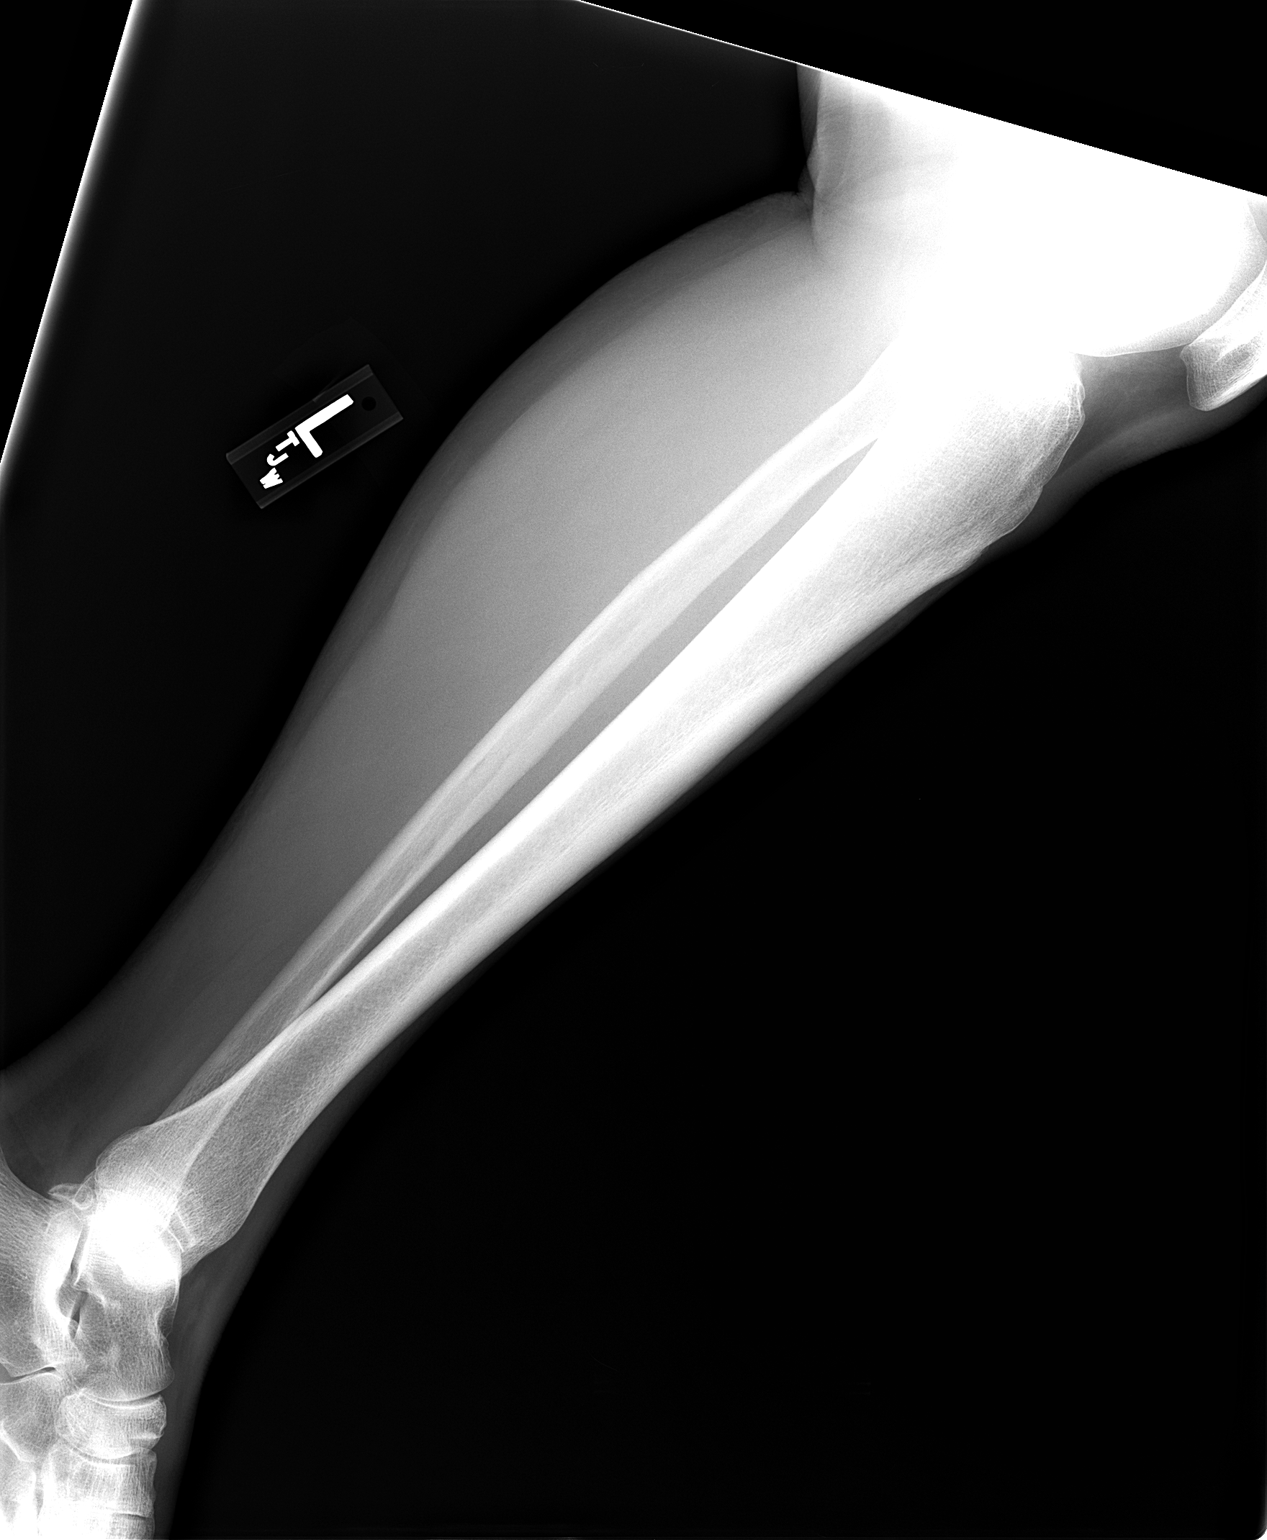

[2 of 2 positions shown; findings below may reference images not displayed]

FINDINGS: There is no evidence of fracture or other focal bone lesions. Soft
tissues are unremarkable.
IMPRESSION: Negative.

## 2014-03-21 NOTE — Assessment & Plan Note (Signed)
Well controlled on current rx. No changes made today. 

## 2014-03-21 NOTE — Assessment & Plan Note (Signed)
Continue current rx. Check labs today. 

## 2014-03-21 NOTE — Assessment & Plan Note (Signed)
New- unclear etiology. Not classic location for OA. Will get xray today for further evaluation. The patient indicates understanding of these issues and agrees with the plan.

## 2014-03-21 NOTE — Assessment & Plan Note (Signed)
Continue current dose of statin 

## 2014-03-21 NOTE — Progress Notes (Signed)
68 yo here for follow up.  Also having some left lower leg pain for at least 4 months.  "I feel like it is deep in my bones." Has a h/o OA, but never in her leg like this. No known injury.  Hypothyroidism- has been on Levothyroxine 75 micrograms for years.  TSH stable. Lab Results  Component Value Date   TSH 1.26 08/03/2013    HLD- on Simvastatin 20 mg daily and Fish oil 2000 mg daily.   Lab Results  Component Value Date   CHOL 182 08/03/2013   HDL 56.30 08/03/2013   LDLCALC 105* 08/03/2013   TRIG 102.0 08/03/2013   CHOLHDL 3 08/03/2013   Lab Results  Component Value Date   WBC 6.4 08/03/2013   HGB 13.6 08/03/2013   HCT 42.4 08/03/2013   MCV 91.8 08/03/2013   PLT 233.0 08/03/2013   HTN- BP has been stable on norvasc 10 mg and HCTZ 25 mg daily. Denies HA, blurred vision, CP or SOB. Lab Results  Component Value Date   CREATININE 1.0 08/03/2013     Patient Active Problem List   Diagnosis Date Noted  . Ileus 01/16/2012  . Osteopenia   . HYPOTHYROIDISM 10/27/2009  . HLD (hyperlipidemia) 10/27/2009  . HYPERTENSION 10/27/2009  . Osteoarthritis 10/27/2009  . NEPHRECTOMY, HX OF 10/27/2009   Past Medical History  Diagnosis Date  . Hyperlipidemia   . Hypertension   . Hypothyroidism   . Osteoarthritis   . History of gynecologic surgery     had right ovary removed, unsure about uterus/cervix  . Osteopenia    Past Surgical History  Procedure Laterality Date  . Appendectomy    . Tubal ligation      bilat  . 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 Other surgical history      part of intestine removed  . Dilation and curettage of uterus    . Colon surgery  1998    colectomy for ? Diverticulits  . Hernia repair      right lower abd  . Oophorectomy      right   History  Substance Use Topics  . Smoking status: Former Smoker    Quit date: 05/21/2007  . Smokeless tobacco: Never Used  . Alcohol Use: Yes     Comment: rare- wine   Family History   Problem Relation Age of Onset  . Pancreatic cancer Mother   . Heart attack Father   . Liver cancer Brother    No Known Allergies Current Outpatient Prescriptions on File Prior to Visit  Medication Sig Dispense Refill  . acetaminophen (TYLENOL) 325 MG tablet Take 650 mg by mouth every 6 (six) hours as needed.      Marland Kitchen amLODipine (NORVASC) 10 MG tablet Take 1 tablet (10 mg total) by mouth daily. 90 tablet 0  . aspirin 81 MG tablet Take 81 mg by mouth daily.      . Calcium Carbonate-Vit D-Min (CALCIUM 1200 PO) Takes 1200 mg's by mouth daily    . hydrochlorothiazide (HYDRODIURIL) 25 MG tablet Take 1 tablet (25 mg total) by mouth daily. 90 tablet 0  . levothyroxine (SYNTHROID, LEVOTHROID) 75 MCG tablet Take 1 tablet (75 mcg total) by mouth daily. 90 tablet 0  . naproxen sodium (ANAPROX) 220 MG tablet Take 440 mg by mouth as needed. pain     . simvastatin (ZOCOR) 20 MG tablet Take 0.5 tablets (10 mg total) by mouth at bedtime. 45 tablet 1  .  traMADol (ULTRAM) 50 MG tablet Take one tablet by mouth two times a day 180 tablet 0   No current facility-administered medications on file prior to visit.   The PMH, PSH, Social History, Family History, Medications, and allergies have been reviewed in [facility], and have been updated if relevant.  ROS: See HPI No LE edema No myaglias   Physical Exam BP 116/68 mmHg  Pulse 86  Temp(Src) 97.9 F (36.6 C) (Oral)  Wt 144 lb 8 oz (65.545 kg)  SpO2 97%  General:  alert, well-developed, and overweight-appearing.   Head:  normocephalic, atraumatic, and no abnormalities observed.   Eyes:  vision grossly intact, pupils equal, and pupils round.   Ears:  R ear normal and L ear normal.   Nose:  no external deformity.   Mouth:  good dentition.   Neck:  No deformities, masses, or tenderness noted. Breasts:  No mass, nodules, thickening, tenderness, bulging, retraction, inflamation, nipple discharge or skin changes noted.   Lungs:  Normal respiratory effort,  chest expands symmetrically. Lungs are clear to auscultation, no crackles or wheezes. Heart:  Normal rate and regular rhythm. S1 and S2 normal without gallop, murmur, click, rub or other extra sounds. Abdomen:  Bowel sounds positive,abdomen soft and non-tender without masses, organomegaly or hernias noted. Msk:  No deformity or scoliosis noted of thoracic or lumbar spine.   Extremities:   No LE edema, NO TTP over Tib/Fib, no pain with weight bearing  enlarged DIP, PIP bilaterally, no redness or erythema. Neurologic:  alert & oriented X3.   Skin:  Intact without suspicious lesions or rashes Psych:  Cognition and judgment appear intact. Alert and cooperative with normal attention span and concentration. No apparent delusions, illusions, hallucinations  Assessment and Plan:

## 2014-03-21 NOTE — Progress Notes (Signed)
Pre visit review using our clinic review tool, if applicable. No additional management support is needed unless otherwise documented below in the visit note. 

## 2014-03-21 NOTE — Patient Instructions (Signed)
Great to see you. Hang in there. I will call you with your xray and lab results.

## 2014-03-24 ENCOUNTER — Encounter: Payer: Self-pay | Admitting: *Deleted

## 2014-04-20 ENCOUNTER — Telehealth: Payer: Self-pay | Admitting: *Deleted

## 2014-04-20 MED ORDER — HYDROCHLOROTHIAZIDE 25 MG PO TABS: 25.0000 mg | ORAL_TABLET | Freq: Every day | ORAL | Status: DC

## 2014-04-20 MED ORDER — AMLODIPINE BESYLATE 10 MG PO TABS: 10.0000 mg | ORAL_TABLET | Freq: Every day | ORAL | Status: DC

## 2014-04-20 MED ORDER — TRAMADOL HCL 50 MG PO TABS: ORAL_TABLET | ORAL | Status: DC

## 2014-04-20 MED ORDER — LEVOTHYROXINE SODIUM 75 MCG PO TABS: 75.0000 ug | ORAL_TABLET | Freq: Every day | ORAL | Status: DC

## 2014-04-20 NOTE — Telephone Encounter (Signed)
Pt requesting medication refill. Last f/u appt 03/2014. pls advise 

## 2014-04-21 ENCOUNTER — Telehealth: Payer: Self-pay | Admitting: Family Medicine

## 2014-04-21 NOTE — Telephone Encounter (Signed)
Lm on pts vm requesting a call back. Simvastatin refill not needed. filled 12/2013 #45 1R and pt only takes 1/2 tab daily.

## 2014-04-21 NOTE — Telephone Encounter (Signed)
Pt called in to state that the other prescriptions were called in but she is missing the simvistatin

## 2014-05-02 MED ORDER — TRAMADOL HCL 50 MG PO TABS
ORAL_TABLET | ORAL | Status: DC
Start: 2014-05-02 — End: 2014-07-22

## 2014-05-02 NOTE — Telephone Encounter (Signed)
Rx printed to be refaxed. If not received, pt may need to choose local pharmacy or pickup from the front desk and mail

## 2014-05-02 NOTE — Addendum Note (Signed)
Addended by: Modena Nunnery on: 05/02/2014 04:25 PM   Modules accepted: Orders

## 2014-05-02 NOTE — Telephone Encounter (Signed)
Pt has spoken with [facility] mail order x 2 and [employer] has not received 04/20/14 Tramadol rx. Was the rx faxed to [facility]? Pt request cb. Pt said her last bottle of tramadol is dated 12/27/13.

## 2014-07-22 ENCOUNTER — Other Ambulatory Visit: Payer: Self-pay

## 2014-07-22 MED ORDER — TRAMADOL HCL 50 MG PO TABS: ORAL_TABLET | ORAL | Status: DC

## 2014-07-22 MED ORDER — LEVOTHYROXINE SODIUM 75 MCG PO TABS: 75.0000 ug | ORAL_TABLET | Freq: Every day | ORAL | Status: DC

## 2014-07-22 MED ORDER — HYDROCHLOROTHIAZIDE 25 MG PO TABS: 25.0000 mg | ORAL_TABLET | Freq: Every day | ORAL | Status: DC

## 2014-07-22 MED ORDER — SIMVASTATIN 20 MG PO TABS: 10.0000 mg | ORAL_TABLET | Freq: Every day | ORAL | Status: DC

## 2014-07-22 MED ORDER — AMLODIPINE BESYLATE 10 MG PO TABS: 10.0000 mg | ORAL_TABLET | Freq: Every day | ORAL | Status: DC

## 2014-07-22 NOTE — Telephone Encounter (Signed)
Pt has changed mail order pharmacy to [facility] and request refills of amlodipine, HCTZ, levothyroxine and simvastatin (advised done). Pt also request [facility]adol to [facility]. Advised pt she does need to schedule CPX and pt will cb to schedule CPX. Pt last CPX 08/18/2013 and last f/u appt 03/21/2014.

## 2014-07-26 MED ORDER — TRAMADOL HCL 50 MG PO TABS: ORAL_TABLET | ORAL | Status: DC

## 2014-07-26 NOTE — Telephone Encounter (Signed)
Rx reprinted and faxed to requested pharmacy

## 2014-07-26 NOTE — Addendum Note (Signed)
Addended by: Modena Nunnery on: 07/26/2014 08:01 AM   Modules accepted: Orders

## 2014-10-26 ENCOUNTER — Encounter: Payer: Self-pay | Admitting: Gastroenterology

## 2014-10-26 ENCOUNTER — Ambulatory Visit (INDEPENDENT_AMBULATORY_CARE_PROVIDER_SITE_OTHER): Payer: PPO | Admitting: Family Medicine

## 2014-10-26 ENCOUNTER — Encounter: Payer: Self-pay | Admitting: Family Medicine

## 2014-10-26 VITALS — BP 118/72 | HR 65 | Temp 98.3°F | Wt 145.2 lb

## 2014-10-26 DIAGNOSIS — I10 Essential (primary) hypertension: Secondary | ICD-10-CM | POA: Diagnosis not present

## 2014-10-26 DIAGNOSIS — E038 Other specified hypothyroidism: Secondary | ICD-10-CM | POA: Diagnosis not present

## 2014-10-26 DIAGNOSIS — R1310 Dysphagia, unspecified: Secondary | ICD-10-CM

## 2014-10-26 DIAGNOSIS — E785 Hyperlipidemia, unspecified: Secondary | ICD-10-CM

## 2014-10-26 LAB — COMPREHENSIVE METABOLIC PANEL
ALK PHOS: 85 U/L (ref 39–117)
ALT: 20 U/L (ref 0–35)
AST: 23 U/L (ref 0–37)
Albumin: 4.6 g/dL (ref 3.5–5.2)
BUN: 22 mg/dL (ref 6–23)
CALCIUM: 10.3 mg/dL (ref 8.4–10.5)
CO2: 31 meq/L (ref 19–32)
Chloride: 101 mEq/L (ref 96–112)
Creatinine, Ser: 1.04 mg/dL (ref 0.40–1.20)
GFR: 55.81 mL/min — ABNORMAL LOW (ref >60.00)
Glucose, Bld: 98 mg/dL (ref 70–99)
POTASSIUM: 3.4 meq/L — AB (ref 3.5–5.1)
Sodium: 139 mEq/L (ref 135–145)
TOTAL PROTEIN: 7.8 g/dL (ref 6.0–8.3)
Total Bilirubin: 0.9 mg/dL (ref 0.2–1.2)

## 2014-10-26 LAB — LIPID PANEL
Cholesterol: 182 mg/dL (ref 0–200)
HDL: 60.9 mg/dL (ref >39.00)
LDL CALC: 99 mg/dL (ref 0–99)
NonHDL: 121.1
Total CHOL/HDL Ratio: 3
Triglycerides: 112 mg/dL (ref 0.0–149.0)
VLDL: 22.4 mg/dL (ref 0.0–40.0)

## 2014-10-26 LAB — T4, FREE: Free T4: 1.01 ng/dL (ref 0.60–1.60)

## 2014-10-26 LAB — VITAMIN B12: Vitamin B-12: 330 pg/mL (ref 211–911)

## 2014-10-26 LAB — TSH: TSH: 1.5 u[IU]/mL (ref 0.35–4.50)

## 2014-10-26 NOTE — Assessment & Plan Note (Signed)
New- progressive. Refer to Dr. Deatra Ina (did her colonoscopy in 2012)- for endoscopy to rule out esophageal stricture and less likely malignancy. The patient indicates understanding of these issues and agrees with the plan.

## 2014-10-26 NOTE — Progress Notes (Signed)
Pre visit review using our clinic review tool, if applicable. No additional management support is needed unless otherwise documented below in the visit note. 

## 2014-10-26 NOTE — Assessment & Plan Note (Signed)
Continue current dose of zocor. Recheck labs today.

## 2014-10-26 NOTE — Progress Notes (Signed)
69 yo pleasant female here for follow up with complaint of dysphagia.  Dysphagia- past several months to a year of worsening dysphagia.  At first, she thought she was just having difficulty swallowing bread because it is dry.  Now having difficulty swallowing solids and liquids.  Former smoker.  She is not a heavy drinker.  Appetite and weight are both stable.  Wt Readings from Last 3 Encounters:  10/26/14 145 lb 4 oz (65.885 kg)  03/21/14 144 lb 8 oz (65.545 kg)  08/18/13 149 lb 12 oz (67.926 kg)      Hypothyroidism- has been on Levothyroxine 75 micrograms for years.  TSH stable. Lab Results  Component Value Date   TSH 2.94 03/21/2014    HLD- on Simvastatin 20 mg daily and Fish oil 2000 mg daily.   Lab Results  Component Value Date   CHOL 194 03/21/2014   HDL 69.30 03/21/2014   LDLCALC 106* 03/21/2014   TRIG 93.0 03/21/2014   CHOLHDL 3 03/21/2014   Lab Results  Component Value Date   WBC 8.8 03/21/2014   HGB 14.2 03/21/2014   HCT 43.6 03/21/2014   MCV 90.2 03/21/2014   PLT 291.0 03/21/2014   HTN- BP has been stable on norvasc 10 mg and HCTZ 25 mg daily. Denies HA, blurred vision, CP or SOB. Lab Results  Component Value Date   CREATININE 1.1 03/21/2014     Patient Active Problem List   Diagnosis Date Noted  . Osteopenia   . Hypothyroidism 10/27/2009  . HLD (hyperlipidemia) 10/27/2009  . Essential hypertension 10/27/2009  . Osteoarthritis 10/27/2009  . NEPHRECTOMY, HX OF 10/27/2009   Past Medical History  Diagnosis Date  . Hyperlipidemia   . Hypertension   . Hypothyroidism   . Osteoarthritis   . History of gynecologic surgery     had right ovary removed, unsure about uterus/cervix  . Osteopenia    Past Surgical History  Procedure Laterality Date  . Appendectomy    . Tubal ligation      bilat  . 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 Other surgical history      part of intestine removed  . Dilation and curettage of uterus    . Colon  surgery  1998    colectomy for ? Diverticulits  . Hernia repair      right lower abd  . Oophorectomy      right   History  Substance Use Topics  . Smoking status: Former Smoker    Quit date: 05/21/2007  . Smokeless tobacco: Never Used  . Alcohol Use: Yes     Comment: rare- wine   Family History  Problem Relation Age of Onset  . Pancreatic cancer Mother   . Heart attack Father   . Liver cancer Brother    No Known Allergies Current Outpatient Prescriptions on File Prior to Visit  Medication Sig Dispense Refill  . acetaminophen (TYLENOL) 325 MG tablet Take 650 mg by mouth every 6 (six) hours as needed.      Marland Kitchen amLODipine (NORVASC) 10 MG tablet Take 1 tablet (10 mg total) by mouth daily. 90 tablet 0  . aspirin 81 MG tablet Take 81 mg by mouth daily.      . Calcium Carbonate-Vit D-Min (CALCIUM 1200 PO) Takes 1200 mg's by mouth daily    . hydrochlorothiazide (HYDRODIURIL) 25 MG tablet Take 1 tablet (25 mg total) by mouth daily. 90 tablet 0  . levothyroxine (SYNTHROID, LEVOTHROID) 75 MCG  tablet Take 1 tablet (75 mcg total) by mouth daily. 90 tablet 0  . naproxen sodium (ANAPROX) 220 MG tablet Take 440 mg by mouth as needed. pain     . simvastatin (ZOCOR) 20 MG tablet Take 0.5 tablets (10 mg total) by mouth at bedtime. 45 tablet 0  . traMADol (ULTRAM) 50 MG tablet Take one tablet by mouth two times a day 180 tablet 0  . Ascorbic Acid (VITAMIN C) 1000 MG tablet Take 1,000 mg by mouth daily.     No current facility-administered medications on file prior to visit.   The PMH, PSH, Social History, Family History, Medications, and allergies have been reviewed in [facility], and have been updated if relevant.  ROS: Review of Systems  Constitutional: Negative.   HENT: Positive for sore throat and trouble swallowing. Negative for dental problem, rhinorrhea, sinus pressure, sneezing and voice change.   Eyes: Negative.   Respiratory: Negative.   Cardiovascular: Negative.   Gastrointestinal:  Negative.   Endocrine: Negative.   Genitourinary: Negative.   Musculoskeletal: Negative.   Skin: Negative.   Allergic/Immunologic: Negative.   Neurological: Negative.   Hematological: Negative.   Psychiatric/Behavioral: Negative.   All other systems reviewed and are negative.     Physical Exam BP 118/72 mmHg  Pulse 65  Temp(Src) 98.3 F (36.8 C) (Oral)  Wt 145 lb 4 oz (65.885 kg)  SpO2 96% Wt Readings from Last 3 Encounters:  10/26/14 145 lb 4 oz (65.885 kg)  03/21/14 144 lb 8 oz (65.545 kg)  08/18/13 149 lb 12 oz (67.926 kg)    General:  alert, well-developed, and overweight-appearing.   Head:  normocephalic, atraumatic, and no abnormalities observed.   Eyes:  vision grossly intact, pupils equal, and pupils round.   Ears:  R ear normal and L ear normal.   Nose:  no external deformity.   Mouth:  good dentition.   Neck:  No deformities, masses, or tenderness noted. Breasts:  No mass, nodules, thickening, tenderness, bulging, retraction, inflamation, nipple discharge or skin changes noted.   Lungs:  Normal respiratory effort, chest expands symmetrically. Lungs are clear to auscultation, no crackles or wheezes. Heart:  Normal rate and regular rhythm. S1 and S2 normal without gallop, murmur, click, rub or other extra sounds. Abdomen:  Bowel sounds positive,abdomen soft and non-tender without masses, organomegaly or hernias noted. Msk:  No deformity or scoliosis noted of thoracic or lumbar spine.  enlarged DIP, PIP bilaterally, no redness or erythema. Neurologic:  alert & oriented X3.   Skin:  Intact without suspicious lesions or rashes Psych:  Cognition and judgment appear intact. Alert and cooperative with normal attention span and concentration. No apparent delusions, illusions, hallucinations

## 2014-10-26 NOTE — Assessment & Plan Note (Signed)
Has been stable.  Given dysphagia, will recheck labs today. Orders Placed This Encounter  Procedures  . TSH  . T4, Free  . Lipid panel  . Comprehensive metabolic panel  . Vitamin B12  . Ambulatory referral to Gastroenterology

## 2014-10-26 NOTE — Patient Instructions (Signed)
Great to see you. Please stop by to see Rosaria Ferries on your way out after you go to the lab.

## 2014-10-26 NOTE — Assessment & Plan Note (Signed)
Well controlled on current rx. No changes made today. 

## 2014-10-28 ENCOUNTER — Encounter: Payer: Self-pay | Admitting: *Deleted

## 2014-10-31 ENCOUNTER — Other Ambulatory Visit: Payer: Self-pay | Admitting: Family Medicine

## 2014-11-01 ENCOUNTER — Other Ambulatory Visit: Payer: Self-pay

## 2014-11-01 MED ORDER — TRAMADOL HCL 50 MG PO TABS: ORAL_TABLET | ORAL | Status: DC

## 2014-11-01 NOTE — Telephone Encounter (Signed)
Pt left v/m requesting refill tramadol to [facility]. Last seen 10/26/14 and rx last refill # 180 on 07/26/14.

## 2014-11-01 NOTE — Telephone Encounter (Signed)
Rx faxed to requested pharmacy 

## 2014-11-17 ENCOUNTER — Ambulatory Visit (INDEPENDENT_AMBULATORY_CARE_PROVIDER_SITE_OTHER): Payer: PPO | Admitting: Gastroenterology

## 2014-11-17 ENCOUNTER — Encounter: Payer: Self-pay | Admitting: Gastroenterology

## 2014-11-17 VITALS — Ht 62.75 in | Wt 147.5 lb

## 2014-11-17 DIAGNOSIS — R1314 Dysphagia, pharyngoesophageal phase: Secondary | ICD-10-CM | POA: Insufficient documentation

## 2014-11-17 NOTE — Patient Instructions (Signed)

## 2014-11-17 NOTE — Progress Notes (Signed)
11/17/2014 KATELEE SCHUPP [id number] 1946/03/20   HISTORY OF PRESENT ILLNESS:  This is a pleasant 69 year old female who is known to Dr. Deatra Ina for colonoscopy in 12/2010 at which time she was found to have only diverticulosis in the sigmoid colon.  She presents to our office today at the request of her PCP, Dr. Deborra Medina, with complaints of difficulty swallowing solid food.  Says that food gets stuck at times.  Started gradually several months ago but getting more frequent.  Eventually food will go down after small sips of water; feels like the clog is being released.  Denies any weight loss, abdominal pain, heartburn/reflux, or any other issues.  Never had any similar issues in the past.   Past Medical History  Diagnosis Date  . Hyperlipidemia   . Hypertension   . Hypothyroidism   . Osteoarthritis   . History of gynecologic surgery     had right ovary removed, unsure about uterus/cervix  . Osteopenia   . Colitis    Past Surgical History  Procedure Laterality Date  . Appendectomy    . Tubal ligation      bilat  . 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 Other surgical history      part of intestine removed  . Dilation and curettage of uterus    . Colon surgery  1998    colectomy for ? Diverticulits  . Hernia repair      right lower abd  . Oophorectomy      right    reports that she quit smoking about 7 years ago. She has never used smokeless tobacco. She reports that she drinks alcohol. She reports that she does not use illicit drugs. family history includes Heart attack in her father; Liver cancer in her brother; Pancreatic cancer in her mother. No Known Allergies    Outpatient Encounter Prescriptions as of 11/17/2014  Medication Sig  . acetaminophen (TYLENOL) 325 MG tablet Take 650 mg by mouth every 6 (six) hours as needed.    Marland Kitchen amLODipine (NORVASC) 10 MG tablet Take 1 tablet by mouth daily  . Ascorbic Acid (VITAMIN C) 1000 MG tablet Take 1,000 mg by mouth  daily.  Marland Kitchen aspirin 81 MG tablet Take 81 mg by mouth daily.    . Calcium Carbonate-Vit D-Min (CALCIUM 1200 PO) Takes 1200 mg's by mouth daily  . hydrochlorothiazide (HYDRODIURIL) 25 MG tablet Take 1 tablet by mouth daily  . levothyroxine (SYNTHROID, LEVOTHROID) 75 MCG tablet Take 1 tablet by mouth daily  . naproxen sodium (ANAPROX) 220 MG tablet Take 440 mg by mouth as needed. pain   . simvastatin (ZOCOR) 20 MG tablet Take 1/2 tablet (10mg ) by mouth at bedtime  . traMADol (ULTRAM) 50 MG tablet Take one tablet by mouth two times a day   No facility-administered encounter medications on file as of 11/17/2014.     REVIEW OF SYSTEMS  : All other systems reviewed and negative except where noted in the History of Present Illness.   PHYSICAL EXAM: Ht 5' 2.75" (1.594 m)  Wt 147 lb 8 oz (66.906 kg)  BMI 26.33 kg/m2 General: Well developed white female in no acute distress Head: Normocephalic and atraumatic Eyes:  Sclerae anicteric, conjunctiva pink. Ears: Normal auditory acuity Lungs: Clear throughout to auscultation Heart: Regular rate and rhythm Abdomen: Soft, non-distended.  Normal bowel sounds.  Non-tender. Musculoskeletal: Symmetrical with no gross deformities  Skin: No lesions on visible extremities Extremities: No  edema  Neurological: Alert oriented x 4, grossly non-focal Psychological:  Alert and cooperative. Normal mood and affect  ASSESSMENT AND PLAN: -Dysphagia:  To solid food.  Gradual onset but now more frequent.  Will schedule EGD with possible dilation with Dr. Deatra Ina to rule out stricture, Schatzki's ring, etc.  Will chew food well with small bites in the interim.   CC:  Lucille Passy, MD

## 2014-11-18 NOTE — Progress Notes (Signed)
Reviewed and agree with management. Kona Yusuf D. Ronalee Scheunemann, M.D., FACG  

## 2015-01-25 ENCOUNTER — Encounter: Payer: PPO | Admitting: Gastroenterology

## 2015-03-07 ENCOUNTER — Other Ambulatory Visit: Payer: Self-pay | Admitting: *Deleted

## 2015-03-07 MED ORDER — TRAMADOL HCL 50 MG PO TABS: ORAL_TABLET | ORAL | Status: DC

## 2015-03-07 NOTE — Telephone Encounter (Signed)
Rx faxed to pharmacy  

## 2015-03-07 NOTE — Telephone Encounter (Signed)
Patient left a voicemail that she needs a refill on her Tramadol . Pharmacy [facility] Last office visit 10/26/14 Last refill 11/01/14 #180

## 2015-04-04 ENCOUNTER — Encounter: Payer: Self-pay | Admitting: Gastroenterology

## 2015-05-25 ENCOUNTER — Ambulatory Visit (AMBULATORY_SURGERY_CENTER): Payer: Self-pay

## 2015-05-25 VITALS — Ht 62.5 in | Wt 143.4 lb

## 2015-05-25 DIAGNOSIS — R1310 Dysphagia, unspecified: Secondary | ICD-10-CM

## 2015-05-25 NOTE — Progress Notes (Signed)
No allergies to eggs or soy No diet/weight loss meds No home oxygen No past problems with anesthesia Never had EGD  Has email and internet; registered for emmi

## 2015-05-30 ENCOUNTER — Other Ambulatory Visit: Payer: Self-pay | Admitting: Family Medicine

## 2015-05-30 MED ORDER — TRAMADOL HCL 50 MG PO TABS: ORAL_TABLET | ORAL | Status: DC

## 2015-05-30 NOTE — Telephone Encounter (Signed)
Pt left v/m [facility]ol [facility]. Last refilled # 180 on 03/07/15. Last seen 10/26/2014.

## 2015-06-08 ENCOUNTER — Ambulatory Visit (AMBULATORY_SURGERY_CENTER): Payer: PPO | Admitting: Gastroenterology

## 2015-06-08 ENCOUNTER — Encounter: Payer: Self-pay | Admitting: Gastroenterology

## 2015-06-08 VITALS — BP 116/55 | HR 55 | Temp 97.5°F | Resp 17 | Ht 62.0 in | Wt 143.0 lb

## 2015-06-08 DIAGNOSIS — E669 Obesity, unspecified: Secondary | ICD-10-CM | POA: Diagnosis not present

## 2015-06-08 DIAGNOSIS — R1310 Dysphagia, unspecified: Secondary | ICD-10-CM

## 2015-06-08 DIAGNOSIS — E039 Hypothyroidism, unspecified: Secondary | ICD-10-CM | POA: Diagnosis not present

## 2015-06-08 DIAGNOSIS — I10 Essential (primary) hypertension: Secondary | ICD-10-CM | POA: Diagnosis not present

## 2015-06-08 MED ORDER — SODIUM CHLORIDE 0.9 % IV SOLN: 500.0000 mL | INTRAVENOUS | Status: DC

## 2015-06-08 NOTE — Progress Notes (Signed)
Called to room to assist during endoscopic procedure.  Patient ID and intended procedure confirmed with present staff. Received instructions for my participation in the procedure from the performing physician.  

## 2015-06-08 NOTE — Patient Instructions (Signed)
YOU HAD AN ENDOSCOPIC PROCEDURE TODAY AT [facility]:   Refer to the procedure report that was given to you for any specific questions about what was found during the examination.  If the procedure report does not answer your questions, please call your gastroenterologist to clarify.  If you requested that your care partner not be given the details of your procedure findings, then the procedure report has been included in a sealed envelope for you to review at your convenience later.  YOU SHOULD EXPECT: Please Note:  You might notice some irritation and congestion in your nose or some drainage.  This is from the oxygen used during your procedure.  There is no need for concern and it should clear up in a day or so.  SYMPTOMS TO REPORT IMMEDIATELY:    Following upper endoscopy (EGD)  Vomiting of blood or coffee ground material  New chest pain or pain under the shoulder blades  Painful or persistently difficult swallowing  New shortness of breath  Fever of 100F or higher  Black, tarry-looking stools  For urgent or emergent issues, a gastroenterologist can be reached at any hour by calling [phone].   DIET: Follow dilation diet  ACTIVITY:  You should plan to take it easy for the rest of today and you should NOT DRIVE or use heavy machinery until tomorrow (because of the sedation medicines used during the test).    FOLLOW UP: Our staff will call the number listed on your records the next business day following your procedure to check on you and address any questions or concerns that you may have regarding the information given to you following your procedure. If we do not reach you, we will leave a message.  However, if you are feeling well and you are not experiencing any problems, there is no need to return our call.  We will assume that you have returned to your regular daily activities without incident.  If any biopsies were taken you will be contacted by phone or by  letter within the next 1-3 weeks.  Please call us at [phone] if you have not heard about the biopsies in 3 weeks.    SIGNATURES/CONFIDENTIALITY: You and/or your care partner have signed paperwork which will be entered into your electronic medical record.  These signatures attest to the fact that that the information above on your After Visit Summary has been reviewed and is understood.  Full responsibility of the confidentiality of this discharge information lies with you and/or your care-partner.  Follow dilation diet Follow up in office in 3 months

## 2015-06-08 NOTE — Progress Notes (Signed)
Pt received in Recovery hypotensive but stable condition, airway and breathing maintained on RA Pt placed in trendelenburg position, NS fluids pushed to stabilize BP Bp improved 98/49, will cont to monitor

## 2015-06-08 NOTE — Progress Notes (Signed)
Rechecked BP post 1 liter NS fluids stabilized to 116/55 Pt is alert&oriented x3, slight drowsiness noted Iv site d/c'd, intact

## 2015-06-08 NOTE — Op Note (Signed)
[place]  [address] [place], [id number]   ENDOSCOPY PROCEDURE REPORT  PATIENT: Heather Potter, Heather Potter  MR#: [id number] BIRTHDATE: 24-Jul-1945 , 53  yrs. old GENDER: female ENDOSCOPIST: Harl Bowie, MD REFERRED BY:  Arnette Norris, M.D. PROCEDURE DATE:  06/08/2015 PROCEDURE:  EGD w/ balloon dilation ASA CLASS:     Class II INDICATIONS:  dysphagia. MEDICATIONS: Propofol 200 mg IV TOPICAL ANESTHETIC: none  DESCRIPTION OF PROCEDURE: After the risks benefits and alternatives of the procedure were thoroughly explained, informed consent was obtained.  The LB [id number] endoscope was introduced through the mouth and advanced to the second portion of the duodenum , Without limitations.  The instrument was slowly withdrawn as the mucosa was fully examined.   EXAM: The esophagus was normal in appearance. slight irregularity at squamocolumnar junction, no erosions or nodularity.no visible ring or stricture. Empirically dilated with TTS balloon to 18, 19 and 20 mm, no visible heme on the balloon on withdrawal.  The stomach was entered and closely examined.The antrum, angularis, and lesser curvature were well visualized, including a retroflexed view of the cardia and fundus.  The stomach wall was normally distensable.  The scope passed easily through the pylorus into the duodenum. Retroflexed views revealed no abnormalities.     The scope was then withdrawn from the patient and the procedure completed.  COMPLICATIONS: There were no immediate complications.  ENDOSCOPIC IMPRESSION: Normal appearing esophagus and GE junction, the stomach was well visualized and normal in appearance, normal appearing duodenum  RECOMMENDATIONS: Follow-up in office visit in 3 months if continues to have persistent dysphagia, will consider esophageal manometry for further evaluation    eSigned:  Harl Bowie, MD 06/08/2015 10:26 AM

## 2015-06-08 NOTE — Progress Notes (Signed)
Report to PACU, RN, vss, BBS= Clear.  

## 2015-06-09 ENCOUNTER — Telehealth: Payer: Self-pay

## 2015-06-09 NOTE — Telephone Encounter (Signed)
  Follow up Call-  Call back number 06/08/2015  Post procedure Call Back phone  # [phone]  Permission to leave phone message Yes    Patient states that she had a wonderful experience in the endoscopy center. She states that she was nervous and afraid, but everyone was so nice and reassuring that she was able to feel at peace with her procedure and she felt that she was in good capable hands throughout her stay.    Patient questions:  Do you have a fever, pain , or abdominal swelling? No. Pain Score  0 *  Have you tolerated food without any problems? Yes.    Have you been able to return to your normal activities? Yes.    Do you have any questions about your discharge instructions: Diet   No. Medications  No. Follow up visit  No.  Do you have questions or concerns about your Care? No.  Actions: * If pain score is 4 or above: No action needed, pain <4.

## 2015-07-06 ENCOUNTER — Encounter: Payer: Self-pay | Admitting: Family Medicine

## 2015-07-06 ENCOUNTER — Ambulatory Visit (INDEPENDENT_AMBULATORY_CARE_PROVIDER_SITE_OTHER): Payer: PPO | Admitting: Family Medicine

## 2015-07-06 VITALS — BP 122/74 | HR 64 | Temp 97.7°F | Ht 62.2 in | Wt 142.5 lb

## 2015-07-06 DIAGNOSIS — R1310 Dysphagia, unspecified: Secondary | ICD-10-CM

## 2015-07-06 DIAGNOSIS — Z0000 Encounter for general adult medical examination without abnormal findings: Secondary | ICD-10-CM | POA: Insufficient documentation

## 2015-07-06 DIAGNOSIS — E038 Other specified hypothyroidism: Secondary | ICD-10-CM

## 2015-07-06 DIAGNOSIS — Z905 Acquired absence of kidney: Secondary | ICD-10-CM | POA: Diagnosis not present

## 2015-07-06 DIAGNOSIS — M8580 Other specified disorders of bone density and structure, unspecified site: Secondary | ICD-10-CM

## 2015-07-06 DIAGNOSIS — Z1239 Encounter for other screening for malignant neoplasm of breast: Secondary | ICD-10-CM | POA: Diagnosis not present

## 2015-07-06 DIAGNOSIS — E785 Hyperlipidemia, unspecified: Secondary | ICD-10-CM

## 2015-07-06 DIAGNOSIS — I10 Essential (primary) hypertension: Secondary | ICD-10-CM

## 2015-07-06 DIAGNOSIS — M19019 Primary osteoarthritis, unspecified shoulder: Secondary | ICD-10-CM

## 2015-07-06 LAB — LIPID PANEL
CHOL/HDL RATIO: 3
CHOLESTEROL: 171 mg/dL (ref 0–200)
HDL: 65.8 mg/dL (ref >39.00)
LDL CALC: 84 mg/dL (ref 0–99)
NonHDL: 105.35
TRIGLYCERIDES: 108 mg/dL (ref 0.0–149.0)
VLDL: 21.6 mg/dL (ref 0.0–40.0)

## 2015-07-06 LAB — COMPREHENSIVE METABOLIC PANEL
ALT: 20 U/L (ref 0–35)
AST: 21 U/L (ref 0–37)
Albumin: 4.6 g/dL (ref 3.5–5.2)
Alkaline Phosphatase: 73 U/L (ref 39–117)
BUN: 14 mg/dL (ref 6–23)
CALCIUM: 10.3 mg/dL (ref 8.4–10.5)
CHLORIDE: 102 meq/L (ref 96–112)
CO2: 32 meq/L (ref 19–32)
CREATININE: 1.02 mg/dL (ref 0.40–1.20)
GFR: 56.96 mL/min — ABNORMAL LOW (ref >60.00)
Glucose, Bld: 90 mg/dL (ref 70–99)
POTASSIUM: 3.7 meq/L (ref 3.5–5.1)
SODIUM: 141 meq/L (ref 135–145)
Total Bilirubin: 0.7 mg/dL (ref 0.2–1.2)
Total Protein: 7.8 g/dL (ref 6.0–8.3)

## 2015-07-06 LAB — CBC
HEMATOCRIT: 41.9 % (ref 36.0–46.0)
HEMOGLOBIN: 13.9 g/dL (ref 12.0–15.0)
MCHC: 33.3 g/dL (ref 30.0–36.0)
MCV: 89.1 fl (ref 78.0–100.0)
Platelets: 253 10*3/uL (ref 150.0–400.0)
RBC: 4.7 Mil/uL (ref 3.87–5.11)
RDW: 13.3 % (ref 11.5–15.5)
WBC: 6.3 10*3/uL (ref 4.0–10.5)

## 2015-07-06 LAB — T4, FREE: Free T4: 1.24 ng/dL (ref 0.60–1.60)

## 2015-07-06 LAB — TSH: TSH: 2.12 u[IU]/mL (ref 0.35–4.50)

## 2015-07-06 NOTE — Assessment & Plan Note (Signed)
Continue current dose of synthroid. Labs today. 

## 2015-07-06 NOTE — Assessment & Plan Note (Addendum)
The patients weight, height, BMI and visual acuity have been recorded in the chart.  Cognitive function assessed.   I have made referrals, counseling and provided education to the patient based review of the above and I have provided the pt with a written personalized care plan for preventive services.  Mammogram ordered- pt to call to schedule.

## 2015-07-06 NOTE — Assessment & Plan Note (Signed)
Continue weight bearing exercises. DEXA ordered.

## 2015-07-06 NOTE — Patient Instructions (Signed)
Great to see you. Happy birthday!  Please call to schedule your mammogram and bone density.

## 2015-07-06 NOTE — Progress Notes (Signed)
70 yo pleasant female here for annual medicare wellness visit and follow up of chronic medical conditions.   I have personally reviewed the Medicare Annual Wellness questionnaire and have noted 1. The patient's medical and social history 2. Their use of alcohol, tobacco or illicit drugs 3. Their current medications and supplements 4. The patient's functional ability including ADL's, fall risks, home safety risks and hearing or visual             Impairment.- independent for all ADLs. 5. Diet and physical activities- very physically active 6. Evidence for depression or mood disorders- no signs or symptoms of depression or mood disorders.  End of life wishes discussed and updated in Social History.  The roster of all physicians providing medical care to patient - is listed in the CareTeams section of the chart.   Mammogram 03/03/14 Td 11/28/10 Colonoscopy (Dr. Deatra Ina)- 12/19/10- 10 year recall Pneumovax 11/28/10 Prevnar 13 08/18/13 Zostavax 11/28/10 DEXA 12/13/10 Last pap smear 10/28/2009  S/p esophageal dilatation last month.  Dysphagia has resolved.  Hypothyroidism- has been on Levothyroxine 75 micrograms for years.  TSH stable. Lab Results  Component Value Date   TSH 1.50 10/26/2014    HLD- on Simvastatin 20 mg daily and Fish oil 2000 mg daily.   Lab Results  Component Value Date   CHOL 182 10/26/2014   HDL 60.90 10/26/2014   LDLCALC 99 10/26/2014   TRIG 112.0 10/26/2014   CHOLHDL 3 10/26/2014   Lab Results  Component Value Date   WBC 8.8 03/21/2014   HGB 14.2 03/21/2014   HCT 43.6 03/21/2014   MCV 90.2 03/21/2014   PLT 291.0 03/21/2014     Patient Active Problem List   Diagnosis Date Noted  . Medicare annual wellness visit, subsequent 07/06/2015  . Dysphagia 10/26/2014  . Osteopenia   . Hypothyroidism 10/27/2009  . HLD (hyperlipidemia) 10/27/2009  . Essential hypertension 10/27/2009  . Osteoarthritis 10/27/2009  . NEPHRECTOMY, HX OF 10/27/2009   Past Medical  History  Diagnosis Date  . Hyperlipidemia   . Hypertension   . Hypothyroidism   . Osteoarthritis   . History of gynecologic surgery     had right ovary removed, unsure about uterus/cervix  . Osteopenia   . Colitis   . History of blood transfusion     D and C and miscarriage   Past Surgical History  Procedure Laterality Date  . Appendectomy    . Tubal ligation      bilat  . 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 Other surgical history      part of intestine removed  . Dilation and curettage of uterus    . Colon surgery  1998    colectomy for ? Diverticulits  . Hernia repair      right lower abd  . Oophorectomy      right   Social History  Substance Use Topics  . Smoking status: Former Smoker    Quit date: 05/21/2007  . Smokeless tobacco: Never Used  . Alcohol Use: Yes     Comment: rare- wine   Family History  Problem Relation Age of Onset  . Pancreatic cancer Mother   . Heart attack Father   . Liver cancer Brother   . Colon cancer Neg Hx    No Known Allergies Current Outpatient Prescriptions on File Prior to Visit  Medication Sig Dispense Refill  . acetaminophen (TYLENOL) 325 MG tablet Take 650 mg by mouth every 6 (six)  hours as needed.      Marland Kitchen amLODipine (NORVASC) 10 MG tablet Take 1 tablet by mouth daily 90 tablet 1  . Ascorbic Acid (VITAMIN C) 1000 MG tablet Take 1,000 mg by mouth daily. Reported on 06/08/2015    . aspirin 81 MG tablet Take 81 mg by mouth daily.      . hydrochlorothiazide (HYDRODIURIL) 25 MG tablet Take 1 tablet by mouth daily 90 tablet 1  . levothyroxine (SYNTHROID, LEVOTHROID) 75 MCG tablet Take 1 tablet by mouth daily 90 tablet 3  . naproxen sodium (ANAPROX) 220 MG tablet Take 440 mg by mouth as needed. Reported on 06/08/2015    . simvastatin (ZOCOR) 20 MG tablet Take 1/2 tablet by mouth at bedtime 45 tablet 1  . traMADol (ULTRAM) 50 MG tablet Take one tablet by mouth two times a day 180 tablet 0   No current  facility-administered medications on file prior to visit.   The PMH, PSH, Social History, Family History, Medications, and allergies have been reviewed in [facility], and have been updated if relevant.  Review of Systems  Constitutional: Negative.   HENT: Negative.   Respiratory: Negative.   Cardiovascular: Negative.   Gastrointestinal: Negative.   Endocrine: Negative.   Genitourinary: Negative.   Musculoskeletal: Negative.   Allergic/Immunologic: Negative.   Neurological: Negative.   Hematological: Negative.   Psychiatric/Behavioral: Negative.   All other systems reviewed and are negative.     Physical Exam BP 122/74 mmHg  Pulse 64  Temp(Src) 97.7 F (36.5 C) (Oral)  Ht 5' 2.2" (1.58 m)  Wt 142 lb 8 oz (64.638 kg)  BMI 25.89 kg/m2  SpO2 98%  General:  alert, well-developed, and overweight-appearing.   Head:  normocephalic, atraumatic, and no abnormalities observed.   Eyes:  vision grossly intact, pupils equal, and pupils round.   Ears:  R ear normal and L ear normal.   Nose:  no external deformity.   Mouth:  good dentition.   Neck:  No deformities, masses, or tenderness noted. Breasts:  No mass, nodules, thickening, tenderness, bulging, retraction, inflamation, nipple discharge or skin changes noted.   Lungs:  Normal respiratory effort, chest expands symmetrically. Lungs are clear to auscultation, no crackles or wheezes. Heart:  Normal rate and regular rhythm. S1 and S2 normal without gallop, murmur, click, rub or other extra sounds. Abdomen:  Bowel sounds positive,abdomen soft and non-tender without masses, organomegaly or hernias noted. Msk:  No deformity or scoliosis noted of thoracic or lumbar spine.   Extremities:  trace left pedal edema and trace right pedal edema.  trace left pedal edema.   enlarged DIP, PIP bilaterally, no redness or erythema. Neurologic:  alert & oriented X3.   Skin:  Intact without suspicious lesions or rashes Psych:  Cognition and judgment appear  intact. Alert and cooperative with normal attention span and concentration. No apparent delusions, illusions, hallucinations

## 2015-07-06 NOTE — Assessment & Plan Note (Signed)
Well controlled. No changes made to rxs today. 

## 2015-07-06 NOTE — Assessment & Plan Note (Signed)
Labs today

## 2015-07-06 NOTE — Progress Notes (Signed)
Pre visit review using our clinic review tool, if applicable. No additional management support is needed unless otherwise documented below in the visit note. 

## 2015-07-06 NOTE — Assessment & Plan Note (Signed)
Resolved s/p esophageal dilatation.

## 2015-07-07 LAB — HEPATITIS C ANTIBODY: HCV AB: NEGATIVE

## 2015-07-12 ENCOUNTER — Ambulatory Visit
Admission: RE | Admit: 2015-07-12 | Discharge: 2015-07-12 | Disposition: A | Discharge status: Home / Self Care | Payer: PPO | Source: Ambulatory Visit | Attending: Family Medicine | Admitting: Family Medicine

## 2015-07-12 ENCOUNTER — Encounter: Payer: Self-pay | Admitting: Gastroenterology

## 2015-07-12 ENCOUNTER — Encounter: Payer: Self-pay | Admitting: *Deleted

## 2015-07-12 DIAGNOSIS — Z13820 Encounter for screening for osteoporosis: Secondary | ICD-10-CM | POA: Diagnosis not present

## 2015-07-12 DIAGNOSIS — M8580 Other specified disorders of bone density and structure, unspecified site: Secondary | ICD-10-CM | POA: Diagnosis not present

## 2015-07-12 DIAGNOSIS — Z1239 Encounter for other screening for malignant neoplasm of breast: Secondary | ICD-10-CM

## 2015-07-12 DIAGNOSIS — Z1231 Encounter for screening mammogram for malignant neoplasm of breast: Secondary | ICD-10-CM | POA: Insufficient documentation

## 2015-07-12 DIAGNOSIS — M85832 Other specified disorders of bone density and structure, left forearm: Secondary | ICD-10-CM | POA: Diagnosis not present

## 2015-07-12 IMAGING — MG MM DIGITAL SCREENING BILAT W/ CAD
4 series · 4 of 4 positions shown · non-contrast
Comparison: Previous exam(s).

CLINICAL DATA: Screening.

EXAM:
DIGITAL SCREENING BILATERAL MAMMOGRAM WITH CAD

[L CC]
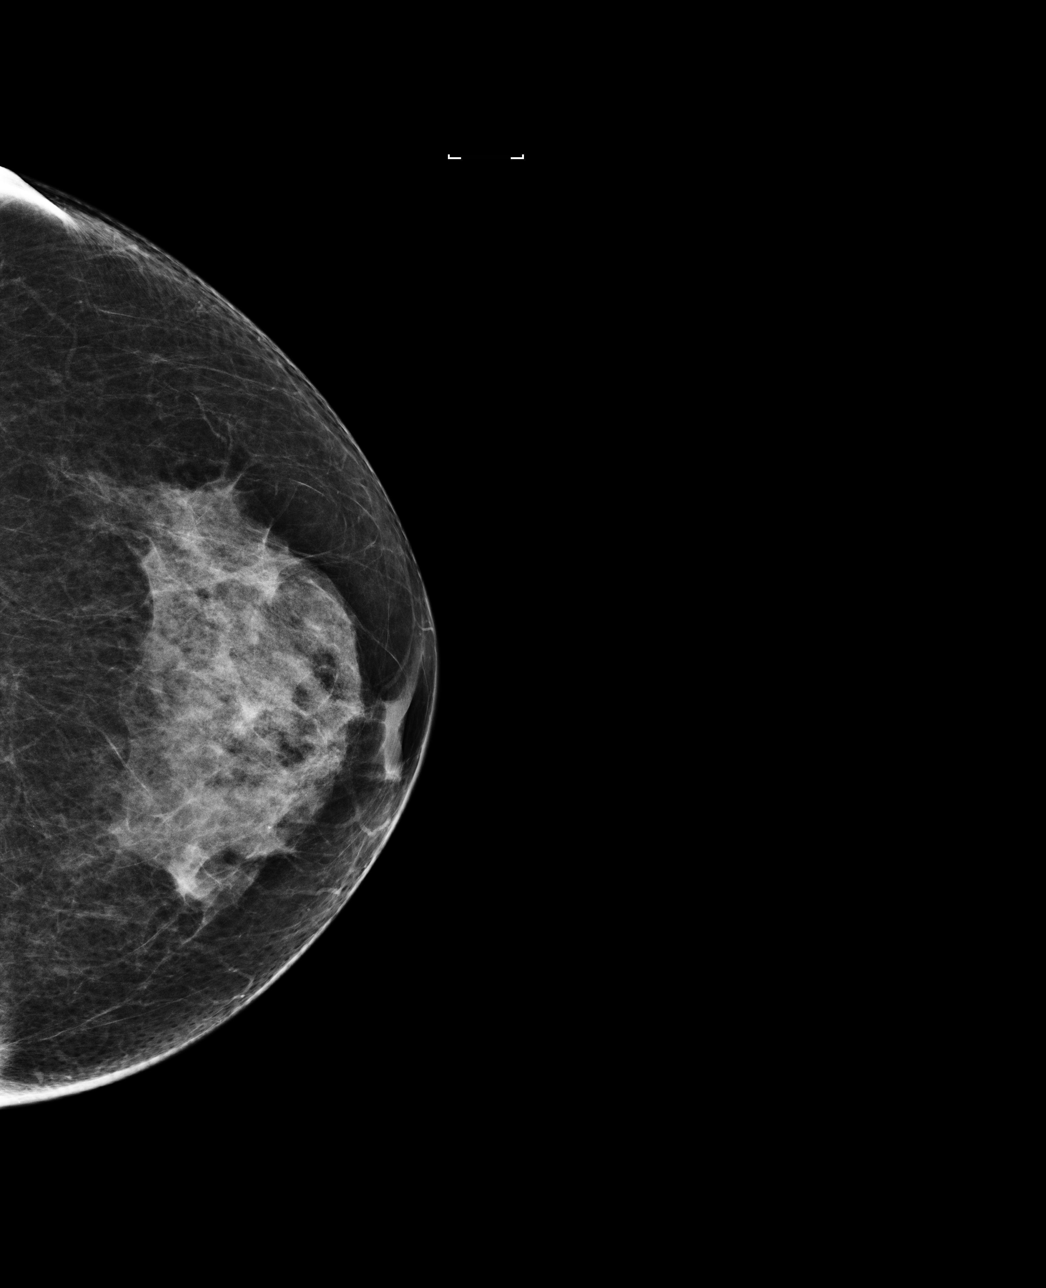

[R MLO]
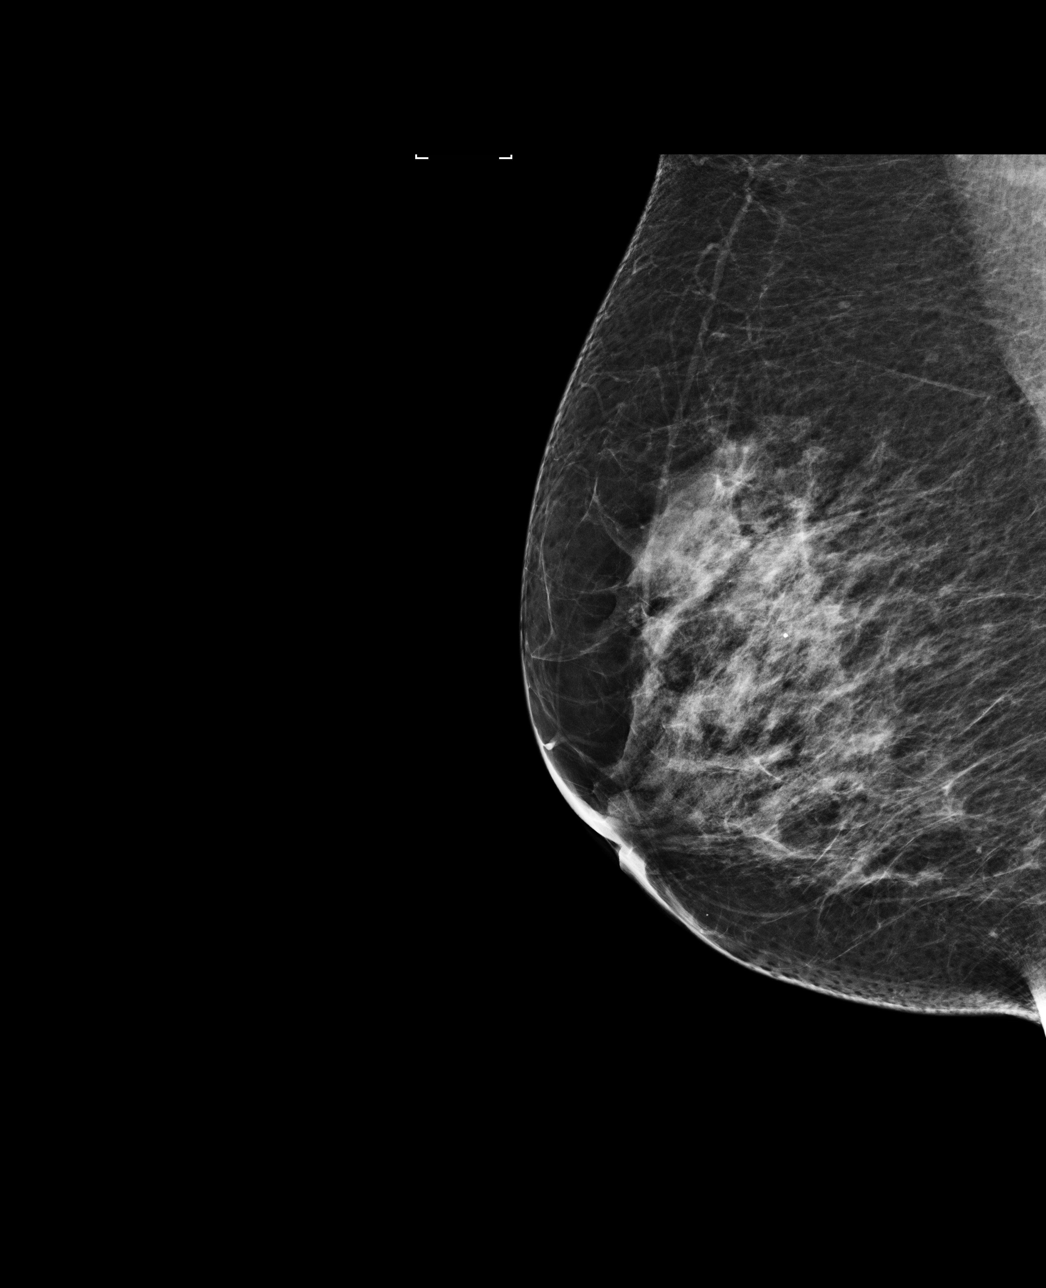

[L MLO]
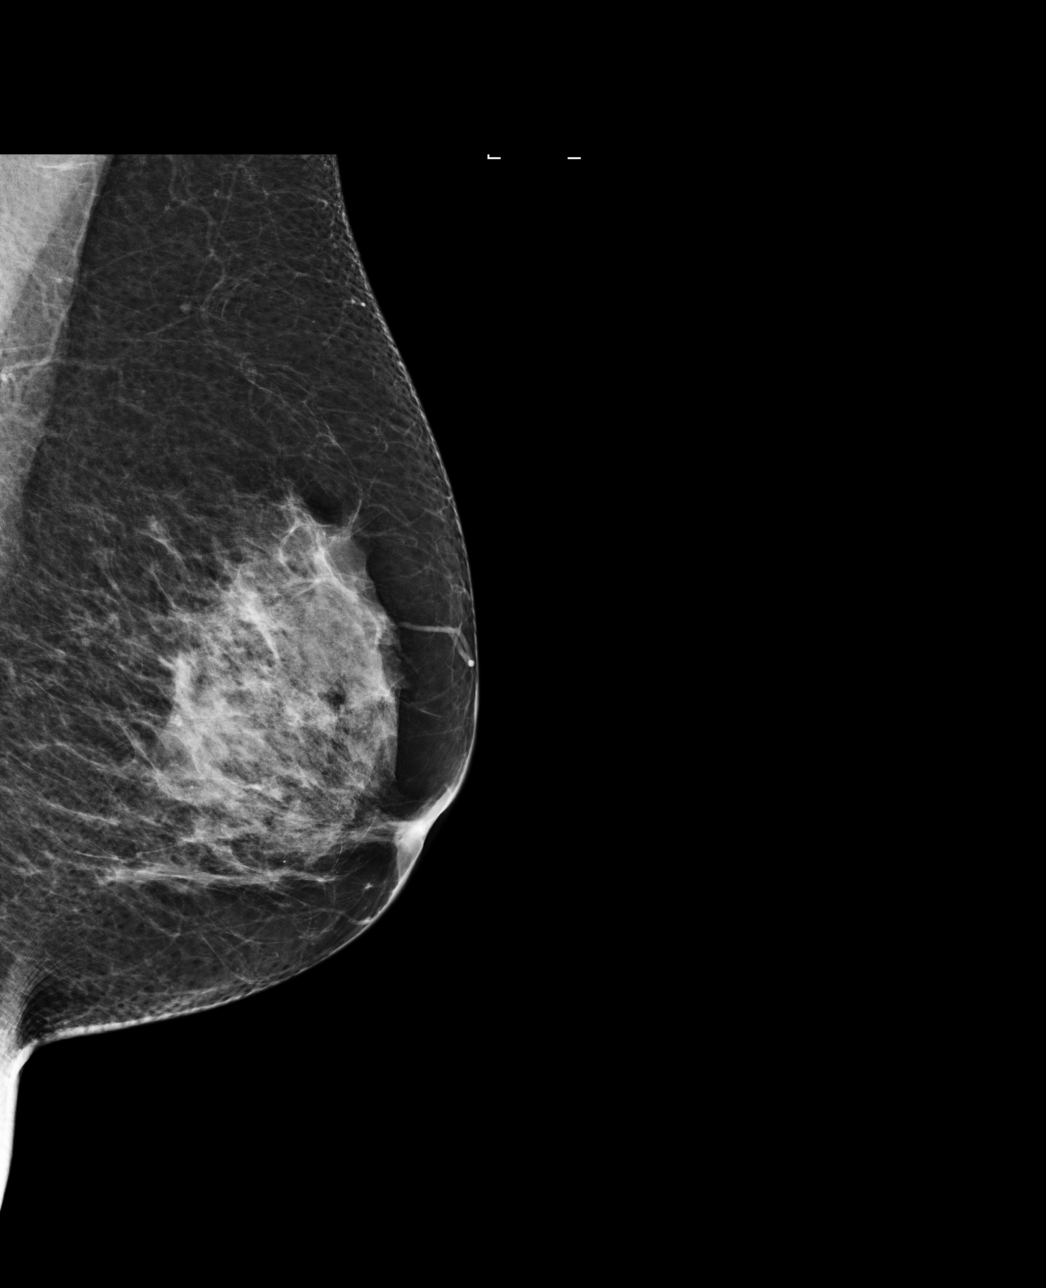

[R CC]
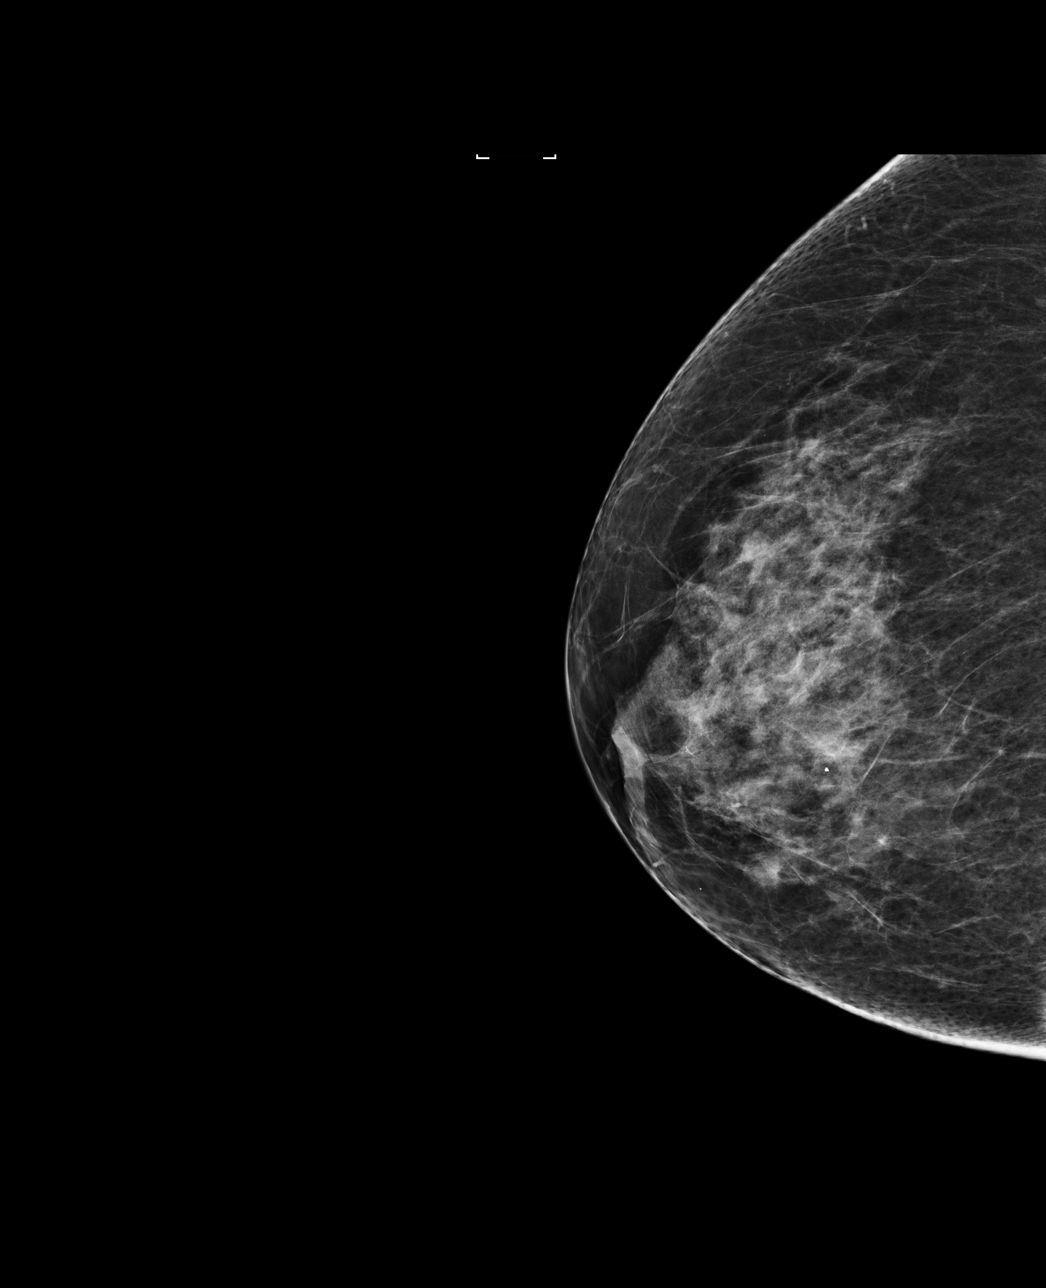

[4 of 4 positions shown; findings below may reference images not displayed]

ACR Breast Density Category c: The breast tissue is heterogeneously
dense, which may obscure small masses.
FINDINGS: There are no findings suspicious for malignancy. Images were
processed with CAD.
IMPRESSION: No mammographic evidence of malignancy. A result letter of this
screening mammogram will be mailed directly to the patient.

RECOMMENDATION:
Screening mammogram in one year. (Code:[0J])

BI-RADS CATEGORY  1: Negative.

## 2015-08-09 ENCOUNTER — Telehealth: Payer: Self-pay | Admitting: Gastroenterology

## 2015-08-09 NOTE — Telephone Encounter (Signed)
EGD with dilation performed in January. She was advised to follow up in 3 months. Manometry considered if symptoms do not improve. She reports despite cautious eating she will have spells where she cannot swallow and she feels like she cannot breathe. She calls for the appointment (there are not any openings). Please advise on how to proceed.

## 2015-08-14 NOTE — Telephone Encounter (Signed)
Please schedule for esophageal manometry and office follow up after next available appt. Thanks

## 2015-08-14 NOTE — Telephone Encounter (Signed)
Spouse of the patient notified of the following: Esophageal manometry at [facility] 08/21/15 at 8:30 - NPO after midnight-arrive at 8:00 am to [place] visit with Dr Silverio Decamp 09/06/15 at 9:00 am

## 2015-08-15 ENCOUNTER — Other Ambulatory Visit: Payer: Self-pay

## 2015-08-15 MED ORDER — TRAMADOL HCL 50 MG PO TABS: ORAL_TABLET | ORAL | Status: DC

## 2015-08-15 NOTE — Telephone Encounter (Signed)
Pt left v/m requesting refill tramadol to [facility]. Last printed # 180 on 05/30/15. Last annual exam on 07/06/15.

## 2015-08-16 MED ORDER — TRAMADOL HCL 50 MG PO TABS: ORAL_TABLET | ORAL | Status: DC

## 2015-08-16 NOTE — Addendum Note (Signed)
Addended by: Modena Nunnery on: 08/16/2015 09:50 AM   Modules accepted: Orders

## 2015-08-16 NOTE — Telephone Encounter (Signed)
Rx reprinted for Dr Hulen Shouts sig to be sent to mail order.

## 2015-08-16 NOTE — Telephone Encounter (Signed)
Rx faxed to requested pharmacy 

## 2015-08-21 ENCOUNTER — Encounter (HOSPITAL_COMMUNITY)
Admission: RE | Disposition: A | Discharge status: Home / Self Care | Payer: Self-pay | Source: Ambulatory Visit | Attending: Gastroenterology

## 2015-08-21 ENCOUNTER — Ambulatory Visit (HOSPITAL_COMMUNITY)
Admission: RE | Admit: 2015-08-21 | Discharge: 2015-08-21 | Disposition: A | Discharge status: Home / Self Care | Payer: PPO | Source: Ambulatory Visit | Attending: Gastroenterology | Admitting: Gastroenterology

## 2015-08-21 DIAGNOSIS — R1310 Dysphagia, unspecified: Secondary | ICD-10-CM | POA: Insufficient documentation

## 2015-08-21 HISTORY — PX: ESOPHAGEAL MANOMETRY: SHX5429

## 2015-08-21 SURGERY — MANOMETRY, ESOPHAGUS
Anesthesia: Choice

## 2015-08-21 MED ORDER — LIDOCAINE VISCOUS 2 % MT SOLN
OROMUCOSAL | Status: AC
  Filled 2015-08-21: qty 15

## 2015-08-21 SURGICAL SUPPLY — 2 items
FACESHIELD LNG OPTICON STERILE (SAFETY) IMPLANT
GLOVE BIO SURGEON STRL SZ8 (GLOVE) ×4 IMPLANT

## 2015-08-23 ENCOUNTER — Encounter (HOSPITAL_COMMUNITY): Payer: Self-pay | Admitting: Gastroenterology

## 2015-09-06 ENCOUNTER — Ambulatory Visit (INDEPENDENT_AMBULATORY_CARE_PROVIDER_SITE_OTHER): Payer: PPO | Admitting: Gastroenterology

## 2015-09-06 ENCOUNTER — Encounter: Payer: Self-pay | Admitting: Gastroenterology

## 2015-09-06 VITALS — BP 100/62 | HR 66 | Ht 62.75 in | Wt 146.0 lb

## 2015-09-06 DIAGNOSIS — K224 Dyskinesia of esophagus: Secondary | ICD-10-CM | POA: Diagnosis not present

## 2015-09-06 DIAGNOSIS — K2100 Gastro-esophageal reflux disease with esophagitis, without bleeding: Secondary | ICD-10-CM

## 2015-09-06 DIAGNOSIS — R1310 Dysphagia, unspecified: Secondary | ICD-10-CM | POA: Diagnosis not present

## 2015-09-06 NOTE — Progress Notes (Signed)
Heather Potter    [id number]    09-Oct-1945  Primary Care Physician:Talia Deborra Medina, MD  Referring Physician: Lucille Passy, MD [place], [place] [address]  Chief complaint: Dysphagia, esophageal spasm  HPI: 41 yr F previously followed by Dr Deatra Ina, last seen by Alonza Bogus in 10/2014 is here for follow up visit. She continues to have occasional dysphagia or choking sensation with both solids and liquids. The episodes are very infrequent. She feels slightly better since EGD with esophageal dilation. She feels anxiety or stress brings on the episodes. Denies any food impactions. She is currently taking ranitidine as needed for infrequent heartburn. Reviewed the results of esophageal manometry with patient, was unremarkable. Her weight is stable. Denies any nausea, vomiting, abdominal pain, melena or bright red blood per rectum    Outpatient Encounter Prescriptions as of 09/06/2015  Medication Sig  . amLODipine (NORVASC) 10 MG tablet Take 1 tablet by mouth daily  . aspirin 81 MG tablet Take 81 mg by mouth daily.    . hydrochlorothiazide (HYDRODIURIL) 25 MG tablet Take 1 tablet by mouth daily  . levothyroxine (SYNTHROID, LEVOTHROID) 75 MCG tablet Take 1 tablet by mouth daily  . simvastatin (ZOCOR) 20 MG tablet Take 1/2 tablet by mouth at bedtime  . traMADol (ULTRAM) 50 MG tablet Take one tablet by mouth two times a day  . acetaminophen (TYLENOL) 325 MG tablet Take 325 mg by mouth every 6 (six) hours as needed. As needed  . [DISCONTINUED] Ascorbic Acid (VITAMIN C) 1000 MG tablet Take 1,000 mg by mouth daily. Reported on 09/06/2015  . [DISCONTINUED] naproxen sodium (ANAPROX) 220 MG tablet Take 440 mg by mouth as needed. Reported on 09/06/2015   No facility-administered encounter medications on file as of 09/06/2015.    Allergies as of 09/06/2015  . (No Known Allergies)    Past Medical History  Diagnosis Date  . Hyperlipidemia   . Hypertension   . Hypothyroidism     . Osteoarthritis   . History of gynecologic surgery     had right ovary removed, unsure about uterus/cervix  . Osteopenia   . Colitis   . History of blood transfusion     D and C and miscarriage    Past Surgical History  Procedure Laterality Date  . Appendectomy    . Tubal ligation      bilat  . 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 Other surgical history      part of intestine removed  . Dilation and curettage of uterus    . Colon surgery  1998    colectomy for ? Diverticulits  . Hernia repair      right lower abd  . Oophorectomy      right  . Esophageal manometry N/A 08/21/2015    Procedure: ESOPHAGEAL MANOMETRY (EM);  Surgeon: Mauri Pole, MD;  Location: [facility];  Service: Endoscopy;  Laterality: N/A;    Family History  Problem Relation Age of Onset  . Pancreatic cancer Mother   . Heart attack Father   . Liver cancer Brother   . Colon cancer Neg Hx   . Breast cancer Neg Hx     Social History   Social History  . Marital Status: Married    Spouse Name: N/A  . Number of Children: 1  . Years of Education: N/A   Occupational History  . Homemaker    Social  History Main Topics  . Smoking status: Former Smoker    Quit date: 05/21/2007  . Smokeless tobacco: Never Used  . Alcohol Use: Yes     Comment: rare- wine  . Drug Use: No  . Sexual Activity: Not on file   Other Topics Concern  . Not on file   Social History Narrative   One adopted son   Regular exercise: yes      Does not have a living will or HPOA.   Desires CPR and life support for only short period if not futile.            Review of systems: Review of Systems  Constitutional: Negative for fever and chills.  HENT: Negative.   Eyes: Negative for blurred vision.  Respiratory: Negative for cough, shortness of breath and wheezing.   Cardiovascular: Negative for chest pain and palpitations.  Gastrointestinal: as per HPI Genitourinary: Negative for dysuria, urgency,  frequency and hematuria.  Musculoskeletal: Negative for myalgias, back pain and joint pain.  Skin: Negative for itching and rash.  Neurological: Negative for dizziness, tremors, focal weakness, seizures and loss of consciousness.  Endo/Heme/Allergies: Negative for environmental allergies.  Psychiatric/Behavioral: Negative for depression, suicidal ideas and hallucinations.  All other systems reviewed and are negative.   Physical Exam: Filed Vitals:   09/06/15 0837  BP: 100/62  Pulse: 66   Gen:      No acute distress HEENT:  EOMI, sclera anicteric Neck:     No masses; no thyromegaly Lungs:    Clear to auscultation bilaterally; normal respiratory effort CV:         Regular rate and rhythm; no murmurs Abd:      + bowel sounds; soft, non-tender; no palpable masses, no distension Ext:    No edema; adequate peripheral perfusion Skin:      Warm and dry; no rash Neuro: alert and oriented x 3 Psych: normal mood and affect  Data Reviewed:  Reviewed chart in epic   Assessment and Plan/Recommendations:  70 year old female with history of GERD with complaints of intermittent esophageal spasm and dysphagia Infrequent episodes of esophageal spasm and dysphagia likely related to gastroesophageal acid reflux. No significant pathology identified on EGD or esophageal manometry continue ranitidine as needed but if she starts developing frequent episodes of heartburn or esophageal spasms will have to consider starting low-dose PPI daily Also recommended peppermint tea as needed to help with esophageal spasms Follow antireflux measures Due for screening colonoscopy in 2022 Return as needed  K. Denzil Magnuson , MD [phone] Mon-Fri 8a-5p [phone] after 5p, weekends, holidays

## 2015-09-06 NOTE — Patient Instructions (Signed)
Follow up as needed

## 2015-09-13 ENCOUNTER — Encounter: Payer: Self-pay | Admitting: Internal Medicine

## 2015-09-13 ENCOUNTER — Ambulatory Visit (INDEPENDENT_AMBULATORY_CARE_PROVIDER_SITE_OTHER): Payer: PPO | Admitting: Internal Medicine

## 2015-09-13 ENCOUNTER — Telehealth: Payer: Self-pay | Admitting: Family Medicine

## 2015-09-13 VITALS — BP 118/70 | HR 75 | Temp 97.9°F | Wt 146.0 lb

## 2015-09-13 DIAGNOSIS — I8390 Asymptomatic varicose veins of unspecified lower extremity: Secondary | ICD-10-CM | POA: Insufficient documentation

## 2015-09-13 DIAGNOSIS — I8392 Asymptomatic varicose veins of left lower extremity: Secondary | ICD-10-CM

## 2015-09-13 NOTE — Telephone Encounter (Signed)
Pt has appt to see Dr Silvio Pate on 09/13/15 at 11:15.

## 2015-09-13 NOTE — Assessment & Plan Note (Signed)
Reassured---no risk of PE No action needed unless other symptoms like evening swelling or aching

## 2015-09-13 NOTE — Telephone Encounter (Signed)
Will check at OV 

## 2015-09-13 NOTE — Patient Instructions (Signed)
Varicose Veins Varicose veins are veins that have become enlarged and twisted. They are usually seen in the legs but can occur in other parts of the body as well. CAUSES This condition is the result of valves in the veins not working properly. Valves in the veins help to return blood from the leg to the heart. If these valves are damaged, blood flows backward and backs up into the veins in the leg near the skin. This causes the veins to become larger. RISK FACTORS People who are on their feet a lot, who are pregnant, or who are overweight are more likely to develop varicose veins. SIGNS AND SYMPTOMS  Bulging, twisted-appearing, bluish veins, most commonly found on the legs.  Leg pain or a feeling of heaviness. These symptoms may be worse at the end of the day.  Leg swelling.  Changes in skin color. DIAGNOSIS A health care provider can usually diagnose varicose veins by examining your legs. Your health care provider may also recommend an ultrasound of your leg veins. TREATMENT Most varicose veins can be treated at home.However, other treatments are available for people who have persistent symptoms or want to improve the cosmetic appearance of the varicose veins. These treatment options include:  Sclerotherapy. A solution is injected into the vein to close it off.  Laser treatment. A laser is used to heat the vein to close it off.  Radiofrequency vein ablation. An electrical current produced by radio waves is used to close off the vein.  Phlebectomy. The vein is surgically removed through small incisions made over the varicose vein.  Vein ligation and stripping. The vein is surgically removed through incisions made over the varicose vein after the vein has been tied (ligated). HOME CARE INSTRUCTIONS  Do not stand or sit in one position for long periods of time. Do not sit with your legs crossed. Rest with your legs raised during the day.  Wear compression stockings as directed by your  health care provider. These stockings help to prevent blood clots and reduce swelling in your legs.  Do not wear other tight, encircling garments around your legs, pelvis, or waist.  Walk as much as possible to increase blood flow.  Raise the foot of your bed at night with 2-inch blocks.  If you get a cut in the skin over the vein and the vein bleeds, lie down with your leg raised and press on it with a clean cloth until the bleeding stops. Then place a bandage (dressing) on the cut. See your health care provider if it continues to bleed. SEEK MEDICAL CARE IF:  The skin around your ankle starts to break down.  You have pain, redness, tenderness, or hard swelling in your leg over a vein.  You are uncomfortable because of leg pain.   This information is not intended to replace advice given to you by your health care provider. Make sure you discuss any questions you have with your health care provider.   Document Released: 02/13/2005 Document Revised: 05/27/2014 Document Reviewed: 09/21/2013 Elsevier Interactive Patient Education 2016 Elsevier Inc.  

## 2015-09-13 NOTE — Progress Notes (Signed)
Subjective:    Patient ID: Heather Potter, female    DOB: 08/27/1945, 70 y.o.   MRN: [id number]  HPI Here due to concerning nodule on her left calf  Noticed it this morning No pain--may have slight tenderness though No leg swelling No recent travel No problems with varicose veins  Current Outpatient Prescriptions on File Prior to Visit  Medication Sig Dispense Refill  . acetaminophen (TYLENOL) 325 MG tablet Take 325 mg by mouth every 6 (six) hours as needed. As needed    . amLODipine (NORVASC) 10 MG tablet Take 1 tablet by mouth daily 90 tablet 1  . aspirin 81 MG tablet Take 81 mg by mouth daily.      . hydrochlorothiazide (HYDRODIURIL) 25 MG tablet Take 1 tablet by mouth daily 90 tablet 1  . levothyroxine (SYNTHROID, LEVOTHROID) 75 MCG tablet Take 1 tablet by mouth daily 90 tablet 3  . simvastatin (ZOCOR) 20 MG tablet Take 1/2 tablet by mouth at bedtime 45 tablet 1  . traMADol (ULTRAM) 50 MG tablet Take one tablet by mouth two times a day 180 tablet 0   No current facility-administered medications on file prior to visit.    No Known Allergies  Past Medical History  Diagnosis Date  . Hyperlipidemia   . Hypertension   . Hypothyroidism   . Osteoarthritis   . History of gynecologic surgery     had right ovary removed, unsure about uterus/cervix  . Osteopenia   . Colitis   . History of blood transfusion     D and C and miscarriage    Past Surgical History  Procedure Laterality Date  . Appendectomy    . Tubal ligation      bilat  . 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 Other surgical history      part of intestine removed  . Dilation and curettage of uterus    . Colon surgery  1998    colectomy for ? Diverticulits  . Hernia repair      right lower abd  . Oophorectomy      right  . Esophageal manometry N/A 08/21/2015    Procedure: ESOPHAGEAL MANOMETRY (EM);  Surgeon: Mauri Pole, MD;  Location: [facility];  Service: Endoscopy;  Laterality:  N/A;    Family History  Problem Relation Age of Onset  . Pancreatic cancer Mother   . Heart attack Father   . Liver cancer Brother   . Colon cancer Neg Hx   . Breast cancer Neg Hx     Social History   Social History  . Marital Status: Married    Spouse Name: N/A  . Number of Children: 1  . Years of Education: N/A   Occupational History  . Homemaker    Social History Main Topics  . Smoking status: Former Smoker    Quit date: 05/21/2007  . Smokeless tobacco: Never Used  . Alcohol Use: Yes     Comment: rare- wine  . Drug Use: No  . Sexual Activity: Not on file   Other Topics Concern  . Not on file   Social History Narrative   One adopted son   Regular exercise: yes      Does not have a living will or HPOA.   Desires CPR and life support for only short period if not futile.         Review of Systems Active all the time--no change in exercise tolerance No chest  pain No SOB    Objective:   Physical Exam  Musculoskeletal: She exhibits no edema.  Small varicosity with clot in medial mid left calf No tenderness or inflammation          Assessment & Plan:

## 2015-09-13 NOTE — Telephone Encounter (Signed)
Patient Name: Heather Potter DOB: 07-31-1945 Initial Comment Caller states she has hard lump on lower leg, feels tender, concerned about blood clot Nurse Assessment Nurse: Vallery Sa, RN, Cathy Date/Time (Eastern Time): 09/13/2015 8:29:48 AM Confirm and document reason for call. If symptomatic, describe symptoms. You must click the next button to save text entered. ---Caller states she developed a swollen lump on her left lower leg this morning (rated as a 4/5 on the 1 to 10 scale). No injury in the past 3 days. No severe breathing difficulty. No chest pain. No fever. Has the patient traveled out of the country within the last 30 days? ---No Does the patient have any new or worsening symptoms? ---Yes Will a triage be completed? ---Yes Related visit to physician within the last 2 weeks? ---No Does the PT have any chronic conditions? (i.e. diabetes, asthma, etc.) ---Yes List chronic conditions. ---Thyroid problems, Throat problems Is this a behavioral health or substance abuse call? ---No Guidelines Guideline Title Affirmed Question Affirmed Notes Leg Pain [1] Thigh or calf pain AND [2] only 1 side AND [3] present > 1 hour Final Disposition User See Physician within 4 Hours (or PCP triage) Vallery Sa, RN, Cathy Comments Scheduled for 11:15am appointment with Dr. Silvio Pate today. Care advice given. She will call [phone] if she develops shortness of breath or chest pain. Referrals REFERRED TO PCP OFFICE Disagree/Comply: Comply

## 2015-09-13 NOTE — Progress Notes (Signed)
Pre visit review using our clinic review tool, if applicable. No additional management support is needed unless otherwise documented below in the visit note. 

## 2015-09-28 ENCOUNTER — Telehealth: Payer: Self-pay | Admitting: Family Medicine

## 2015-09-28 NOTE — Telephone Encounter (Signed)
Spoke to pt. She appreciated the call.  

## 2015-09-28 NOTE — Telephone Encounter (Signed)
Pt called back, lt leg feels like has pins and needles from knee down; same lump is there as when seen on 09/13/15. Pt does not have pain, swelling and no problems walking.No CP or SOB. If pt condition changes or worsens prior to cb pt will go to ED if needed. Pt request cb. [facility] [address].

## 2015-09-28 NOTE — Telephone Encounter (Signed)
The lump was a clot in a vein and may not go away It may help to compress it with an ACE wrap and keep it up She can try warm compresses also. Nothing serious comes to mind with the pins and needles--- unless bleeding or CP/SOB, probably no emergency or even absolute reason for OV

## 2015-09-28 NOTE — Telephone Encounter (Signed)
Unable to reach pt by phone. Pt saw Dr Silvio Pate on 09/13/15.There are no available appt at [facility] on 09/29/15.

## 2015-09-28 NOTE — Telephone Encounter (Signed)
Patient Name: Heather Potter  DOB: 02/15/1946    Initial Comment Caller states saw dr for lump on leg. Has pins and needles in leg   Nurse Assessment  Nurse: Raphael Gibney, RN, Vanita Ingles Date/Time (Eastern Time): 09/28/2015 1:41:27 PM  Confirm and document reason for call. If symptomatic, describe symptoms. You must click the next button to save text entered. ---Caller states she saw doctor about 3 weeks ago for a lump on her left leg Still has the lump. She is having "pins and needles in her left leg. " Has had pins and needles for several days that starts behind her knee and goes down to her foot.  Has the patient traveled out of the country within the last 30 days? ---Not Applicable  Does the patient have any new or worsening symptoms? ---Yes  Will a triage be completed? ---Yes  Related visit to physician within the last 2 weeks? ---No  Does the PT have any chronic conditions? (i.e. diabetes, asthma, etc.) ---Yes  List chronic conditions. ---HTN; thyroid problems  Is this a behavioral health or substance abuse call? ---No     Guidelines    Guideline Title Affirmed Question Affirmed Notes  Neurologic Deficit [1] Tingling (e.g., pins and needles) of the face, arm / hand, or leg / foot on one side of the body AND [2] present now    Final Disposition User   See Physician within 4 Hours (or PCP triage) Raphael Gibney, RN, Vera    Comments  Pt can not come this afternoon for an appt and does not want to go to urgent care or ER but would prefer to see Dr. Deborra Medina tomorrow. Please call pt back regarding appt.   Referrals  GO TO FACILITY REFUSED   Disagree/Comply: Comply

## 2015-10-11 DIAGNOSIS — M7989 Other specified soft tissue disorders: Secondary | ICD-10-CM | POA: Diagnosis not present

## 2015-10-11 DIAGNOSIS — S90811A Abrasion, right foot, initial encounter: Secondary | ICD-10-CM | POA: Diagnosis not present

## 2015-10-11 DIAGNOSIS — S99921A Unspecified injury of right foot, initial encounter: Secondary | ICD-10-CM | POA: Diagnosis not present

## 2015-10-11 DIAGNOSIS — M79671 Pain in right foot: Secondary | ICD-10-CM | POA: Diagnosis not present

## 2015-10-11 DIAGNOSIS — S9031XA Contusion of right foot, initial encounter: Secondary | ICD-10-CM | POA: Diagnosis not present

## 2015-10-12 DIAGNOSIS — M8580 Other specified disorders of bone density and structure, unspecified site: Secondary | ICD-10-CM | POA: Insufficient documentation

## 2015-10-17 DIAGNOSIS — S9031XD Contusion of right foot, subsequent encounter: Secondary | ICD-10-CM | POA: Diagnosis not present

## 2015-12-04 ENCOUNTER — Other Ambulatory Visit: Payer: Self-pay | Admitting: Family Medicine

## 2015-12-04 MED ORDER — TRAMADOL HCL 50 MG PO TABS: ORAL_TABLET | ORAL | Status: DC

## 2015-12-04 NOTE — Telephone Encounter (Signed)
Rx faxed to requested pharmacy 

## 2015-12-04 NOTE — Addendum Note (Signed)
Addended by: Modena Nunnery on: 12/04/2015 12:31 PM   Modules accepted: Orders

## 2015-12-04 NOTE — Telephone Encounter (Signed)
Pt left v/m requesting rx for tramadol sent to [facility] order pharmacy. Last printed # 180 on 08/16/15. Last annual 07/06/15.

## 2016-01-26 DIAGNOSIS — L430 Hypertrophic lichen planus: Secondary | ICD-10-CM | POA: Diagnosis not present

## 2016-02-06 ENCOUNTER — Ambulatory Visit (INDEPENDENT_AMBULATORY_CARE_PROVIDER_SITE_OTHER): Payer: PPO

## 2016-02-06 DIAGNOSIS — Z23 Encounter for immunization: Secondary | ICD-10-CM | POA: Diagnosis not present

## 2016-04-03 ENCOUNTER — Other Ambulatory Visit: Payer: Self-pay

## 2016-04-03 MED ORDER — TRAMADOL HCL 50 MG PO TABS: ORAL_TABLET | ORAL | 0 refills | Status: DC

## 2016-04-03 NOTE — Telephone Encounter (Signed)
Pt left v/m requesting refill [facility]. Last refilled # 180 on 12/04/15. Last annual 07/06/15.

## 2016-04-03 NOTE — Telephone Encounter (Signed)
Rx faxed to mail order pharmacy. 

## 2016-04-26 DIAGNOSIS — Z5181 Encounter for therapeutic drug level monitoring: Secondary | ICD-10-CM | POA: Diagnosis not present

## 2016-04-26 DIAGNOSIS — L430 Hypertrophic lichen planus: Secondary | ICD-10-CM | POA: Diagnosis not present

## 2016-05-29 IMAGING — US US ABDOMEN COMPLETE
1 series · 14 of 25 positions shown · non-contrast
Comparison: None.

CLINICAL DATA: Right-sided abdominal pain for 1 month. Prior right
kidney donation.

EXAM:
ABDOMEN ULTRASOUND COMPLETE

[Series 1: us abdomen complete · 0.22mm/px · 14 of 113 slices shown]
[im 1/113]
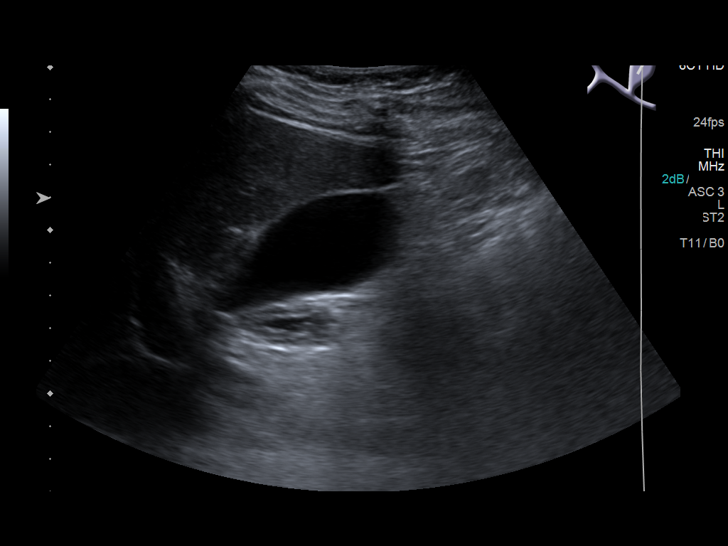
[im 10/113]
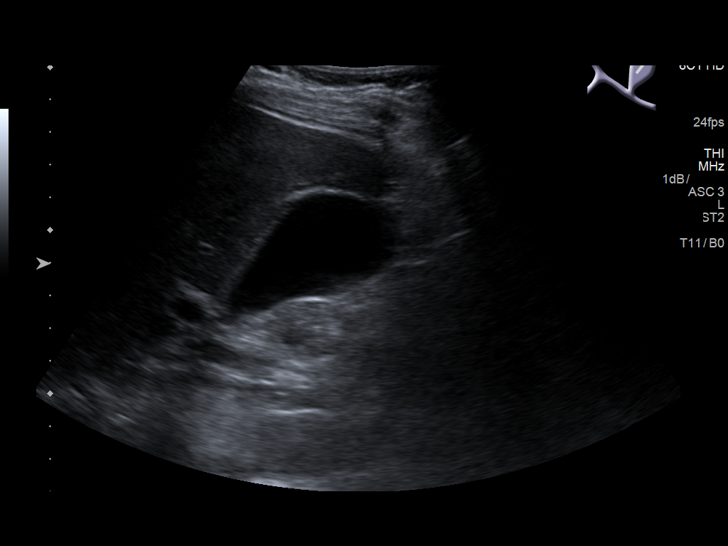
[im 19/113]
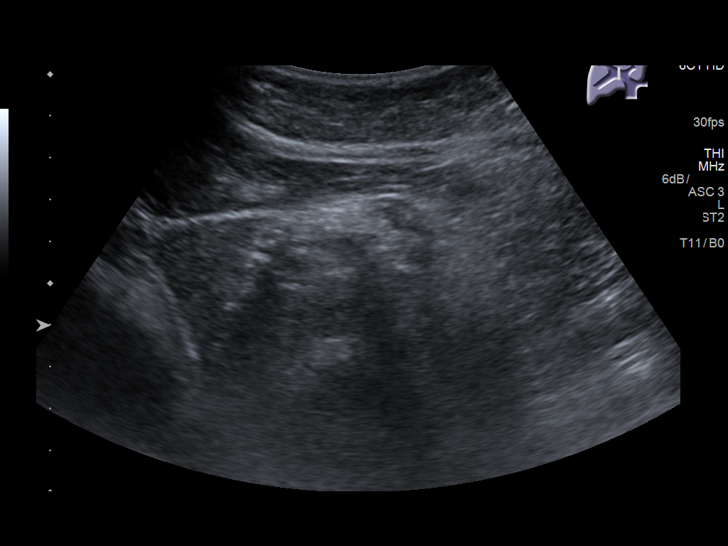
[im 29/113]
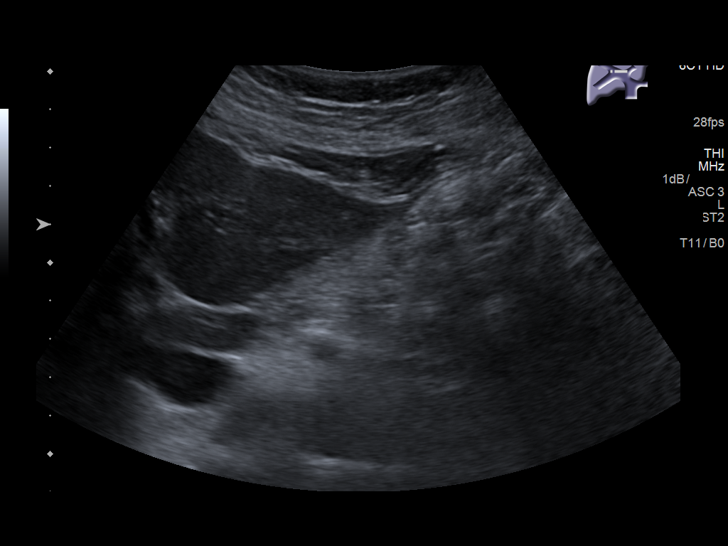
[im 38/113]
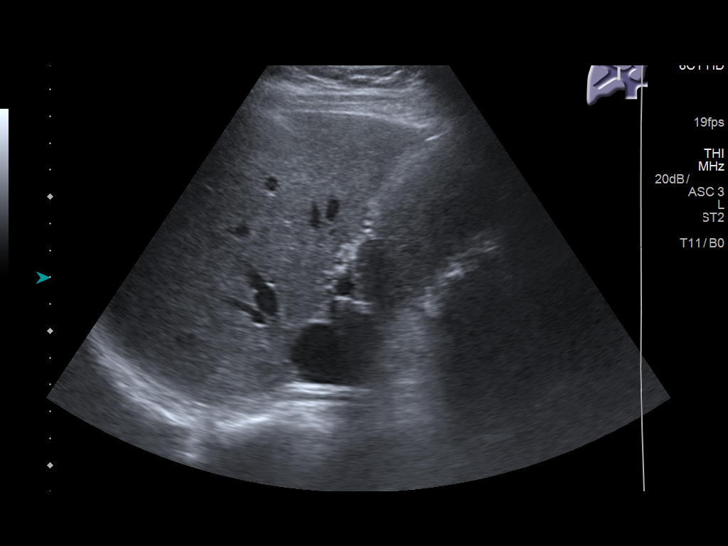
[im 43/113]
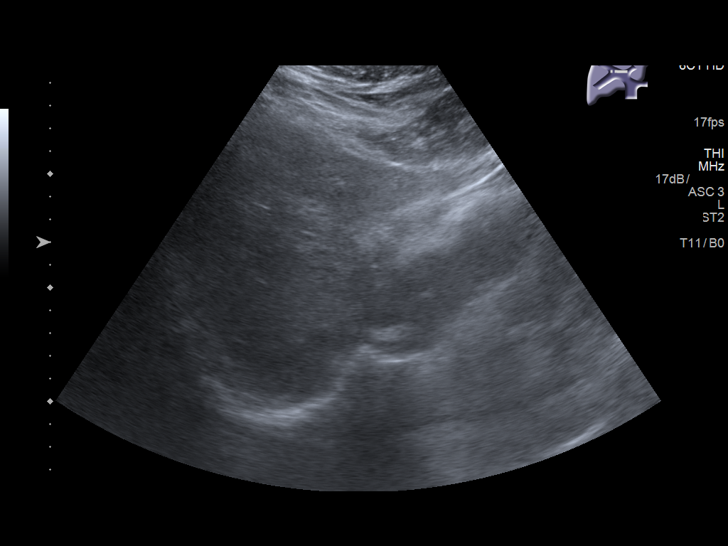
[im 52/113]
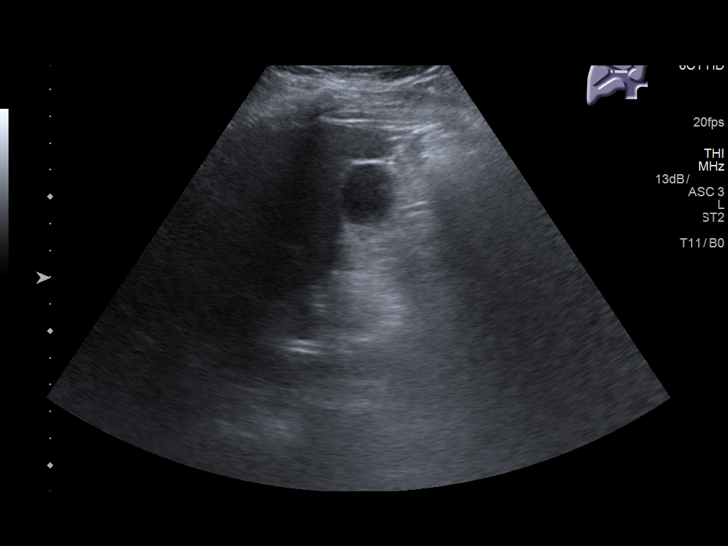
[im 61/113]
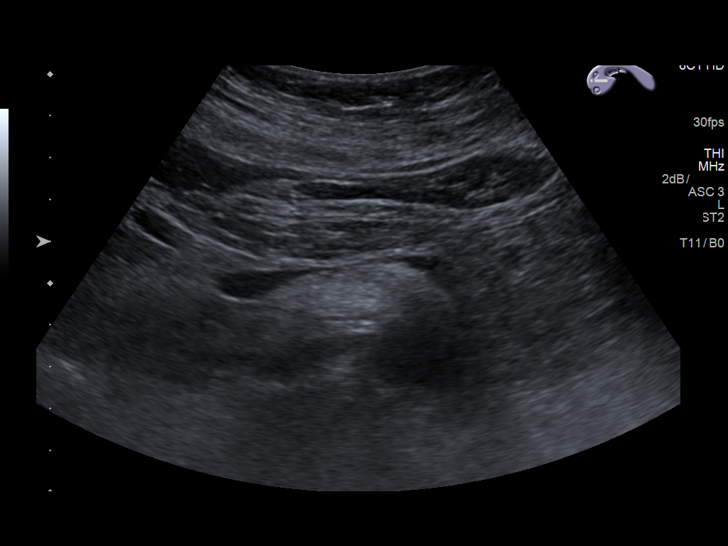
[im 71/113]
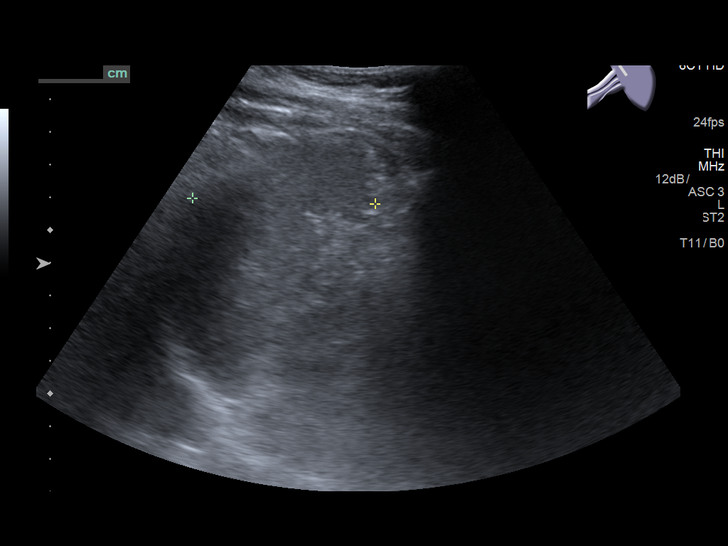
[im 75/113]
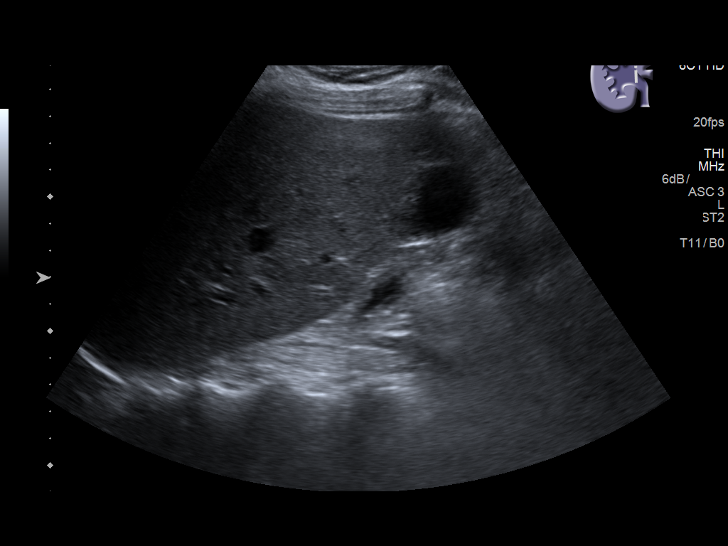
[im 85/113]
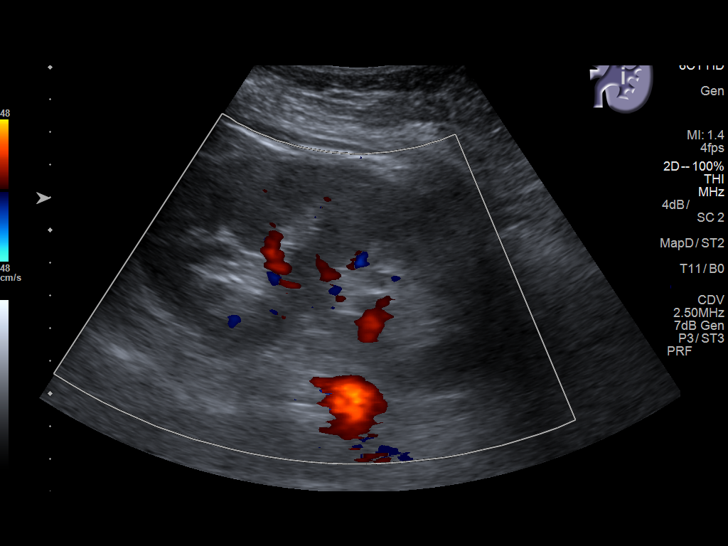
[im 94/113]
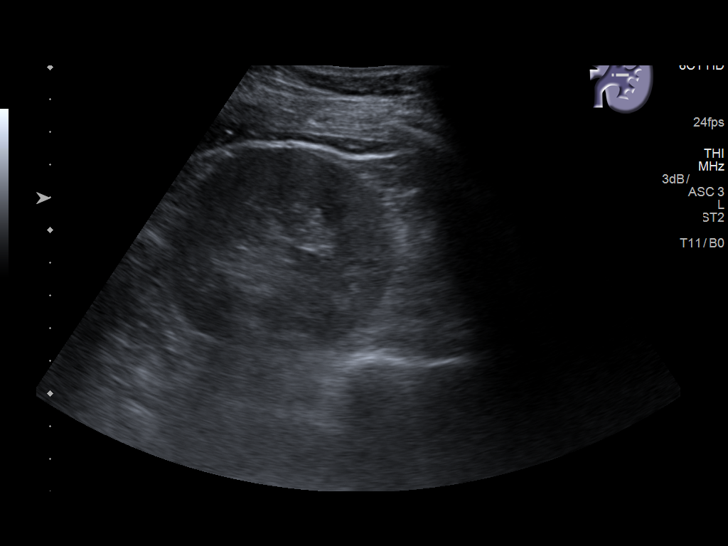
[im 103/113]
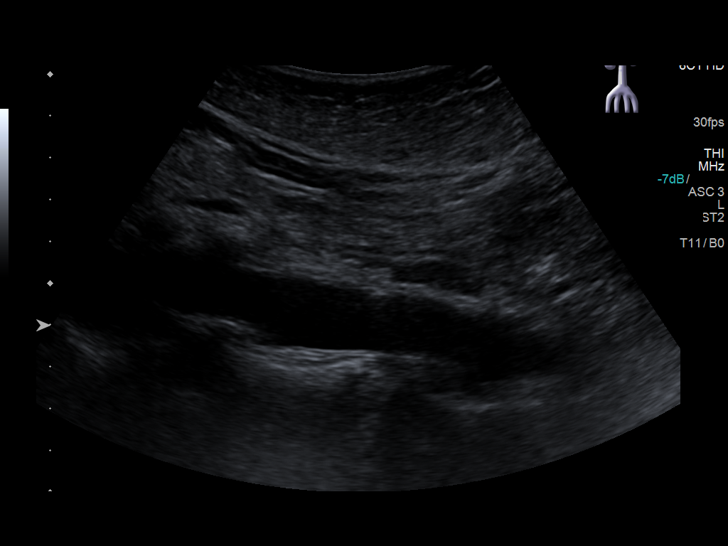
[im 113/113]
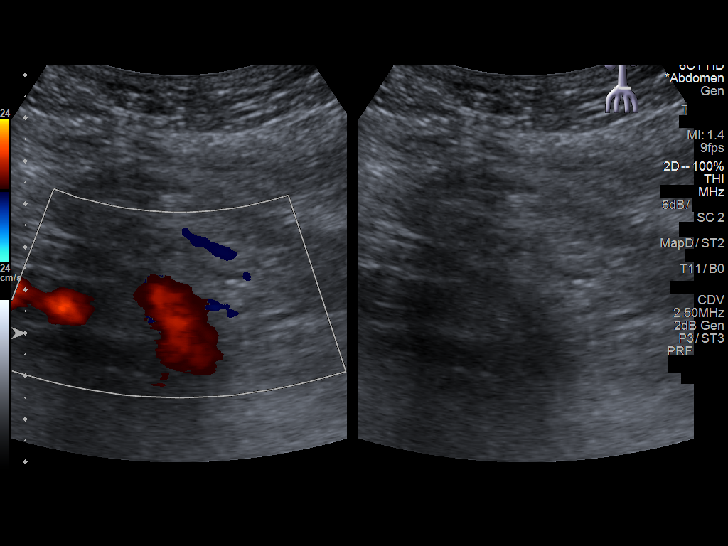

[14 of 25 positions shown; findings below may reference images not displayed]

FINDINGS: Gallbladder: No gallstones or wall thickening visualized. No
sonographic Murphy sign noted by sonographer.

Common bile duct: Diameter: 3 mm

Liver: Borderline to mildly increased parenchymal echogenicity
diffusely without focal lesion identified. Portal vein is patent on
color Doppler imaging with normal direction of blood flow towards
the liver.

IVC: No abnormality visualized.

Pancreas: Visualized portion unremarkable.

Spleen: Size and appearance within normal limits.

Right Kidney: Absent.

Left Kidney: Length: 11.6 cm. Echogenicity within normal limits. No
mass or hydronephrosis visualized.

Abdominal aorta: No aneurysm visualized.

Other findings: None.
IMPRESSION: 1. Possible mild hepatic steatosis.
2. No gallstones or biliary dilatation.
3. Prior right nephrectomy.

## 2016-06-11 ENCOUNTER — Other Ambulatory Visit: Payer: Self-pay | Admitting: Family Medicine

## 2016-06-28 DIAGNOSIS — L430 Hypertrophic lichen planus: Secondary | ICD-10-CM | POA: Diagnosis not present

## 2016-07-12 ENCOUNTER — Other Ambulatory Visit: Payer: Self-pay | Admitting: Internal Medicine

## 2016-07-12 ENCOUNTER — Other Ambulatory Visit: Payer: Self-pay | Admitting: Family Medicine

## 2016-07-12 NOTE — Telephone Encounter (Signed)
Approved:  #[id number] x 0 

## 2016-07-12 NOTE — Telephone Encounter (Signed)
Pt left v/m requesting refill [facility] to [facility]. Last refilled # 180 on 04/03/16. Last annual 07/06/15 and no future appt scheduled. Dr Deborra Medina will return to office on 07/16/16.

## 2016-07-15 MED ORDER — TRAMADOL HCL 50 MG PO TABS: ORAL_TABLET | ORAL | 0 refills | Status: DC

## 2016-07-15 NOTE — Telephone Encounter (Signed)
Rx signed and faxed to [facility]

## 2016-08-23 ENCOUNTER — Ambulatory Visit (INDEPENDENT_AMBULATORY_CARE_PROVIDER_SITE_OTHER): Payer: PPO

## 2016-08-23 VITALS — BP 110/70 | HR 63 | Temp 97.8°F | Ht 62.5 in | Wt 150.5 lb

## 2016-08-23 DIAGNOSIS — E7849 Other hyperlipidemia: Secondary | ICD-10-CM

## 2016-08-23 DIAGNOSIS — E784 Other hyperlipidemia: Secondary | ICD-10-CM | POA: Diagnosis not present

## 2016-08-23 DIAGNOSIS — Z0000 Encounter for general adult medical examination without abnormal findings: Secondary | ICD-10-CM | POA: Diagnosis not present

## 2016-08-23 DIAGNOSIS — E038 Other specified hypothyroidism: Secondary | ICD-10-CM

## 2016-08-23 DIAGNOSIS — M8580 Other specified disorders of bone density and structure, unspecified site: Secondary | ICD-10-CM

## 2016-08-23 DIAGNOSIS — I10 Essential (primary) hypertension: Secondary | ICD-10-CM | POA: Diagnosis not present

## 2016-08-23 LAB — COMPREHENSIVE METABOLIC PANEL
ALK PHOS: 67 U/L (ref 39–117)
ALT: 19 U/L (ref 0–35)
AST: 18 U/L (ref 0–37)
Albumin: 4.4 g/dL (ref 3.5–5.2)
BILIRUBIN TOTAL: 0.6 mg/dL (ref 0.2–1.2)
BUN: 12 mg/dL (ref 6–23)
CO2: 31 meq/L (ref 19–32)
Calcium: 9.7 mg/dL (ref 8.4–10.5)
Chloride: 105 mEq/L (ref 96–112)
Creatinine, Ser: 1.03 mg/dL (ref 0.40–1.20)
GFR: 56.14 mL/min — ABNORMAL LOW (ref >60.00)
GLUCOSE: 91 mg/dL (ref 70–99)
Potassium: 3.4 mEq/L — ABNORMAL LOW (ref 3.5–5.1)
Sodium: 141 mEq/L (ref 135–145)
TOTAL PROTEIN: 7.2 g/dL (ref 6.0–8.3)

## 2016-08-23 LAB — CBC WITH DIFFERENTIAL/PLATELET
BASOS ABS: 0.1 10*3/uL (ref 0.0–0.1)
Basophils Relative: 2.1 % (ref 0.0–3.0)
EOS ABS: 0.2 10*3/uL (ref 0.0–0.7)
Eosinophils Relative: 2.9 % (ref 0.0–5.0)
HCT: 35.8 % — ABNORMAL LOW (ref 36.0–46.0)
Hemoglobin: 11.4 g/dL — ABNORMAL LOW (ref 12.0–15.0)
LYMPHS ABS: 1.7 10*3/uL (ref 0.7–4.0)
Lymphocytes Relative: 26 % (ref 12.0–46.0)
MCHC: 31.8 g/dL (ref 30.0–36.0)
MCV: 85.1 fl (ref 78.0–100.0)
MONOS PCT: 9.4 % (ref 3.0–12.0)
Monocytes Absolute: 0.6 10*3/uL (ref 0.1–1.0)
NEUTROS ABS: 4 10*3/uL (ref 1.4–7.7)
NEUTROS PCT: 59.6 % (ref 43.0–77.0)
PLATELETS: 280 10*3/uL (ref 150.0–400.0)
RBC: 4.2 Mil/uL (ref 3.87–5.11)
RDW: 20.3 % — ABNORMAL HIGH (ref 11.5–15.5)
WBC: 6.7 10*3/uL (ref 4.0–10.5)

## 2016-08-23 LAB — LIPID PANEL
CHOL/HDL RATIO: 3
Cholesterol: 165 mg/dL (ref 0–200)
HDL: 65.2 mg/dL (ref >39.00)
LDL CALC: 83 mg/dL (ref 0–99)
NONHDL: 100.04
TRIGLYCERIDES: 85 mg/dL (ref 0.0–149.0)
VLDL: 17 mg/dL (ref 0.0–40.0)

## 2016-08-23 LAB — TSH: TSH: 3.04 u[IU]/mL (ref 0.35–4.50)

## 2016-08-23 LAB — T4, FREE: Free T4: 1.06 ng/dL (ref 0.60–1.60)

## 2016-08-23 LAB — VITAMIN D 25 HYDROXY (VIT D DEFICIENCY, FRACTURES): VITD: 29.78 ng/mL — AB (ref 30.00–100.00)

## 2016-08-23 NOTE — Progress Notes (Signed)
Pre visit review using our clinic review tool, if applicable. No additional management support is needed unless otherwise documented below in the visit note. 

## 2016-08-23 NOTE — Progress Notes (Signed)
PCP notes:   Health maintenance:  No gaps identified  Abnormal screenings:   Depression score: 4  Patient concerns:   None  Nurse concerns:  None  Next PCP appt:   08/28/16 @ 0915

## 2016-08-23 NOTE — Patient Instructions (Signed)
Heather Potter , Thank you for taking time to come for your Medicare Wellness Visit. I appreciate your ongoing commitment to your health goals. Please review the following plan we discussed and let me know if I can assist you in the future.   These are the goals we discussed: Goals    . Increase physical activity          Starting 08/23/2016, I will continue to walk at least 30 min 1-2 days per week.        This is a list of the screening recommended for you and due dates:  Health Maintenance  Topic Date Due  . Flu Shot  12/18/2016  . Mammogram  07/11/2017  . Tetanus Vaccine  11/27/2020  . Colon Cancer Screening  12/18/2020  . DEXA scan (bone density measurement)  Completed  .  Hepatitis C: One time screening is recommended by Center for Disease Control  (CDC) for  adults born from [birth year] through 1965.   Completed  . Pneumonia vaccines  Completed   Preventive Care for Adults  A healthy lifestyle and preventive care can promote health and wellness. Preventive health guidelines for adults include the following key practices.  . A routine yearly physical is a good way to check with your health care provider about your health and preventive screening. It is a chance to share any concerns and updates on your health and to receive a thorough exam.  . Visit your dentist for a routine exam and preventive care every 6 months. Brush your teeth twice a day and floss once a day. Good oral hygiene prevents tooth decay and gum disease.  . The frequency of eye exams is based on your age, health, family medical history, use  of contact lenses, and other factors. Follow your health care provider's ecommendations for frequency of eye exams.  . Eat a healthy diet. Foods like vegetables, fruits, whole grains, low-fat dairy products, and lean protein foods contain the nutrients you need without too many calories. Decrease your intake of foods high in solid fats, added sugars, and salt. Eat the right amount of  calories for you. Get information about a proper diet from your health care provider, if necessary.  . Regular physical exercise is one of the most important things you can do for your health. Most adults should get at least 150 minutes of moderate-intensity exercise (any activity that increases your heart rate and causes you to sweat) each week. In addition, most adults need muscle-strengthening exercises on 2 or more days a week.  Silver Sneakers may be a benefit available to you. To determine eligibility, you may visit the website: www.silversneakers.com or contact program at (681)171-1740 Mon-Fri between 8AM-8PM.   . Maintain a healthy weight. The body mass index (BMI) is a screening tool to identify possible weight problems. It provides an estimate of body fat based on height and weight. Your health care provider can find your BMI and can help you achieve or maintain a healthy weight.   For adults 20 years and older: ? A BMI below 18.5 is considered underweight. ? A BMI of 18.5 to 24.9 is normal. ? A BMI of 25 to 29.9 is considered overweight. ? A BMI of 30 and above is considered obese.   . Maintain normal blood lipids and cholesterol levels by exercising and minimizing your intake of saturated fat. Eat a balanced diet with plenty of fruit and vegetables. Blood tests for lipids and cholesterol should begin at age 43  and be repeated every 5 years. If your lipid or cholesterol levels are high, you are over 50, or you are at high risk for heart disease, you may need your cholesterol levels checked more frequently. Ongoing high lipid and cholesterol levels should be treated with medicines if diet and exercise are not working.  . If you smoke, find out from your health care provider how to quit. If you do not use tobacco, please do not start.  . If you choose to drink alcohol, please do not consume more than 2 drinks per day. One drink is considered to be 12 ounces (355 mL) of beer, 5 ounces  (148 mL) of wine, or 1.5 ounces (44 mL) of liquor.  . If you are 82-78 years old, ask your health care provider if you should take aspirin to prevent strokes.  . Use sunscreen. Apply sunscreen liberally and repeatedly throughout the day. You should seek shade when your shadow is shorter than you. Protect yourself by wearing long sleeves, pants, a wide-brimmed hat, and sunglasses year round, whenever you are outdoors.  . Once a month, do a whole body skin exam, using a mirror to look at the skin on your back. Tell your health care provider of new moles, moles that have irregular borders, moles that are larger than a pencil eraser, or moles that have changed in shape or color.

## 2016-08-23 NOTE — Progress Notes (Signed)
I reviewed health advisor's note, was available for consultation, and agree with documentation and plan.  

## 2016-08-23 NOTE — Progress Notes (Signed)
Subjective:   Heather Potter is a 71 y.o. female who presents for Medicare Annual (Subsequent) preventive examination.  Review of Systems:  N/A Cardiac Risk Factors include: advanced age (>13men, >66 women);dyslipidemia;hypertension     Objective:     Vitals: BP 110/70 (BP Location: Right Arm, Patient Position: Sitting, Cuff Size: Normal)   Pulse 63   Temp 97.8 F (36.6 C) (Oral)   Ht 5' 2.5" (1.588 m) Comment: no shoes  Wt 150 lb 8 oz (68.3 kg)   SpO2 95%   BMI 27.09 kg/m   Body mass index is 27.09 kg/m.   Tobacco History  Smoking Status  . Former Smoker  . Quit date: 05/21/2007  Smokeless Tobacco  . Never Used     Counseling given: No   Past Medical History:  Diagnosis Date  . Colitis   . History of blood transfusion    D and C and miscarriage  . History of gynecologic surgery    had right ovary removed, unsure about uterus/cervix  . Hyperlipidemia   . Hypertension   . Hypothyroidism   . Osteoarthritis   . Osteopenia    Past Surgical History:  Procedure Laterality Date  . APPENDECTOMY    . [place]   colectomy for ? Diverticulits  . DILATION AND CURETTAGE OF UTERUS    . ESOPHAGEAL MANOMETRY N/A 08/21/2015   Procedure: ESOPHAGEAL MANOMETRY (EM);  Surgeon: Mauri Pole, MD;  Location: [facility];  Service: Endoscopy;  Laterality: N/A;  . HERNIA REPAIR     right lower abd  . 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 OOPHORECTOMY     right  . OTHER SURGICAL HISTORY     part of intestine removed  . TUBAL LIGATION     bilat   Family History  Problem Relation Age of Onset  . Pancreatic cancer Mother   . Heart attack Father   . Liver cancer Brother   . Colon cancer Neg Hx   . Breast cancer Neg Hx    History  Sexual Activity  . Sexual activity: Not on file    Outpatient Encounter Prescriptions as of 08/23/2016  Medication Sig  . acetaminophen (TYLENOL) 325 MG tablet Take 325 mg by mouth every 6 (six) hours as needed.  As needed  . amLODipine (NORVASC) 10 MG tablet Take 1 tablet (10 mg total) by mouth daily. MUST SCHEDULE ANNUAL EXAM  . aspirin 81 MG tablet Take 81 mg by mouth daily.    . Aspirin-Caffeine (BAYER BACK & BODY PO) Take 2 tablets by mouth as needed.  . Calcium Carbonate-Vit D-Min (CALCIUM 1200 PO) Take 1 tablet by mouth daily.  . Clobetasol Prop Emollient Base (CLOBETASOL PROPIONATE E) 0.05 % emollient cream Apply topically as needed.  . folic acid (FOLVITE) 1 MG tablet Take 1 mg by mouth.  . hydrochlorothiazide (HYDRODIURIL) 25 MG tablet Take 1 tablet (25 mg total) by mouth daily. MUST SCHEDULE ANNUAL EXAM  . levothyroxine (SYNTHROID, LEVOTHROID) 75 MCG tablet Take 1 tablet by mouth daily  . methotrexate (RHEUMATREX) 2.5 MG tablet Take 12.5 mg by mouth once a week. Caution:Chemotherapy. Protect from light.  . simvastatin (ZOCOR) 20 MG tablet Take 0.5 tablets (10 mg total) by mouth at bedtime. MUST SCHEDULE ANNUAL EXAM  . traMADol (ULTRAM) 50 MG tablet Take one tablet by mouth two times a day   No facility-administered encounter medications on file as of 08/23/2016.     Activities of Daily  Living In your present state of health, do you have any difficulty performing the following activities: 08/23/2016  Hearing? N  Vision? N  Difficulty concentrating or making decisions? Y  Walking or climbing stairs? N  Dressing or bathing? N  Doing errands, shopping? N  Preparing Food and eating ? N  Using the Toilet? N  In the past six months, have you accidently leaked urine? N  Do you have problems with loss of bowel control? N  Managing your Medications? N  Managing your Finances? N  Housekeeping or managing your Housekeeping? N  Some recent data might be hidden    Patient Care Team: Lucille Passy, MD as PCP - General Loralie Champagne, PA-C as Physician Assistant (Gastroenterology) Mauri Pole, MD as Consulting Physician (Gastroenterology)    Assessment:     Hearing Screening   125Hz   250Hz  500Hz  1000Hz  2000Hz  3000Hz  4000Hz  6000Hz  8000Hz   Right ear:   40 40 40  40    Left ear:   40 40 40  40      Visual Acuity Screening   Right eye Left eye Both eyes  Without correction: 20/70 20/40 20/25   With correction:       Exercise Activities and Dietary recommendations Current Exercise Habits: Home exercise routine, Type of exercise: walking, Time (Minutes): 30, Frequency (Times/Week): 2, Weekly Exercise (Minutes/Week): 60, Intensity: Moderate, Exercise limited by: None identified  Goals    . Increase physical activity          Starting 08/23/2016, I will continue to walk at least 30 min 1-2 days per week.       Fall Risk Fall Risk  08/23/2016 07/06/2015 08/18/2013  Falls in the past year? No No No   Depression Screen PHQ 2/9 Scores 08/23/2016 07/06/2015 08/18/2013  PHQ - 2 Score 3 0 0  PHQ- 9 Score 4 - -     Cognitive Function MMSE - Mini Mental State Exam 08/23/2016  Orientation to time 5  Orientation to Place 5  Registration 3  Attention/ Calculation 0  Recall 3  Language- name 2 objects 0  Language- repeat 1  Language- follow 3 step command 3  Language- read & follow direction 0  Write a sentence 0  Copy design 0  Total score 20     PLEASE NOTE: A Mini-Cog screen was completed. Maximum score is 20. A value of 0 denotes this part of Folstein MMSE was not completed or the patient failed this part of the Mini-Cog screening.   Mini-Cog Screening Orientation to Time - Max 5 pts Orientation to Place - Max 5 pts Registration - Max 3 pts Recall - Max 3 pts Language Repeat - Max 1 pts Language Follow 3 Step Command - Max 3 pts     Immunization History  Administered Date(s) Administered  . Influenza Whole 03/10/2013  . Influenza,inj,Quad PF,36+ Mos 02/16/2014, 02/06/2016  . Influenza-Unspecified 03/14/2015  . Pneumococcal Conjugate-13 08/18/2013  . Pneumococcal Polysaccharide-23 11/28/2010  . Zoster 11/28/2010   Screening Tests Health Maintenance  Topic Date  Due  . INFLUENZA VACCINE  12/18/2016  . MAMMOGRAM  07/11/2017  . TETANUS/TDAP  11/27/2020  . COLONOSCOPY  12/18/2020  . DEXA SCAN  Completed  . Hepatitis C Screening  Completed  . PNA vac Low Risk Adult  Completed      Plan:     I have personally reviewed and addressed the Medicare Annual Wellness questionnaire and have noted the following in the patient's chart:  A.  Medical and social history B. Use of alcohol, tobacco or illicit drugs  C. Current medications and supplements D. Functional ability and status E.  Nutritional status F.  Physical activity G. Advance directives H. List of other physicians I.  Hospitalizations, surgeries, and ER visits in previous 12 months J.  [place] to include hearing, vision, cognitive, depression L. Referrals and appointments - none  In addition, I have reviewed and discussed with patient certain preventive protocols, quality metrics, and best practice recommendations. A written personalized care plan for preventive services as well as general preventive health recommendations were provided to patient.  See attached scanned questionnaire for additional information.   Signed,   Lindell Noe, MHA, BS, LPN Health Coach

## 2016-08-28 ENCOUNTER — Encounter: Payer: Self-pay | Admitting: Family Medicine

## 2016-08-28 ENCOUNTER — Ambulatory Visit (INDEPENDENT_AMBULATORY_CARE_PROVIDER_SITE_OTHER): Payer: PPO | Admitting: Family Medicine

## 2016-08-28 VITALS — BP 120/74 | HR 68 | Wt 149.5 lb

## 2016-08-28 DIAGNOSIS — Z01419 Encounter for gynecological examination (general) (routine) without abnormal findings: Secondary | ICD-10-CM

## 2016-08-28 DIAGNOSIS — I10 Essential (primary) hypertension: Secondary | ICD-10-CM | POA: Diagnosis not present

## 2016-08-28 DIAGNOSIS — E785 Hyperlipidemia, unspecified: Secondary | ICD-10-CM

## 2016-08-28 DIAGNOSIS — E038 Other specified hypothyroidism: Secondary | ICD-10-CM | POA: Diagnosis not present

## 2016-08-28 NOTE — Assessment & Plan Note (Signed)
Well controlled.  No changes made. 

## 2016-08-28 NOTE — Assessment & Plan Note (Signed)
Well controlled on current zocor.

## 2016-08-28 NOTE — Progress Notes (Signed)
Subjective:   Patient ID: Heather Potter, female    DOB: Oct 18, 1945, 71 y.o.   MRN: [id number]  Heather Potter is a pleasant 71 y.o. year old female who presents to clinic today with Medicare Wellness (Part 2)  on 08/28/2016  HPI:  Saw Candis Musa, RN on 08/23/16 for medicare wellness visit. Note reviewed.  Mammogram 07/12/2015 Td 11/28/10 Colonoscopy (Dr. Deatra Ina)- 12/19/10- 10 year recall Pneumovax 11/28/10 Prevnar 13 08/18/13 Zostavax 11/28/10 DEXA 07/12/15 Last pap smear 5/36/6440  On MTX, folic acid through dermatology (Dr. Lilyan Gilford). Itching has improved.  Hypothyroidism- has been on Levothyroxine 75 micrograms for years.  TSH stable.  Lab Results  Component Value Date   TSH 3.04 08/23/2016     HLD- on Simvastatin 20 mg daily and Fish oil 2000 mg daily.     Lab Results  Component Value Date   CHOL 165 08/23/2016   HDL 65.20 08/23/2016   LDLCALC 83 08/23/2016   TRIG 85.0 08/23/2016   CHOLHDL 3 08/23/2016   Lab Results  Component Value Date   ALT 19 08/23/2016   AST 18 08/23/2016   ALKPHOS 67 08/23/2016   BILITOT 0.6 08/23/2016    Current Outpatient Prescriptions on File Prior to Visit  Medication Sig Dispense Refill  . acetaminophen (TYLENOL) 325 MG tablet Take 325 mg by mouth every 6 (six) hours as needed. As needed    . amLODipine (NORVASC) 10 MG tablet Take 1 tablet (10 mg total) by mouth daily. MUST SCHEDULE ANNUAL EXAM 90 tablet 0  . aspirin 81 MG tablet Take 81 mg by mouth daily.      . Aspirin-Caffeine (BAYER BACK & BODY PO) Take 2 tablets by mouth as needed.    . Calcium Carbonate-Vit D-Min (CALCIUM 1200 PO) Take 1 tablet by mouth daily.    . Clobetasol Prop Emollient Base (CLOBETASOL PROPIONATE E) 0.05 % emollient cream Apply topically as needed.    . folic acid (FOLVITE) 1 MG tablet Take 1 mg by mouth.    . hydrochlorothiazide (HYDRODIURIL) 25 MG tablet Take 1 tablet (25 mg total) by mouth daily. MUST SCHEDULE ANNUAL EXAM 90 tablet 0  .  levothyroxine (SYNTHROID, LEVOTHROID) 75 MCG tablet Take 1 tablet by mouth daily 90 tablet 2  . methotrexate (RHEUMATREX) 2.5 MG tablet Take 12.5 mg by mouth once a week. Caution:Chemotherapy. Protect from light.    . simvastatin (ZOCOR) 20 MG tablet Take 0.5 tablets (10 mg total) by mouth at bedtime. MUST SCHEDULE ANNUAL EXAM 45 tablet 0  . traMADol (ULTRAM) 50 MG tablet Take one tablet by mouth two times a day 180 tablet 0   No current facility-administered medications on file prior to visit.     No Known Allergies  Past Medical History:  Diagnosis Date  . Colitis   . History of blood transfusion    D and C and miscarriage  . History of gynecologic surgery    had right ovary removed, unsure about uterus/cervix  . Hyperlipidemia   . Hypertension   . Hypothyroidism   . Osteoarthritis   . Osteopenia     Past Surgical History:  Procedure Laterality Date  . APPENDECTOMY    . [place]   colectomy for ? Diverticulits  . DILATION AND CURETTAGE OF UTERUS    . ESOPHAGEAL MANOMETRY N/A 08/21/2015   Procedure: ESOPHAGEAL MANOMETRY (EM);  Surgeon: Mauri Pole, MD;  Location: [facility];  Service: Endoscopy;  Laterality: N/A;  . HERNIA REPAIR  right lower abd  . 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 OOPHORECTOMY     right  . OTHER SURGICAL HISTORY     part of intestine removed  . TUBAL LIGATION     bilat    Family History  Problem Relation Age of Onset  . Pancreatic cancer Mother   . Heart attack Father   . Liver cancer Brother   . Colon cancer Neg Hx   . Breast cancer Neg Hx     Social History   Social History  . Marital status: Married    Spouse name: N/A  . Number of children: 1  . Years of education: N/A   Occupational History  . Homemaker    Social History Main Topics  . Smoking status: Former Smoker    Quit date: 05/21/2007  . Smokeless tobacco: Never Used  . Alcohol use Yes     Comment: rare- wine  . Drug use: No  .  Sexual activity: Not on file   Other Topics Concern  . Not on file   Social History Narrative   One adopted son   Regular exercise: yes      Does not have a living will or HPOA.   Desires CPR and life support for only short period if not futile.         The PMH, PSH, Social History, Family History, Medications, and allergies have been reviewed in [facility], and have been updated if relevant.   Review of Systems  Constitutional: Negative.   HENT: Negative.   Respiratory: Negative.   Cardiovascular: Negative.   Gastrointestinal: Negative.   Genitourinary: Negative.   Musculoskeletal: Negative.   Neurological: Negative.   Psychiatric/Behavioral: Negative.   All other systems reviewed and are negative.      Objective:    BP 120/74   Pulse 68   Wt 149 lb 8 oz (67.8 kg)   SpO2 99%   BMI 26.91 kg/m   Wt Readings from Last 3 Encounters:  08/28/16 149 lb 8 oz (67.8 kg)  08/23/16 150 lb 8 oz (68.3 kg)  09/13/15 146 lb (66.2 kg)    Physical Exam    General:  Well-developed,well-nourished,in no acute distress; alert,appropriate and cooperative throughout examination Head:  normocephalic and atraumatic.   Eyes:  vision grossly intact, PERRL Ears:  R ear normal and L ear normal externally, TMs clear bilaterally Nose:  no external deformity.   Mouth:  good dentition.   Neck:  No deformities, masses, or tenderness noted. Lungs:  Normal respiratory effort, chest expands symmetrically. Lungs are clear to auscultation, no crackles or wheezes. Heart:  Normal rate and regular rhythm. S1 and S2 normal without gallop, murmur, click, rub or other extra sounds. Abdomen:  Bowel sounds positive,abdomen soft and non-tender without masses, organomegaly or hernias noted. Msk:  No deformity or scoliosis noted of thoracic or lumbar spine.   Extremities:  No clubbing, cyanosis, edema, or deformity noted with normal full range of motion of all joints.   Neurologic:  alert & oriented X3 and gait  normal.   Skin:  Intact without suspicious lesions or rashes Cervical Nodes:  No lymphadenopathy noted Axillary Nodes:  No palpable lymphadenopathy Psych:  Cognition and judgment appear intact. Alert and cooperative with normal attention span and concentration. No apparent delusions, illusions, hallucinations      Assessment & Plan:   Other specified hypothyroidism  Well woman exam  Hyperlipidemia, unspecified hyperlipidemia type  Essential hypertension No Follow-up  on file.

## 2016-08-28 NOTE — Assessment & Plan Note (Signed)
Euthyroid on current dose of synthroid.

## 2016-08-28 NOTE — Assessment & Plan Note (Signed)
Reviewed preventive care protocols, scheduled due services, and updated immunizations Discussed nutrition, exercise, diet, and healthy lifestyle.  

## 2016-09-04 ENCOUNTER — Other Ambulatory Visit: Payer: Self-pay | Admitting: Family Medicine

## 2016-09-04 DIAGNOSIS — Z1231 Encounter for screening mammogram for malignant neoplasm of breast: Secondary | ICD-10-CM

## 2016-09-25 DIAGNOSIS — Z79899 Other long term (current) drug therapy: Secondary | ICD-10-CM | POA: Diagnosis not present

## 2016-09-25 DIAGNOSIS — L438 Other lichen planus: Secondary | ICD-10-CM | POA: Diagnosis not present

## 2016-09-26 ENCOUNTER — Ambulatory Visit
Admission: RE | Admit: 2016-09-26 | Discharge: 2016-09-26 | Disposition: A | Discharge status: Home / Self Care | Payer: PPO | Source: Ambulatory Visit | Attending: Family Medicine | Admitting: Family Medicine

## 2016-09-26 DIAGNOSIS — Z1231 Encounter for screening mammogram for malignant neoplasm of breast: Secondary | ICD-10-CM

## 2016-09-26 IMAGING — MG MM DIGITAL SCREENING BILAT W/ CAD
5 series · 5 of 5 positions shown · non-contrast
Comparison: Previous exam(s).

CLINICAL DATA: Screening.

EXAM:
DIGITAL SCREENING BILATERAL MAMMOGRAM WITH CAD

[R CC]
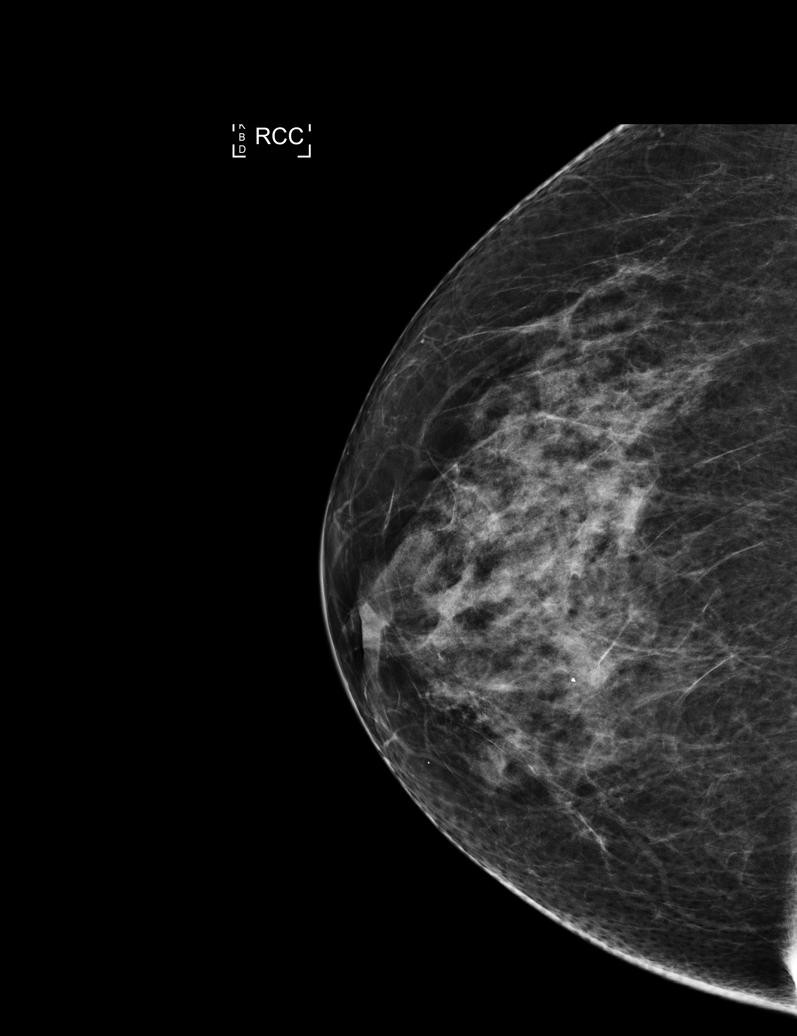

[R MLO (1 of 2)]
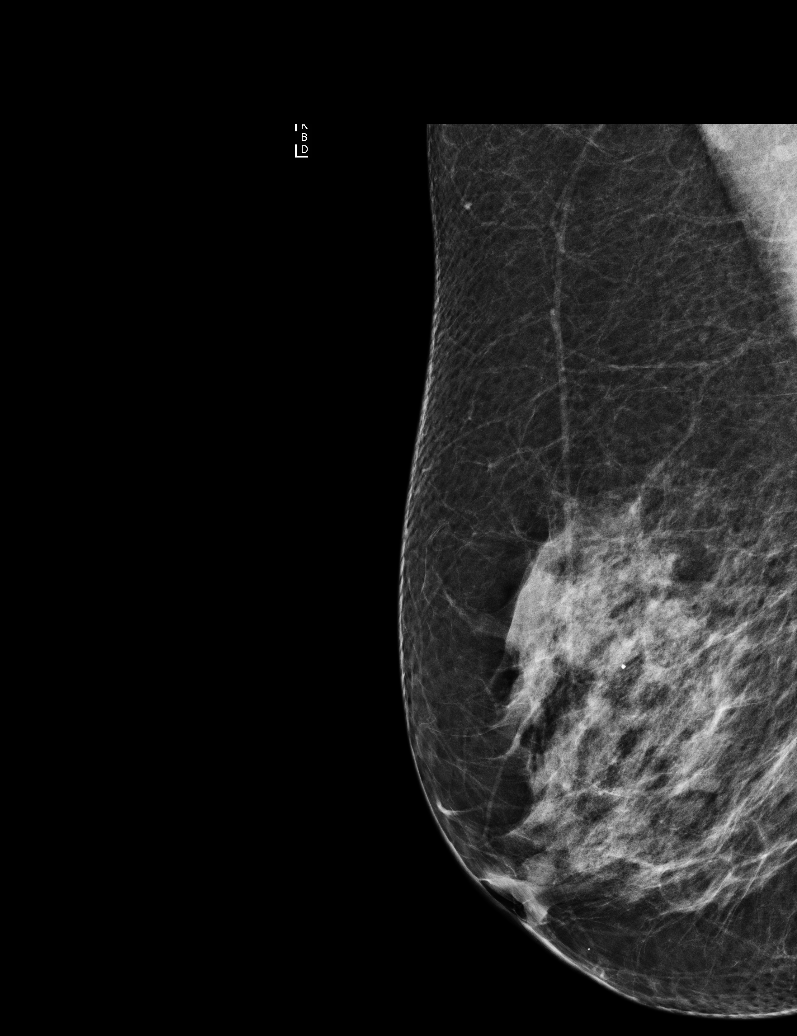

[R MLO (2 of 2)]
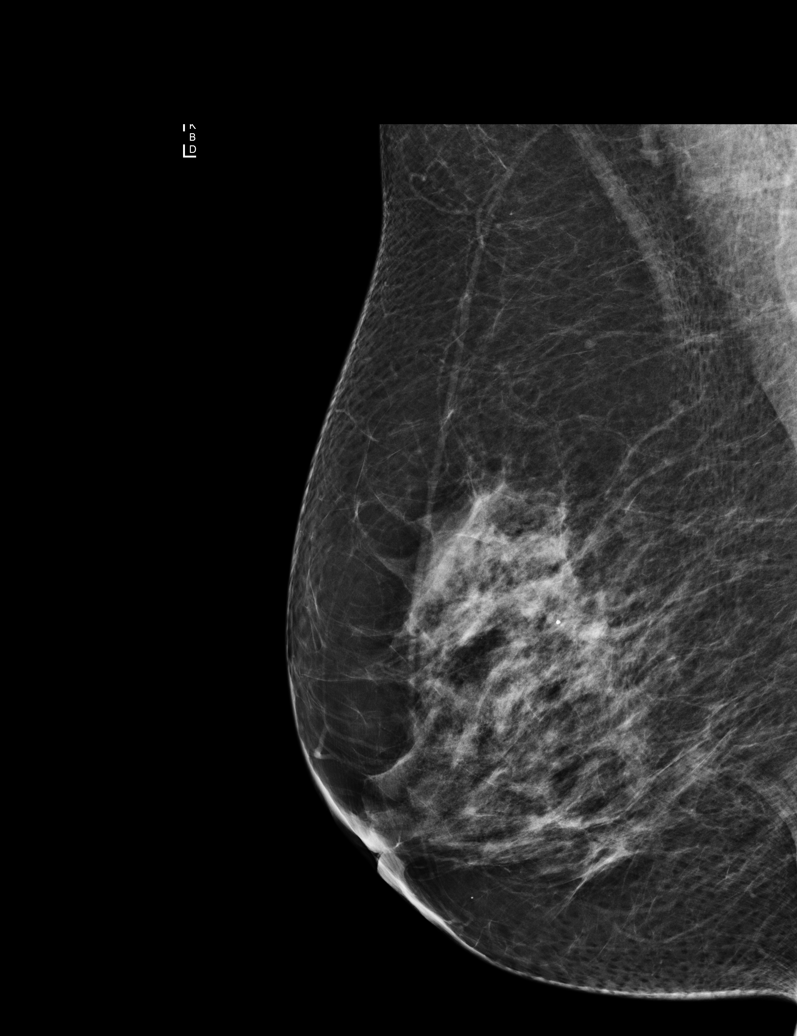

[L MLO]
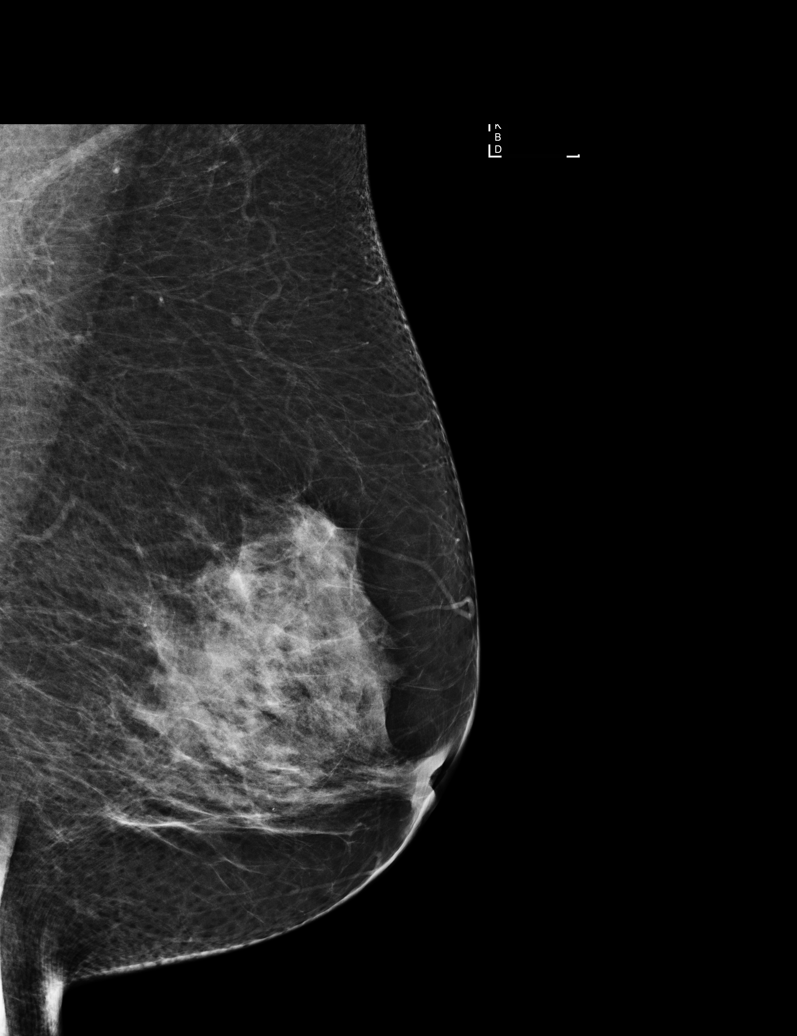

[L CC]
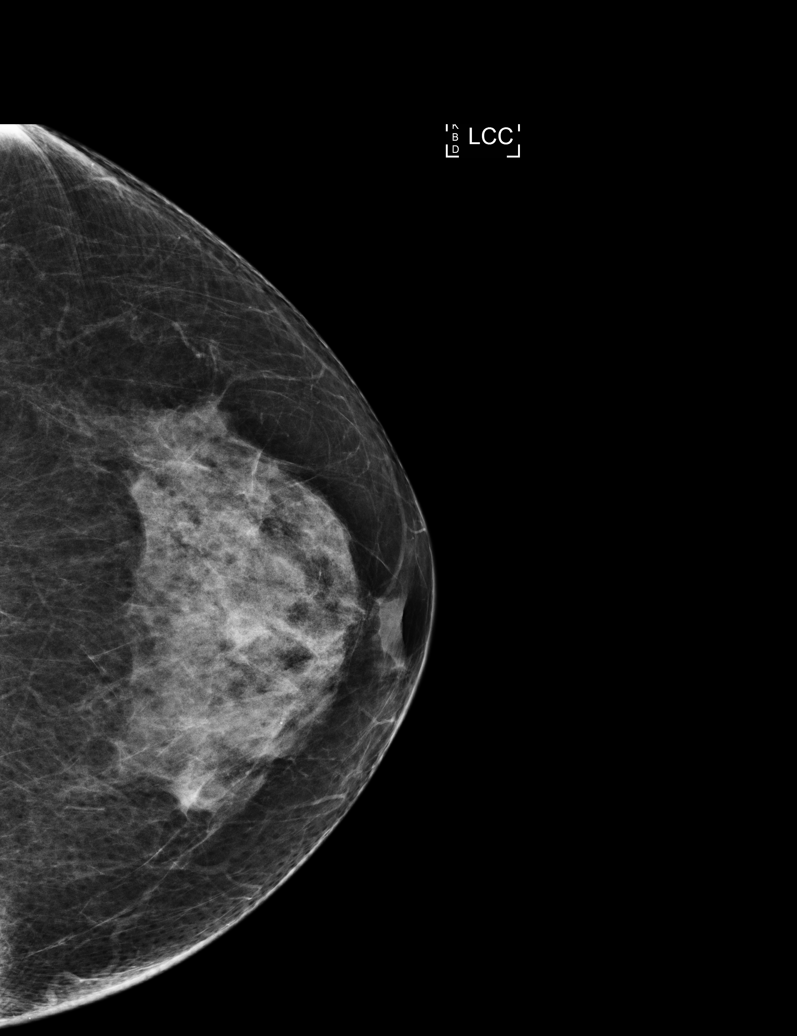

[5 of 5 positions shown; findings below may reference images not displayed]

ACR Breast Density Category c: The breast tissue is heterogeneously
dense, which may obscure small masses.
FINDINGS: There are no findings suspicious for malignancy. Images were
processed with CAD.
IMPRESSION: No mammographic evidence of malignancy. A result letter of this
screening mammogram will be mailed directly to the patient.

RECOMMENDATION:
Screening mammogram in one year. (Code:[0J])

BI-RADS CATEGORY  1: Negative.

## 2016-10-21 ENCOUNTER — Other Ambulatory Visit: Payer: Self-pay | Admitting: *Deleted

## 2016-10-21 MED ORDER — TRAMADOL HCL 50 MG PO TABS: ORAL_TABLET | ORAL | 0 refills | Status: DC

## 2016-10-21 NOTE — Telephone Encounter (Signed)
Patient left a voicemail requesting that a refill on her Tramadol be sent to [facility] order pharmacy. Last refill 07/15/16 #180 Last office visit 08/28/16

## 2016-10-21 NOTE — Telephone Encounter (Signed)
CALLED IN TO [facility] - [place], [place]: [phone]

## 2016-10-24 ENCOUNTER — Other Ambulatory Visit: Payer: Self-pay | Admitting: Family Medicine

## 2016-12-10 DIAGNOSIS — L430 Hypertrophic lichen planus: Secondary | ICD-10-CM | POA: Diagnosis not present

## 2016-12-10 DIAGNOSIS — Z5181 Encounter for therapeutic drug level monitoring: Secondary | ICD-10-CM | POA: Diagnosis not present

## 2017-01-15 DIAGNOSIS — L438 Other lichen planus: Secondary | ICD-10-CM | POA: Diagnosis not present

## 2017-01-15 DIAGNOSIS — Z79899 Other long term (current) drug therapy: Secondary | ICD-10-CM | POA: Diagnosis not present

## 2017-01-21 ENCOUNTER — Other Ambulatory Visit: Payer: Self-pay

## 2017-01-21 MED ORDER — TRAMADOL HCL 50 MG PO TABS: ORAL_TABLET | ORAL | 0 refills | Status: DC

## 2017-01-21 NOTE — Telephone Encounter (Signed)
Called in to [place], [place]: [phone]

## 2017-01-21 NOTE — Telephone Encounter (Signed)
Pt left v/m requesting rx [facility]. Last refilled # 180 on 10/21/16. Last seen 08/28/16.

## 2017-01-22 DIAGNOSIS — L089 Local infection of the skin and subcutaneous tissue, unspecified: Secondary | ICD-10-CM | POA: Diagnosis not present

## 2017-01-22 DIAGNOSIS — S61452A Open bite of left hand, initial encounter: Secondary | ICD-10-CM | POA: Diagnosis not present

## 2017-01-24 ENCOUNTER — Other Ambulatory Visit: Payer: Self-pay | Admitting: Family Medicine

## 2017-02-11 DIAGNOSIS — Z5181 Encounter for therapeutic drug level monitoring: Secondary | ICD-10-CM | POA: Diagnosis not present

## 2017-02-11 DIAGNOSIS — L438 Other lichen planus: Secondary | ICD-10-CM | POA: Diagnosis not present

## 2017-04-02 ENCOUNTER — Other Ambulatory Visit: Payer: Self-pay | Admitting: Internal Medicine

## 2017-04-02 MED ORDER — TRAMADOL HCL 50 MG PO TABS: ORAL_TABLET | ORAL | 0 refills | Status: DC

## 2017-04-02 NOTE — Telephone Encounter (Signed)
Copied from [place]. Topic: Inquiry >> Apr 02, 2017  8:44 AM Pricilla Handler wrote: Reason for CRM: Patient called wanting a refill of Tramadol asap. Patient wants a call back today. [facility] [address] [place] - [place] [phone] (Phone) [phone] (Fax). Please call patient.Marland KitchenMarland Kitchen

## 2017-04-02 NOTE — Telephone Encounter (Signed)
Called in to pharm/thx dmf

## 2017-04-02 NOTE — Telephone Encounter (Signed)
Pt requesting refill Tramadol to [facility] [address]; last refilled # 180 on 01/21/17;pt last seen annual by Dr Deborra Medina on 08/28/16. No UDS found.Please advise.

## 2017-04-07 NOTE — Telephone Encounter (Signed)
Pt said [facility] [address] did not get tramadol rx. I spoke with Melissa at [facility] [address] and they did get call in of tramadol but too early to fill; pt got 90 day supply on 01/27/17 from [facility]. Pt voiced understanding.

## 2017-04-23 DIAGNOSIS — L438 Other lichen planus: Secondary | ICD-10-CM | POA: Diagnosis not present

## 2017-04-23 DIAGNOSIS — L538 Other specified erythematous conditions: Secondary | ICD-10-CM | POA: Diagnosis not present

## 2017-04-23 DIAGNOSIS — B078 Other viral warts: Secondary | ICD-10-CM | POA: Diagnosis not present

## 2017-04-28 ENCOUNTER — Ambulatory Visit: Payer: PPO | Admitting: Family Medicine

## 2017-05-25 DIAGNOSIS — J019 Acute sinusitis, unspecified: Secondary | ICD-10-CM | POA: Diagnosis not present

## 2017-05-28 ENCOUNTER — Ambulatory Visit: Payer: PPO | Admitting: Family Medicine

## 2017-06-05 ENCOUNTER — Other Ambulatory Visit: Payer: Self-pay | Admitting: Family Medicine

## 2017-06-11 ENCOUNTER — Telehealth: Payer: Self-pay | Admitting: Family Medicine

## 2017-06-11 MED ORDER — LEVOTHYROXINE SODIUM 75 MCG PO TABS: 75.0000 ug | ORAL_TABLET | Freq: Every day | ORAL | 0 refills | Status: DC

## 2017-06-11 NOTE — Telephone Encounter (Signed)
Rx sent in for 10 days to hold patient over until her mail order comes in.

## 2017-06-11 NOTE — Telephone Encounter (Signed)
Copied from [place]. Topic: Quick Communication - Rx Refill/Question >> Jun 11, 2017 11:49 AM Percell Belt A wrote: Medication: levothyroxine (SYNTHROID, LEVOTHROID) 75 MCG tablet [[id number]]   Has the patient contacted their pharmacy?    (Agent: If no, request that the patient contact the pharmacy for the refill.)   Preferred Pharmacy (with phone number or street name): pt is needing a 5 day supply sent to her local pharmacy.  She has not gotten her mail order yet and has not yet est with Carle[facility] Purl    Agent: Please be advised that RX refills may take up to 3 business days. We ask that you follow-up with your pharmacy.

## 2017-06-27 ENCOUNTER — Encounter: Payer: Self-pay | Admitting: Family Medicine

## 2017-06-27 ENCOUNTER — Ambulatory Visit (INDEPENDENT_AMBULATORY_CARE_PROVIDER_SITE_OTHER): Payer: PPO | Admitting: Family Medicine

## 2017-06-27 VITALS — BP 128/70 | HR 67 | Temp 98.4°F | Ht 62.5 in | Wt 146.5 lb

## 2017-06-27 DIAGNOSIS — M19019 Primary osteoarthritis, unspecified shoulder: Secondary | ICD-10-CM | POA: Diagnosis not present

## 2017-06-27 DIAGNOSIS — I10 Essential (primary) hypertension: Secondary | ICD-10-CM

## 2017-06-27 DIAGNOSIS — M25552 Pain in left hip: Secondary | ICD-10-CM

## 2017-06-27 DIAGNOSIS — Z7689 Persons encountering health services in other specified circumstances: Secondary | ICD-10-CM | POA: Diagnosis not present

## 2017-06-27 MED ORDER — TRAMADOL HCL 50 MG PO TABS: ORAL_TABLET | ORAL | 0 refills | Status: DC

## 2017-06-27 NOTE — Patient Instructions (Addendum)
For arthritis pain you can try Extra Strength Tylenol- 2 tablets every 8-12 hours  Look at [facility] for home remedies  Can also add- Turmeric, Dark Cherry Extract, Ginger    Please follow up in 6 months   Hip Exercises Ask your health care provider which exercises are safe for you. Do exercises exactly as told by your health care provider and adjust them as directed. It is normal to feel mild stretching, pulling, tightness, or discomfort as you do these exercises, but you should stop right away if you feel sudden pain or your pain gets worse.Do not begin these exercises until told by your health care provider. STRETCHING AND RANGE OF MOTION EXERCISES These exercises warm up your muscles and joints and improve the movement and flexibility of your hip. These exercises also help to relieve pain, numbness, and tingling. Exercise A: Hamstrings, Supine  1. Lie on your back. 2. Loop a belt or towel over the ball of your left / rightfoot. The ball of your foot is on the walking surface, right under your toes. 3. Straighten your left / rightknee and slowly pull on the belt to raise your leg. ? Do not let your left / right knee bend while you do this. ? Keep your other leg flat on the floor. ? Raise the left / right leg until you feel a gentle stretch behind your left / right knee or thigh. 4. Hold this position for __________ seconds. 5. Slowly return your leg to the starting position. Repeat __________ times. Complete this stretch __________ times a day. Exercise B: Hip Rotators  1. Lie on your back on a firm surface. 2. Hold your left / right knee with your left / right hand. Hold your ankle with your other hand. 3. Gently pull your left / right knee and rotate your lower leg toward your other shoulder. ? Pull until you feel a stretch in your buttocks. ? Keep your hips and shoulders firmly planted while you do this stretch. 4. Hold this position for __________  seconds. Repeat __________ times. Complete this stretch __________ times a day. Exercise C: V-Sit (Hamstrings and Adductors)  1. Sit on the floor with your legs extended in a large "V" shape. Keep your knees straight during this exercise. 2. Start with your head and chest upright, then bend at your waist to reach for your left foot (position A). You should feel a stretch in your right inner thigh. 3. Hold this position for __________ seconds. Then slowly return to the upright position. 4. Bend at your waist to reach forward (position B). You should feel a stretch behind both of your thighs and knees. 5. Hold this position for __________ seconds. Then slowly return to the upright position. 6. Bend at your waist to reach for your right foot (position C). You should feel a stretch in your left inner thigh. 7. Hold this position for __________ seconds. Then slowly return to the upright position. Repeat __________ times. Complete this stretch __________ times a day. Exercise D: Lunge (Hip Flexors)  1. Place your left / right knee on the floor and bend your other knee so that is directly over your ankle. You should be half-kneeling. 2. Keep good posture with your head over your shoulders. 3. Tighten your buttocks to point your tailbone downward. This helps your back to keep from arching too much. 4. You should feel a gentle stretch in the front of your left / right thigh and hip. If you  do not feel any resistance, slightly slide your other foot forward and then slowly lunge forward so your knee once again lines up over your ankle. 5. Make sure your tailbone continues to point downward. 6. Hold this position for __________ seconds. Repeat __________ times. Complete this stretch __________ times a day. STRENGTHENING EXERCISES These exercises build strength and endurance in your hip. Endurance is the ability to use your muscles for a long time, even after they get tired. Exercise E: Bridge (Hip  Extensors)  1. Lie on your back on a firm surface with your knees bent and your feet flat on the floor. 2. Tighten your buttocks muscles and lift your bottom off the floor until the trunk of your body is level with your thighs. ? Do not arch your back. ? You should feel the muscles working in your buttocks and the back of your thighs. If you do not feel these muscles, slide your feet 1-2 inches (2.5-5 cm) farther away from your buttocks. 3. Hold this position for __________ seconds. 4. Slowly lower your hips to the starting position. 5. Let your muscles relax completely between repetitions. 6. If this exercise is too easy, try doing it with your arms crossed over your chest. Repeat __________ times. Complete this exercise __________ times a day. Exercise F: Straight Leg Raises - Hip Abductors  1. Lie on your side with your left / right leg in the top position. Lie so your head, shoulder, knee, and hip line up with each other. You may bend your bottom knee to help you balance. 2. Roll your hips slightly forward, so your hips are stacked directly over each other and your left / right knee is facing forward. 3. Leading with your heel, lift your top leg 4-6 inches (10-15 cm). You should feel the muscles in your outer hip lifting. ? Do not let your foot drift forward. ? Do not let your knee roll toward the ceiling. 4. Hold this position for __________ seconds. 5. Slowly return to the starting position. 6. Let your muscles relax completely between repetitions. Repeat __________ times. Complete this exercise __________ times a day. Exercise G: Straight Leg Raises - Hip Adductors  1. Lie on your side with your left / right leg in the bottom position. Lie so your head, shoulder, knee, and hip line up. You may place your upper foot in front to help you balance. 2. Roll your hips slightly forward, so your hips are stacked directly over each other and your left / right knee is facing forward. 3. Tense  the muscles in your inner thigh and lift your bottom leg 4-6 inches (10-15 cm). 4. Hold this position for __________ seconds. 5. Slowly return to the starting position. 6. Let your muscles relax completely between repetitions. Repeat __________ times. Complete this exercise __________ times a day. Exercise H: Straight Leg Raises - Quadriceps  1. Lie on your back with your left / right leg extended and your other knee bent. 2. Tense the muscles in the front of your left / right thigh. When you do this, you should see your kneecap slide up or see increased dimpling just above your knee. 3. Tighten these muscles even more and raise your leg 4-6 inches (10-15 cm) off the floor. 4. Hold this position for __________ seconds. 5. Keep these muscles tense as you lower your leg. 6. Relax the muscles slowly and completely between repetitions. Repeat __________ times. Complete this exercise __________ times a day. Exercise I: Hip Abductors,  Standing 1. Tie one end of a rubber exercise band or tubing to a secure surface, such as a table or pole. 2. Loop the other end of the band or tubing around your left / right ankle. 3. Keeping your ankle with the band or tubing directly opposite of the secured end, step away until there is tension in the tubing or band. Hold onto a chair as needed for balance. 4. Lift your left / right leg out to your side. While you do this: ? Keep your back upright. ? Keep your shoulders over your hips. ? Keep your toes pointing forward. ? Make sure to use your hip muscles to lift your leg. Do not "throw" your leg or tip your body to lift your leg. 5. Hold this position for __________ seconds. 6. Slowly return to the starting position. Repeat __________ times. Complete this exercise __________ times a day. Exercise J: Squats (Quadriceps) 1. Stand in a door frame so your feet and knees are in line with the frame. You may place your hands on the frame for balance. 2. Slowly bend  your knees and lower your hips like you are going to sit in a chair. ? Keep your lower legs in a straight-up-and-down position. ? Do not let your hips go lower than your knees. ? Do not bend your knees lower than told by your health care provider. ? If your hip pain increases, do not bend as low. 3. Hold this position for ___________ seconds. 4. Slowly push with your legs to return to standing. Do not use your hands to pull yourself to standing. Repeat __________ times. Complete this exercise __________ times a day. This information is not intended to replace advice given to you by your health care provider. Make sure you discuss any questions you have with your health care provider. Document Released: 05/24/2005 Document Revised: 01/29/2016 Document Reviewed: 05/01/2015 Elsevier Interactive Patient Education  [employer].

## 2017-06-27 NOTE — Progress Notes (Signed)
Subjective:    Patient ID: Heather Potter, female    DOB: [birth year], 72 y.o.   MRN: [id number]   HPI This is a 72 yo female who presents today to establish care, previous Deborra Medina patient. Married and takes care of her 72 yo MIL who lives with her. Has a son, DIL and grandbaby. She visits them some.   Is taking methotrexate for ? Skin condition, is weaning.  Sees dermatologist regularly who ordered labs.  She will have these faxed to this office.  Osteoarthritis-she has chronic arthritis of her hands and shoulders.  She takes tramadol twice a day with moderate improvement.  She is tried to wean off tramadol but was unable to.  Left hip pain and right knee pain. 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 Hip pain off and on, feels "loose." No am stiffness. No exacerbating or alleviating symptoms.   Last CPE- 08/28/16 Mammo- 09/26/16 Pap- hysterectomy Colonoscopy- 6/99 Tdap- unknown Flu- annual Exercise- walks dogs, gardens when weather is good  No chest pain, no SOB, no recent illness.   Past Medical History:  Diagnosis Date  . Colitis   . History of blood transfusion    D and C and miscarriage  . History of gynecologic surgery    had right ovary removed, unsure about uterus/cervix  . Hyperlipidemia   . Hypertension   . Hypothyroidism   . Osteoarthritis   . Osteopenia    Past Surgical History:  Procedure Laterality Date  . APPENDECTOMY    . [place]   colectomy for ? Diverticulits  . DILATION AND CURETTAGE OF UTERUS    . ESOPHAGEAL MANOMETRY N/A 08/21/2015   Procedure: ESOPHAGEAL MANOMETRY (EM);  Surgeon: Mauri Pole, MD;  Location: [facility];  Service: Endoscopy;  Laterality: N/A;  . HERNIA REPAIR     right lower abd  . 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 OOPHORECTOMY     right  . OTHER SURGICAL HISTORY     part of intestine removed  . TUBAL LIGATION     bilat   Family History  Problem Relation Age of Onset  . Pancreatic  cancer Mother   . Heart attack Father   . Liver cancer Brother   . Colon cancer Neg Hx   . Breast cancer Neg Hx    Social History   Tobacco Use  . Smoking status: Former Smoker    Last attempt to quit: 05/21/2007    Years since quitting: 10.1  . Smokeless tobacco: Never Used  Substance Use Topics  . Alcohol use: Yes    Comment: rare- wine  . Drug use: No      Review of Systems Per HPI    Objective:   Physical Exam  Constitutional: She is oriented to person, place, and time. She appears well-developed and well-nourished. No distress.  HENT:  Head: Normocephalic and atraumatic.  Eyes: Conjunctivae are normal.  Cardiovascular: Normal rate, regular rhythm and normal heart sounds.  Pulmonary/Chest: Breath sounds normal.  Musculoskeletal:       Left hip: She exhibits normal range of motion, normal strength, no tenderness and no bony tenderness.  Mild pain with abduction.   Neurological: She is alert and oriented to person, place, and time.  Skin: Skin is warm and dry. She is not diaphoretic.  Psychiatric: She has a normal mood and affect. Her behavior is normal. Judgment and thought content normal.  Vitals reviewed.  BP 128/70   Pulse 67   Temp 98.4 F (36.9 C) (Oral)   Ht 5' 2.5" (1.588 m)   Wt 146 lb 8 oz (66.5 kg)   SpO2 97%   BMI 26.37 kg/m  Wt Readings from Last 3 Encounters:  06/27/17 146 lb 8 oz (66.5 kg)  08/28/16 149 lb 8 oz (67.8 kg)  08/23/16 150 lb 8 oz (68.3 kg)       Assessment & Plan:  1. Encounter to establish care - Discussed and encouraged healthy lifestyle choices- adequate sleep, regular exercise, stress management and healthy food choices.   2. Essential hypertension - well controlled   3. Left hip pain - continue to avoid NSAIDs, can take Extra strength Tylenol and discussed over-the-counter supplements such as turmeric, tart cherry extract, Ginger provided her written instructions for hip exercises -Follow-up in 2-3 months for  complete physical exam and will follow-up on hip pain   Clarene Reamer, FNP-BC  [facility]  06/27/2017 1:50 PM

## 2017-07-04 ENCOUNTER — Encounter: Payer: Self-pay | Admitting: Family Medicine

## 2017-07-04 DIAGNOSIS — L439 Lichen planus, unspecified: Secondary | ICD-10-CM | POA: Insufficient documentation

## 2017-07-04 HISTORY — DX: Lichen planus, unspecified: L43.9

## 2017-07-10 ENCOUNTER — Telehealth: Payer: Self-pay | Admitting: Family Medicine

## 2017-07-10 NOTE — Telephone Encounter (Signed)
Pt dropped off lab results from [place], per [place] request. Placed in Rx tower.

## 2017-07-14 NOTE — Telephone Encounter (Signed)
[employer] order, not results, was in my box. Please call patient and ask her if she had her labs drawn or does she wish to have them drawn here? I can put in orders if that is the case. The requisition is in my in box.

## 2017-07-15 NOTE — Telephone Encounter (Signed)
Lm on pts vm and requested a call back to confirm if form she brought to office are supposed to be lab orders or results. If results, she dropped off the incorrect form. If orders, pls advise pt Heather Potter will complete as listed below.

## 2017-07-22 NOTE — Telephone Encounter (Signed)
Pt stated this was a lab order and wanted to know if you would order so she can get this done here at office

## 2017-07-23 ENCOUNTER — Other Ambulatory Visit: Payer: Self-pay | Admitting: Family Medicine

## 2017-07-23 DIAGNOSIS — Z79899 Other long term (current) drug therapy: Secondary | ICD-10-CM

## 2017-07-23 NOTE — Progress Notes (Signed)
bc

## 2017-07-23 NOTE — Telephone Encounter (Signed)
Spoke to pt and scheduled future appt for labs

## 2017-07-23 NOTE — Telephone Encounter (Signed)
I have placed future lab orders. Please call the patient and schedule lab only visit.

## 2017-07-24 ENCOUNTER — Other Ambulatory Visit (INDEPENDENT_AMBULATORY_CARE_PROVIDER_SITE_OTHER): Payer: PPO

## 2017-07-24 DIAGNOSIS — Z79899 Other long term (current) drug therapy: Secondary | ICD-10-CM

## 2017-07-24 LAB — CBC WITH DIFFERENTIAL/PLATELET
Basophils Absolute: 0.2 10*3/uL — ABNORMAL HIGH (ref 0.0–0.1)
Basophils Relative: 2.4 % (ref 0.0–3.0)
Eosinophils Absolute: 0.3 10*3/uL (ref 0.0–0.7)
Eosinophils Relative: 4 % (ref 0.0–5.0)
HCT: 35.9 % — ABNORMAL LOW (ref 36.0–46.0)
Hemoglobin: 11.2 g/dL — ABNORMAL LOW (ref 12.0–15.0)
LYMPHS ABS: 1.5 10*3/uL (ref 0.7–4.0)
Lymphocytes Relative: 20.9 % (ref 12.0–46.0)
MCHC: 31.2 g/dL (ref 30.0–36.0)
MCV: 82.8 fl (ref 78.0–100.0)
MONO ABS: 0.7 10*3/uL (ref 0.1–1.0)
Monocytes Relative: 10.6 % (ref 3.0–12.0)
NEUTROS PCT: 62.1 % (ref 43.0–77.0)
Neutro Abs: 4.3 10*3/uL (ref 1.4–7.7)
RBC: 4.33 Mil/uL (ref 3.87–5.11)
RDW: 20.2 % — ABNORMAL HIGH (ref 11.5–15.5)
WBC: 7 10*3/uL (ref 4.0–10.5)

## 2017-07-24 LAB — COMPREHENSIVE METABOLIC PANEL
ALK PHOS: 85 U/L (ref 39–117)
ALT: 19 U/L (ref 0–35)
AST: 24 U/L (ref 0–37)
Albumin: 4.2 g/dL (ref 3.5–5.2)
BILIRUBIN TOTAL: 0.5 mg/dL (ref 0.2–1.2)
BUN: 10 mg/dL (ref 6–23)
CO2: 29 mEq/L (ref 19–32)
Calcium: 10.1 mg/dL (ref 8.4–10.5)
Chloride: 104 mEq/L (ref 96–112)
Creatinine, Ser: 0.94 mg/dL (ref 0.40–1.20)
GFR: 62.22 mL/min (ref >60.00)
GLUCOSE: 95 mg/dL (ref 70–99)
Potassium: 4.1 mEq/L (ref 3.5–5.1)
Sodium: 140 mEq/L (ref 135–145)
TOTAL PROTEIN: 7.7 g/dL (ref 6.0–8.3)

## 2017-08-25 ENCOUNTER — Other Ambulatory Visit: Payer: Self-pay | Admitting: Family Medicine

## 2017-08-25 DIAGNOSIS — E038 Other specified hypothyroidism: Secondary | ICD-10-CM

## 2017-08-25 DIAGNOSIS — M8580 Other specified disorders of bone density and structure, unspecified site: Secondary | ICD-10-CM

## 2017-08-25 DIAGNOSIS — Z79899 Other long term (current) drug therapy: Secondary | ICD-10-CM

## 2017-08-25 DIAGNOSIS — I10 Essential (primary) hypertension: Secondary | ICD-10-CM

## 2017-08-26 ENCOUNTER — Ambulatory Visit (INDEPENDENT_AMBULATORY_CARE_PROVIDER_SITE_OTHER): Payer: PPO

## 2017-08-26 ENCOUNTER — Ambulatory Visit: Payer: PPO

## 2017-08-26 ENCOUNTER — Other Ambulatory Visit: Payer: Self-pay | Admitting: Family Medicine

## 2017-08-26 VITALS — BP 110/70 | HR 64 | Temp 97.8°F | Ht 62.5 in | Wt 147.5 lb

## 2017-08-26 DIAGNOSIS — E038 Other specified hypothyroidism: Secondary | ICD-10-CM

## 2017-08-26 DIAGNOSIS — Z1231 Encounter for screening mammogram for malignant neoplasm of breast: Secondary | ICD-10-CM

## 2017-08-26 DIAGNOSIS — Z79899 Other long term (current) drug therapy: Secondary | ICD-10-CM | POA: Diagnosis not present

## 2017-08-26 DIAGNOSIS — Z0000 Encounter for general adult medical examination without abnormal findings: Secondary | ICD-10-CM | POA: Diagnosis not present

## 2017-08-26 DIAGNOSIS — I10 Essential (primary) hypertension: Secondary | ICD-10-CM | POA: Diagnosis not present

## 2017-08-26 DIAGNOSIS — M8580 Other specified disorders of bone density and structure, unspecified site: Secondary | ICD-10-CM | POA: Diagnosis not present

## 2017-08-26 LAB — LIPID PANEL
CHOLESTEROL: 178 mg/dL (ref 0–200)
HDL: 69.6 mg/dL (ref >39.00)
LDL Cholesterol: 88 mg/dL (ref 0–99)
NonHDL: 107.96
TRIGLYCERIDES: 101 mg/dL (ref 0.0–149.0)
Total CHOL/HDL Ratio: 3
VLDL: 20.2 mg/dL (ref 0.0–40.0)

## 2017-08-26 LAB — CBC WITH DIFFERENTIAL/PLATELET
BASOS PCT: 1.9 % (ref 0.0–3.0)
Basophils Absolute: 0.1 10*3/uL (ref 0.0–0.1)
EOS ABS: 0.2 10*3/uL (ref 0.0–0.7)
Eosinophils Relative: 2.8 % (ref 0.0–5.0)
HEMATOCRIT: 34.9 % — AB (ref 36.0–46.0)
HEMOGLOBIN: 11.2 g/dL — AB (ref 12.0–15.0)
LYMPHS PCT: 26.2 % (ref 12.0–46.0)
Lymphs Abs: 1.6 10*3/uL (ref 0.7–4.0)
MCHC: 32.2 g/dL (ref 30.0–36.0)
MCV: 82.4 fl (ref 78.0–100.0)
MONOS PCT: 9.2 % (ref 3.0–12.0)
Monocytes Absolute: 0.6 10*3/uL (ref 0.1–1.0)
Neutro Abs: 3.7 10*3/uL (ref 1.4–7.7)
Neutrophils Relative %: 59.9 % (ref 43.0–77.0)
Platelets: 304 10*3/uL (ref 150.0–400.0)
RBC: 4.23 Mil/uL (ref 3.87–5.11)
RDW: 20 % — AB (ref 11.5–15.5)
WBC: 6.1 10*3/uL (ref 4.0–10.5)

## 2017-08-26 LAB — TSH: TSH: 4.84 u[IU]/mL — ABNORMAL HIGH (ref 0.35–4.50)

## 2017-08-26 LAB — VITAMIN D 25 HYDROXY (VIT D DEFICIENCY, FRACTURES): VITD: 27.87 ng/mL — AB (ref 30.00–100.00)

## 2017-08-26 NOTE — Progress Notes (Signed)
Subjective:   Heather Potter is a 72 y.o. female who presents for Medicare Annual (Subsequent) preventive examination.  Review of Systems:  N/A Cardiac Risk Factors include: advanced age (>77men, >24 women);dyslipidemia;hypertension     Objective:     Vitals: BP 110/70 (BP Location: Right Arm, Patient Position: Sitting, Cuff Size: Normal)   Pulse 64   Temp 97.8 F (36.6 C) (Oral)   Ht 5' 2.5" (1.588 m) Comment: no shoes  Wt 147 lb 8 oz (66.9 kg)   SpO2 97%   BMI 26.55 kg/m   Body mass index is 26.55 kg/m.  Advanced Directives 08/26/2017 08/23/2016 05/25/2015  Does Patient Have a Medical Advance Directive? No No No  Would patient like information on creating a medical advance directive? No - Patient declined - No - patient declined information    Tobacco Social History   Tobacco Use  Smoking Status Former Smoker  . Last attempt to quit: 05/21/2007  . Years since quitting: 10.2  Smokeless Tobacco Never Used     Counseling given: No   Clinical Intake:  Pre-visit preparation completed: Yes  Pain : No/denies pain Pain Score: 0-No pain     Nutritional Status: BMI 25 -29 Overweight Nutritional Risks: None Diabetes: No  How often do you need to have someone help you when you read instructions, pamphlets, or other written materials from your doctor or pharmacy?: 1 - Never What is the last grade level you completed in school?: GED  Interpreter Needed?: No  Comments: pt lives with spouse Information entered by :: LPinson, LPN  Past Medical History:  Diagnosis Date  . Colitis   . History of blood transfusion    D and C and miscarriage  . History of gynecologic surgery    had right ovary removed, unsure about uterus/cervix  . Hyperlipidemia   . Hypertension   . Hypothyroidism   . Lichen planus 05/25/2692   Treated by Dr. Kellie Moor (derm) with methotrexate  . Osteoarthritis   . Osteopenia    Past Surgical History:  Procedure Laterality Date  . APPENDECTOMY     . [place]   colectomy for ? Diverticulits  . DILATION AND CURETTAGE OF UTERUS    . ESOPHAGEAL MANOMETRY N/A 08/21/2015   Procedure: ESOPHAGEAL MANOMETRY (EM);  Surgeon: Mauri Pole, MD;  Location: [facility];  Service: Endoscopy;  Laterality: N/A;  . HERNIA REPAIR     right lower abd  . 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 OOPHORECTOMY     right  . OTHER SURGICAL HISTORY     part of intestine removed  . TUBAL LIGATION     bilat   Family History  Problem Relation Age of Onset  . Pancreatic cancer Mother   . Heart attack Father   . Liver cancer Brother   . Colon cancer Neg Hx   . Breast cancer Neg Hx    Social History   Socioeconomic History  . Marital status: Married    Spouse name: Not on file  . Number of children: 1  . Years of education: Not on file  . Highest education level: Not on file  Occupational History  . Occupation: Agricultural engineer  Social Needs  . Financial resource strain: Not on file  . Food insecurity:    Worry: Not on file    Inability: Not on file  . Transportation needs:    Medical: Not on file    Non-medical: Not on file  Tobacco Use  . Smoking status: Former Smoker    Last attempt to quit: 05/21/2007    Years since quitting: 10.2  . Smokeless tobacco: Never Used  Substance and Sexual Activity  . Alcohol use: Yes    Comment: rare- wine  . Drug use: No  . Sexual activity: Not on file  Lifestyle  . Physical activity:    Days per week: Not on file    Minutes per session: Not on file  . Stress: Not on file  Relationships  . Social connections:    Talks on phone: Not on file    Gets together: Not on file    Attends religious service: Not on file    Active member of club or organization: Not on file    Attends meetings of clubs or organizations: Not on file    Relationship status: Not on file  Other Topics Concern  . Not on file  Social History Narrative   One adopted son   Regular exercise: yes      Does  not have a living will or HPOA.   Desires CPR and life support for only short period if not futile.          Outpatient Encounter Medications as of 08/26/2017  Medication Sig  . acetaminophen (TYLENOL) 325 MG tablet Take 325 mg by mouth every 6 (six) hours as needed. As needed  . amLODipine (NORVASC) 10 MG tablet Take 1 tablet by mouth daily (must schedule annual exam)  . aspirin 81 MG tablet Take 81 mg by mouth daily.    . Aspirin-Caffeine (BAYER BACK & BODY PO) Take 2 tablets by mouth as needed.  . Calcium Carbonate-Vit D-Min (CALCIUM 1200 PO) Take 1 tablet by mouth daily.  . cholecalciferol (VITAMIN D) 400 units TABS tablet Take 400 Units by mouth.  . folic acid (FOLVITE) 1 MG tablet Take 1 mg by mouth.  . hydrochlorothiazide (HYDRODIURIL) 25 MG tablet Take 1 tablet by mouth daily (must schedule annual exam)  . levothyroxine (SYNTHROID, LEVOTHROID) 75 MCG tablet Take 1 tablet (75 mcg total) by mouth daily.  . methotrexate (RHEUMATREX) 2.5 MG tablet Take 12.5 mg by mouth once a week. Caution:Chemotherapy. Protect from light.  . simvastatin (ZOCOR) 20 MG tablet Take 1/2 tablet by mouth at bedtime (must schedule annual exam)  . traMADol (ULTRAM) 50 MG tablet Take one tablet by mouth two times a day   No facility-administered encounter medications on file as of 08/26/2017.     Activities of Daily Living In your present state of health, do you have any difficulty performing the following activities: 08/26/2017  Hearing? N  Vision? N  Difficulty concentrating or making decisions? N  Walking or climbing stairs? N  Dressing or bathing? N  Doing errands, shopping? N  Preparing Food and eating ? N  Using the Toilet? N  In the past six months, have you accidently leaked urine? N  Do you have problems with loss of bowel control? N  Managing your Medications? N  Managing your Finances? N  Housekeeping or managing your Housekeeping? N  Some recent data might be hidden    Patient Care  Team: Elby Beck, FNP as PCP - General (Nurse Practitioner) Zehr, Laban Emperor, PA-C as Physician Assistant (Gastroenterology) Mauri Pole, MD as Consulting Physician (Gastroenterology)    Assessment:   This is a routine wellness examination for Esra.   Hearing Screening   125Hz  250Hz  500Hz  1000Hz  2000Hz  3000Hz  4000Hz  6000Hz  8000Hz   Right ear:   40 40 40  40    Left ear:   40 40 40  40      Visual Acuity Screening   Right eye Left eye Both eyes  Without correction: 20/40 20/50 20/30-1  With correction:        Exercise Activities and Dietary recommendations Current Exercise Habits: The patient does not participate in regular exercise at present, Exercise limited by: orthopedic condition(s)  Goals    . Follow up with Primary Care Provider     Starting 08/26/2017, I will continue to take medications as prescribed and to keep appointments with PCP as scheduled.        Fall Risk Fall Risk  08/26/2017 08/23/2016 07/06/2015 08/18/2013  Falls in the past year? Yes No No No  Number falls in past yr: 1 - - -  Injury with Fall? Yes - - -   Depression Screen PHQ 2/9 Scores 08/26/2017 08/23/2016 07/06/2015 08/18/2013  PHQ - 2 Score 2 3 0 0  PHQ- 9 Score 4 4 - -     Cognitive Function MMSE - Mini Mental State Exam 08/26/2017 08/23/2016  Orientation to time 5 5  Orientation to Place 5 5  Registration 3 3  Attention/ Calculation 0 0  Recall 3 3  Language- name 2 objects 0 0  Language- repeat 1 1  Language- follow 3 step command 3 3  Language- read & follow direction 0 0  Write a sentence 0 0  Copy design 0 0  Total score 20 20        Immunization History  Administered Date(s) Administered  . Influenza Whole 03/10/2013  . Influenza,inj,Quad PF,6+ Mos 02/16/2014, 02/06/2016  . Influenza-Unspecified 03/14/2015  . Pneumococcal Conjugate-13 08/18/2013  . Pneumococcal Polysaccharide-23 11/28/2010  . Zoster 11/28/2010    Screening Tests Health Maintenance  Topic Date Due   . INFLUENZA VACCINE  12/18/2017  . MAMMOGRAM  09/27/2018  . TETANUS/TDAP  11/27/2020  . COLONOSCOPY  12/18/2020  . DEXA SCAN  Completed  . Hepatitis C Screening  Completed  . PNA vac Low Risk Adult  Completed      Plan:     I have personally reviewed, addressed, and noted the following in the patient's chart:  A. Medical and social history B. Use of alcohol, tobacco or illicit drugs  C. Current medications and supplements D. Functional ability and status E.  Nutritional status F.  Physical activity G. Advance directives H. List of other physicians I.  Hospitalizations, surgeries, and ER visits in previous 12 months J.  [place] to include hearing, vision, cognitive, depression L. Referrals and appointments - none  In addition, I have reviewed and discussed with patient certain preventive protocols, quality metrics, and best practice recommendations. A written personalized care plan for preventive services as well as general preventive health recommendations were provided to patient.  See attached scanned questionnaire for additional information.   Signed,   Lindell Noe, MHA, BS, LPN Health Coach

## 2017-08-26 NOTE — Progress Notes (Signed)
I reviewed health advisor's note, was available for consultation, and agree with documentation and plan.  

## 2017-08-26 NOTE — Patient Instructions (Signed)
Heather Potter , Thank you for taking time to come for your Medicare Wellness Visit. I appreciate your ongoing commitment to your health goals. Please review the following plan we discussed and let me know if I can assist you in the future.   These are the goals we discussed: Goals    . Follow up with Primary Care Provider     Starting 08/26/2017, I will continue to take medications as prescribed and to keep appointments with PCP as scheduled.        This is a list of the screening recommended for you and due dates:  Health Maintenance  Topic Date Due  . Flu Shot  12/18/2017  . Mammogram  09/27/2018  . Tetanus Vaccine  11/27/2020  . Colon Cancer Screening  12/18/2020  . DEXA scan (bone density measurement)  Completed  .  Hepatitis C: One time screening is recommended by Center for Disease Control  (CDC) for  adults born from [birth year] through 1965.   Completed  . Pneumonia vaccines  Completed   Preventive Care for Adults  A healthy lifestyle and preventive care can promote health and wellness. Preventive health guidelines for adults include the following key practices.  . A routine yearly physical is a good way to check with your health care provider about your health and preventive screening. It is a chance to share any concerns and updates on your health and to receive a thorough exam.  . Visit your dentist for a routine exam and preventive care every 6 months. Brush your teeth twice a day and floss once a day. Good oral hygiene prevents tooth decay and gum disease.  . The frequency of eye exams is based on your age, health, family medical history, use  of contact lenses, and other factors. Follow your health care provider's recommendations for frequency of eye exams.  . Eat a healthy diet. Foods like vegetables, fruits, whole grains, low-fat dairy products, and lean protein foods contain the nutrients you need without too many calories. Decrease your intake of foods high in solid fats,  added sugars, and salt. Eat the right amount of calories for you. Get information about a proper diet from your health care provider, if necessary.  . Regular physical exercise is one of the most important things you can do for your health. Most adults should get at least 150 minutes of moderate-intensity exercise (any activity that increases your heart rate and causes you to sweat) each week. In addition, most adults need muscle-strengthening exercises on 2 or more days a week.  Silver Sneakers may be a benefit available to you. To determine eligibility, you may visit the website: www.silversneakers.com or contact program at 7824041994 Mon-Fri between 8AM-8PM.   . Maintain a healthy weight. The body mass index (BMI) is a screening tool to identify possible weight problems. It provides an estimate of body fat based on height and weight. Your health care provider can find your BMI and can help you achieve or maintain a healthy weight.   For adults 20 years and older: ? A BMI below 18.5 is considered underweight. ? A BMI of 18.5 to 24.9 is normal. ? A BMI of 25 to 29.9 is considered overweight. ? A BMI of 30 and above is considered obese.   . Maintain normal blood lipids and cholesterol levels by exercising and minimizing your intake of saturated fat. Eat a balanced diet with plenty of fruit and vegetables. Blood tests for lipids and cholesterol should begin at age  20 and be repeated every 5 years. If your lipid or cholesterol levels are high, you are over 50, or you are at high risk for heart disease, you may need your cholesterol levels checked more frequently. Ongoing high lipid and cholesterol levels should be treated with medicines if diet and exercise are not working.  . If you smoke, find out from your health care provider how to quit. If you do not use tobacco, please do not start.  . If you choose to drink alcohol, please do not consume more than 2 drinks per day. One drink is  considered to be 12 ounces (355 mL) of beer, 5 ounces (148 mL) of wine, or 1.5 ounces (44 mL) of liquor.  . If you are 58-33 years old, ask your health care provider if you should take aspirin to prevent strokes.  . Use sunscreen. Apply sunscreen liberally and repeatedly throughout the day. You should seek shade when your shadow is shorter than you. Protect yourself by wearing long sleeves, pants, a wide-brimmed hat, and sunglasses year round, whenever you are outdoors.  . Once a month, do a whole body skin exam, using a mirror to look at the skin on your back. Tell your health care provider of new moles, moles that have irregular borders, moles that are larger than a pencil eraser, or moles that have changed in shape or color.

## 2017-08-26 NOTE — Progress Notes (Signed)
PCP notes:   Health maintenance:  No gaps identified.   Abnormal screenings:   Fall risk - hx of single fall Fall Risk  08/26/2017 08/23/2016 07/06/2015 08/18/2013  Falls in the past year? Yes No No No  Number falls in past yr: 1 - - -  Injury with Fall? Yes - - -   Depression score: 4 Depression screen [facility] 2/9 08/26/2017 08/23/2016 07/06/2015 08/18/2013  Decreased Interest 1 2 0 0  Down, Depressed, Hopeless 1 1 0 0  PHQ - 2 Score 2 3 0 0  Altered sleeping 1 0 - -  Tired, decreased energy 1 1 - -  Change in appetite 0 0 - -  Feeling bad or failure about yourself  0 0 - -  Trouble concentrating 0 0 - -  Moving slowly or fidgety/restless 0 0 - -  Suicidal thoughts 0 0 - -  PHQ-9 Score 4 4 - -  Difficult doing work/chores Somewhat difficult Not difficult at all - -   Patient concerns:   Patient verbalized caregiver strain.  Nurse concerns:  None  Next PCP appt:   08/29/17 @ 0830

## 2017-08-27 ENCOUNTER — Encounter: Payer: PPO | Admitting: Family Medicine

## 2017-08-27 DIAGNOSIS — L438 Other lichen planus: Secondary | ICD-10-CM | POA: Diagnosis not present

## 2017-08-27 DIAGNOSIS — Z79899 Other long term (current) drug therapy: Secondary | ICD-10-CM | POA: Diagnosis not present

## 2017-08-29 ENCOUNTER — Encounter: Payer: Self-pay | Admitting: Family Medicine

## 2017-08-29 ENCOUNTER — Ambulatory Visit (INDEPENDENT_AMBULATORY_CARE_PROVIDER_SITE_OTHER): Payer: PPO | Admitting: Family Medicine

## 2017-08-29 VITALS — BP 118/64 | HR 81 | Temp 98.4°F | Wt 149.5 lb

## 2017-08-29 DIAGNOSIS — Z0000 Encounter for general adult medical examination without abnormal findings: Secondary | ICD-10-CM | POA: Diagnosis not present

## 2017-08-29 NOTE — Patient Instructions (Signed)
Good to see you today! Please follow up in 6 months for a check up  [place] These Tests  Blood Pressure- Have your blood pressure checked by your healthcare provider at least once a year.  Normal blood pressure is 120/80.  Weight- Have your body mass index (BMI) calculated to screen for obesity.  BMI is a measure of body fat based on height and weight.  You can calculate your own BMI at [employer]velB[employer].it  Cholesterol- Have your cholesterol checked every year.  Diabetes- Have your blood sugar checked every year if you have high blood pressure, high cholesterol, a family history of diabetes or if you are overweight.  Pap Test - Have a pap test every 1 to 5 years if you have been sexually active.  If you are older than 65 and recent pap tests have been normal you may not need additional pap tests.  In addition, if you have had a hysterectomy  for benign disease additional pap tests are not necessary.  Mammogram-Yearly mammograms are essential for early detection of breast cancer  Screening for Colon Cancer- Colonoscopy starting at age 57. Screening may begin sooner depending on your family history and other health conditions.  Follow up colonoscopy as directed by your Gastroenterologist.  Screening for Osteoporosis- Screening begins at age 70 with bone density scanning, sooner if you are at higher risk for developing Osteoporosis.  Get these medicines  Calcium with Vitamin D- Your body requires 1200-1500 mg of Calcium a day and [phone] IU of Vitamin D a day.  You can only absorb 500 mg of Calcium at a time therefore Calcium must be taken in 2 or 3 separate doses throughout the day.  Hormones- Hormone therapy has been associated with increased risk for certain cancers and heart disease.  Talk to your healthcare provider about if you need relief from menopausal symptoms.  Aspirin- Ask your healthcare provider about taking Aspirin to prevent Heart Disease and  Stroke.  Get these Immuniztions  Flu shot- Every fall  Pneumonia shot- Once after the age of 25; if you are younger ask your healthcare provider if you need a pneumonia shot.  Tetanus- Every ten years.  Zostavax- Once after the age of 58 to prevent shingles.  Take these steps  Don't smoke- Your healthcare provider can help you quit. For tips on how to quit, ask your healthcare provider or go to www.smokefree.gov or call 1-800 QUIT-NOW.  Be physically active- Exercise 5 days a week for a minimum of 30 minutes.  If you are not already physically active, start slow and gradually work up to 30 minutes of moderate physical activity.  Try walking, dancing, bike riding, swimming, etc.  Eat a healthy diet- Eat a variety of healthy foods such as fruits, vegetables, whole grains, low fat milk, low fat cheeses, yogurt, lean meats, chicken, fish, eggs, dried beans, tofu, etc.  For more information go to www.thenutritionsource.org  Dental visit- Brush and floss teeth twice daily; visit your dentist twice a year.  Eye exam- Visit your Optometrist or Ophthalmologist yearly.  Drink alcohol in moderation- Limit alcohol intake to one drink or less a day.  Never drink and drive.  Depression- Your emotional health is as important as your physical health.  If you're feeling down or losing interest in things you normally enjoy, please talk to your healthcare provider.  Seat Belts- can save your life; always wear one  Smoke/Carbon Monoxide detectors- These detectors need to be installed on the  appropriate level of your home.  Replace batteries at least once a year.  Violence- If anyone is threatening or hurting you, please tell your healthcare provider.  Living Will/ Health care power of attorney- Discuss with your healthcare provider and family.

## 2017-08-29 NOTE — Progress Notes (Signed)
Subjective:    Patient ID: Heather Potter, female    DOB: Jun 02, 1945, 72 y.o.   MRN: [id number]  HPI This is a 72 yo female who presents today for CPE. She had her Medicare AWV earlier this week. Has been doing well. Lives with her husband and MIL. MIL rarely leaves house, makes it difficult for her to do things she enjoys due to care taking responsibilities.   Last CPE- 08/28/16 Mammo- 09/26/16 Pap- hysterectomy Colonoscopy- 6/99, declines additional screening Tdap- declines Flu- annual Eye- recent Dental- regular Exercise- has been limited some with right knee and bilateral shoulder pain   Past Medical History:  Diagnosis Date  . Colitis   . History of blood transfusion    D and C and miscarriage  . History of gynecologic surgery    had right ovary removed, unsure about uterus/cervix  . Hyperlipidemia   . Hypertension   . Hypothyroidism   . Lichen planus 9/89/2119   Treated by Dr. Kellie Moor (derm) with methotrexate  . Osteoarthritis   . Osteopenia    Past Surgical History:  Procedure Laterality Date  . APPENDECTOMY    . [place]   colectomy for ? Diverticulits  . DILATION AND CURETTAGE OF UTERUS    . ESOPHAGEAL MANOMETRY N/A 08/21/2015   Procedure: ESOPHAGEAL MANOMETRY (EM);  Surgeon: Mauri Pole, MD;  Location: [facility];  Service: Endoscopy;  Laterality: N/A;  . HERNIA REPAIR     right lower abd  . 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 OOPHORECTOMY     right  . OTHER SURGICAL HISTORY     part of intestine removed  . TUBAL LIGATION     bilat   Family History  Problem Relation Age of Onset  . Pancreatic cancer Mother   . Heart attack Father   . Liver cancer Brother   . Colon cancer Neg Hx   . Breast cancer Neg Hx    Social History   Tobacco Use  . Smoking status: Former Smoker    Last attempt to quit: 05/21/2007    Years since quitting: 10.2  . Smokeless tobacco: Never Used  Substance Use Topics  . Alcohol use: Yes      Comment: rare- wine  . Drug use: No     Review of Systems  Constitutional: Negative for fatigue and unexpected weight change.  HENT: Negative.   Eyes: Negative.   Respiratory: Negative.   Cardiovascular: Negative.   Gastrointestinal: Negative.   Genitourinary: Negative.        No nocturia.   Musculoskeletal:       Chronic shoulder and hip pain. Some improvement with acetaminophen.   Skin: Negative.   Allergic/Immunologic: Negative.   Neurological: Negative.   Hematological: Negative.   Psychiatric/Behavioral: Negative.        Objective:   Physical Exam  Constitutional: She is oriented to person, place, and time. She appears well-developed and well-nourished. No distress.  HENT:  Head: Normocephalic and atraumatic.  Mouth/Throat: Oropharynx is clear and moist.  Eyes: Conjunctivae are normal.  Neck: Normal range of motion. Neck supple.  Cardiovascular: Normal rate, regular rhythm and normal heart sounds.  Pulmonary/Chest: Effort normal and breath sounds normal. Right breast exhibits inverted nipple. Right breast exhibits no mass, no nipple discharge, no skin change and no tenderness. Left breast exhibits inverted nipple. Left breast exhibits no mass, no nipple discharge, no skin change and no tenderness. Breasts are symmetrical.  Patient reports  inverted nipples chronic.   Abdominal: Soft. Bowel sounds are normal. She exhibits no distension. There is no tenderness. There is no rebound and no guarding.  Musculoskeletal: She exhibits no edema.  Hips and shoulders with good ROM. UE/LE strength 5/5.   Neurological: She is alert and oriented to person, place, and time.  Skin: Skin is warm and dry. She is not diaphoretic.  Psychiatric: She has a normal mood and affect. Her behavior is normal. Judgment and thought content normal.  Vitals reviewed.     BP 118/64   Pulse 81   Temp 98.4 F (36.9 C) (Oral)   Wt 149 lb 8 oz (67.8 kg)   SpO2 96%   BMI 26.91 kg/m  Wt  Readings from Last 3 Encounters:  08/29/17 149 lb 8 oz (67.8 kg)  08/26/17 147 lb 8 oz (66.9 kg)  06/27/17 146 lb 8 oz (66.5 kg)   Depression screen [facility] 2/9 08/26/2017 08/23/2016 07/06/2015 08/18/2013  Decreased Interest 1 2 0 0  Down, Depressed, Hopeless 1 1 0 0  PHQ - 2 Score 2 3 0 0  Altered sleeping 1 0 - -  Tired, decreased energy 1 1 - -  Change in appetite 0 0 - -  Feeling bad or failure about yourself  0 0 - -  Trouble concentrating 0 0 - -  Moving slowly or fidgety/restless 0 0 - -  Suicidal thoughts 0 0 - -  PHQ-9 Score 4 4 - -  Difficult doing work/chores Somewhat difficult Not difficult at all - -       Assessment & Plan:  1. Annual physical exam - Discussed and encouraged healthy lifestyle choices- adequate sleep, regular exercise, stress management and healthy food choices.  - discussed labs - encouraged increased exercise and socialization - follow up in 6 months   Clarene Reamer, FNP-BC  [facility], [place]  08/30/2017 11:50 AM

## 2017-09-12 ENCOUNTER — Other Ambulatory Visit: Payer: Self-pay | Admitting: Family Medicine

## 2017-09-12 DIAGNOSIS — E039 Hypothyroidism, unspecified: Secondary | ICD-10-CM

## 2017-09-12 NOTE — Telephone Encounter (Signed)
TSH slightly above goal on last visit. Recommend to continue current dose of levothyroxine at 75 mcg and repeat TSH later this year. Refills sent to pharmacy.

## 2017-09-12 NOTE — Telephone Encounter (Signed)
DG-Plz see refill req/thx dmf 

## 2017-10-14 ENCOUNTER — Other Ambulatory Visit: Payer: Self-pay | Admitting: Family Medicine

## 2017-10-14 DIAGNOSIS — M19019 Primary osteoarthritis, unspecified shoulder: Secondary | ICD-10-CM

## 2017-10-14 NOTE — Telephone Encounter (Signed)
Copied from [place] [id number]. Topic: Quick Communication - Rx Refill/Question >> Oct 14, 2017  3:21 PM Oliver Pila B wrote: Medication: traMADol (ULTRAM) 50 MG tablet [[id number]  Has the patient contacted their pharmacy? Yes.   (Agent: If no, request that the patient contact the pharmacy for the refill.) (Agent: If yes, when and what did the pharmacy advise?)  Preferred Pharm[facility] (with phone number [facility] street [facility]  Agent: Please be advised that RX refills may take up to 3 business days. We ask that you follow-up with your pharmacy.

## 2017-10-14 NOTE — Telephone Encounter (Signed)
Refill info verified; please advise.

## 2017-10-14 NOTE — Telephone Encounter (Signed)
Refill  Request ultram 50 mg  LOV 08/29/2017 [facility]  Last filled 06/27/2017  180 tabs bid prn  Pharmacy on file

## 2017-10-15 MED ORDER — TRAMADOL HCL 50 MG PO TABS: ORAL_TABLET | ORAL | 0 refills | Status: DC

## 2017-10-29 ENCOUNTER — Ambulatory Visit
Admission: RE | Admit: 2017-10-29 | Discharge: 2017-10-29 | Disposition: A | Discharge status: Home / Self Care | Payer: PPO | Source: Ambulatory Visit | Attending: Family Medicine | Admitting: Family Medicine

## 2017-10-29 DIAGNOSIS — Z1231 Encounter for screening mammogram for malignant neoplasm of breast: Secondary | ICD-10-CM

## 2017-10-29 IMAGING — MG MM DIGITAL SCREENING BILAT W/ CAD
4 series · 4 of 4 positions shown · non-contrast
Comparison: Previous exam(s).

CLINICAL DATA: Screening.

EXAM:
DIGITAL SCREENING BILATERAL MAMMOGRAM WITH CAD

[L CC]
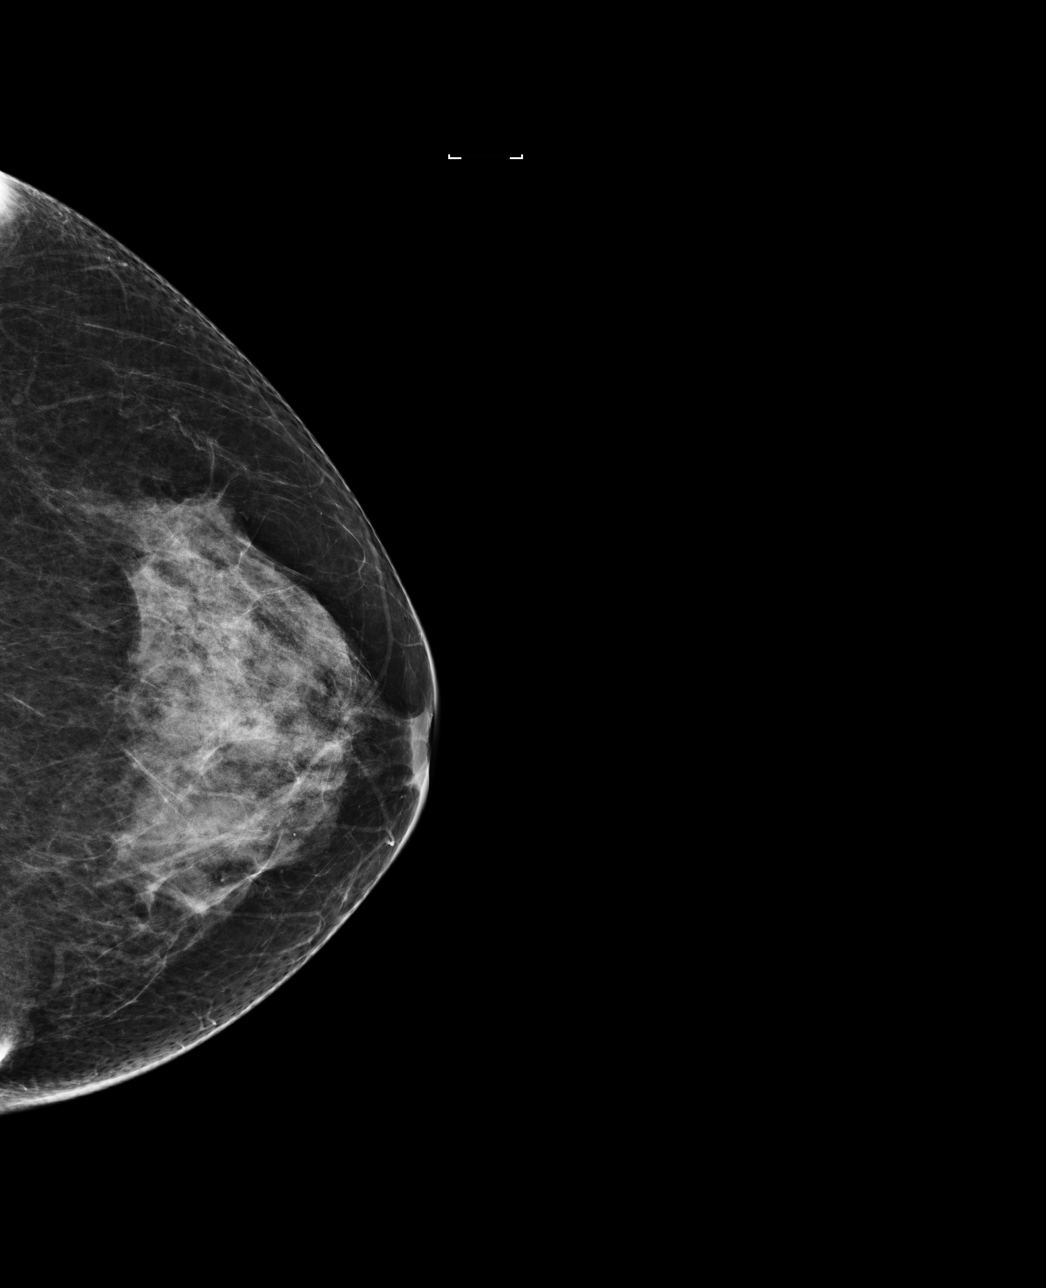

[R MLO]
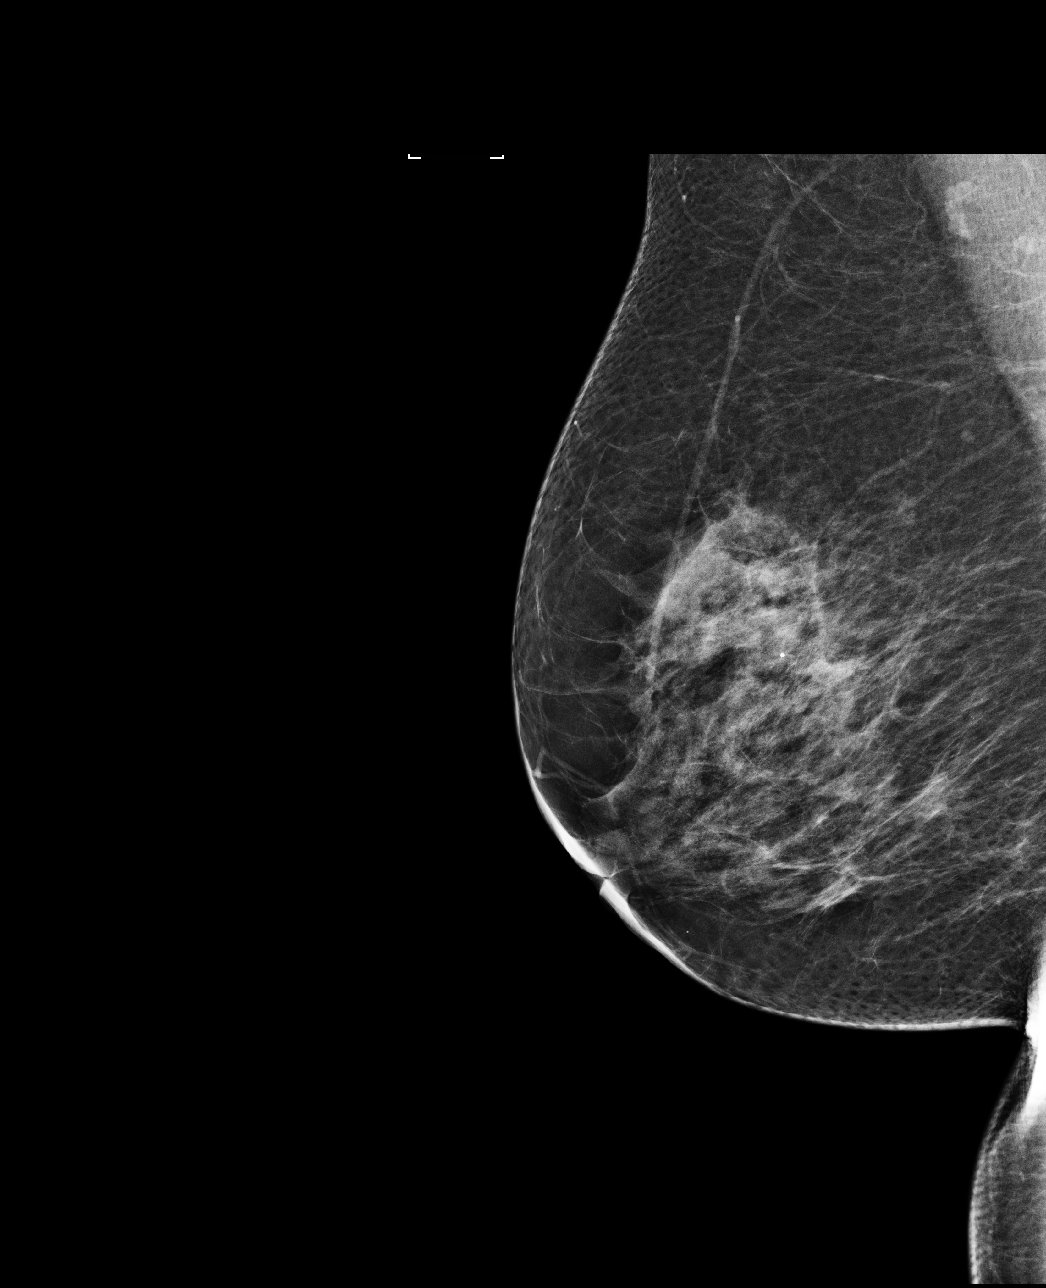

[L MLO]
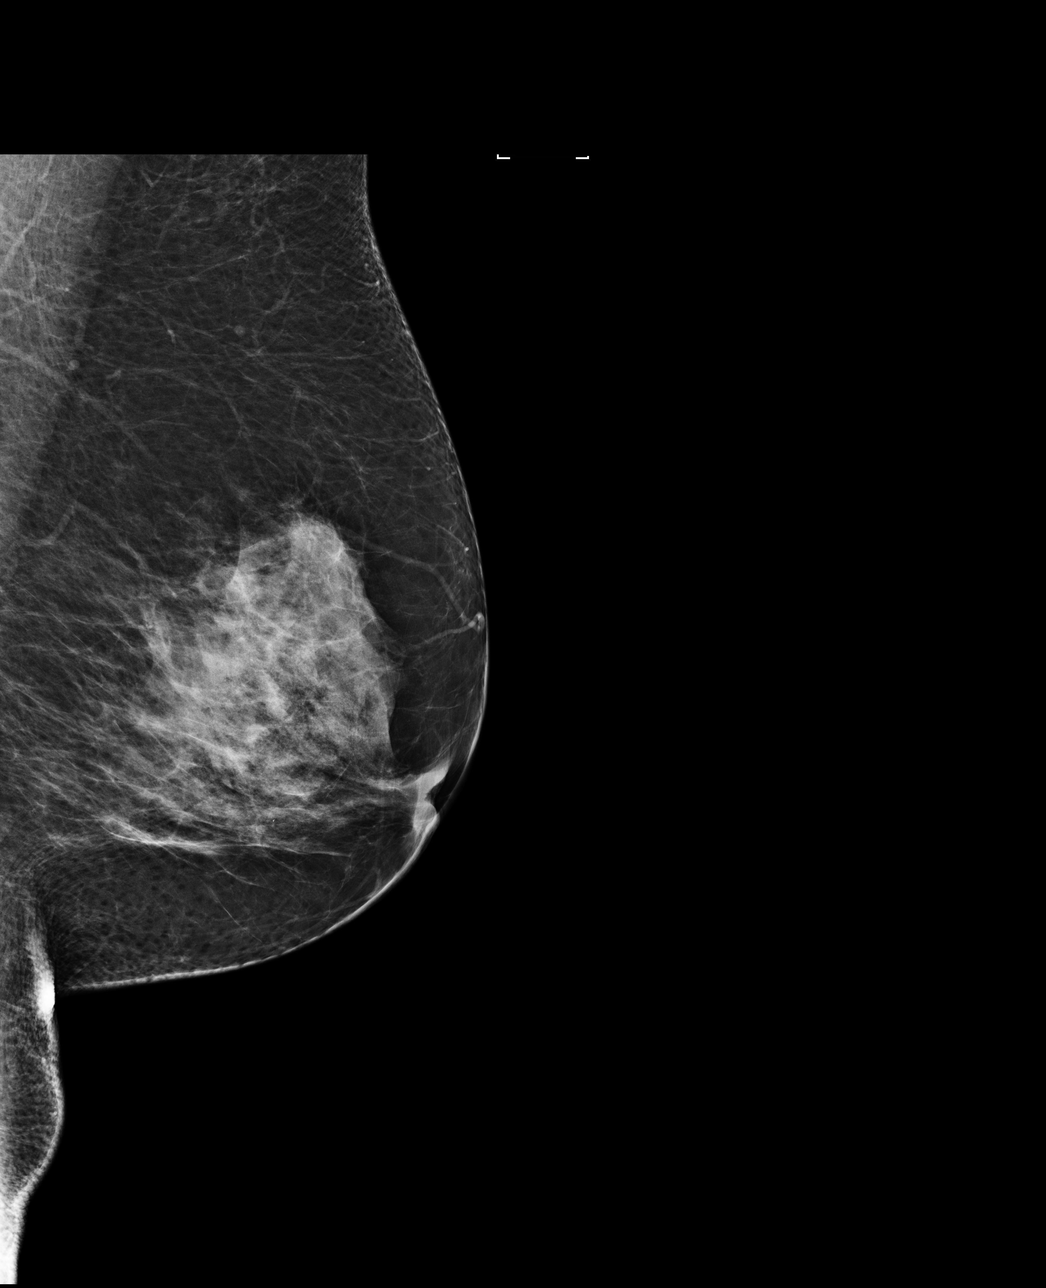

[R CC]
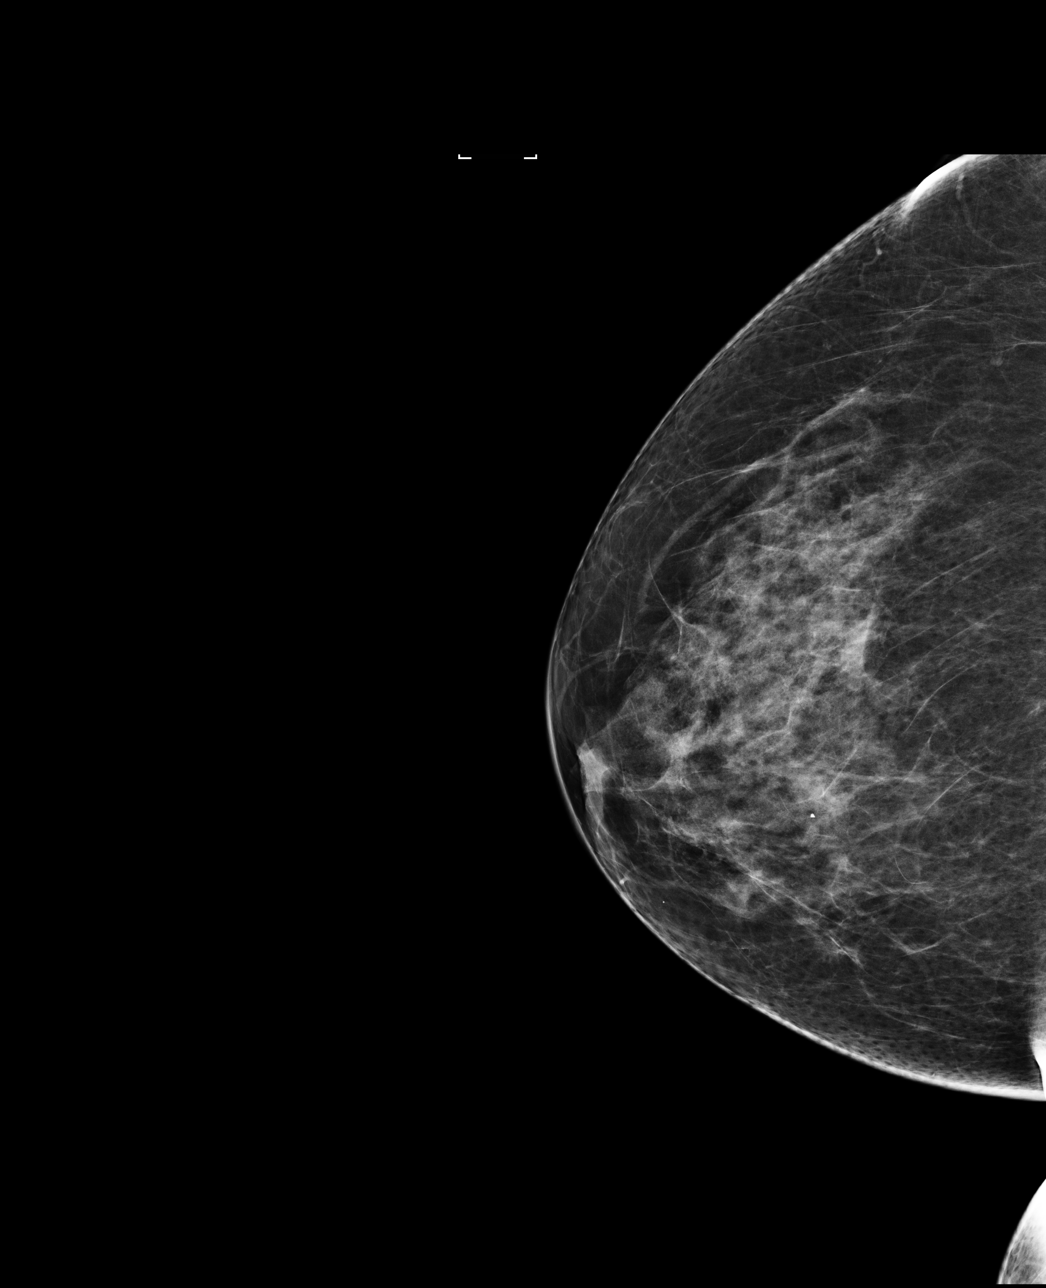

[4 of 4 positions shown; findings below may reference images not displayed]

ACR Breast Density Category c: The breast tissue is heterogeneously
dense, which may obscure small masses.
FINDINGS: There are no findings suspicious for malignancy. Images were
processed with CAD.
IMPRESSION: No mammographic evidence of malignancy. A result letter of this
screening mammogram will be mailed directly to the patient.

RECOMMENDATION:
Screening mammogram in one year. (Code:[0J])

BI-RADS CATEGORY  1: Negative.

## 2017-11-14 ENCOUNTER — Other Ambulatory Visit: Payer: Self-pay | Admitting: Family Medicine

## 2017-11-14 DIAGNOSIS — E039 Hypothyroidism, unspecified: Secondary | ICD-10-CM

## 2017-11-14 NOTE — Telephone Encounter (Signed)
DG-Plz see refill req/thx dmf 

## 2017-12-31 ENCOUNTER — Encounter: Payer: Self-pay | Admitting: Family Medicine

## 2017-12-31 ENCOUNTER — Ambulatory Visit (INDEPENDENT_AMBULATORY_CARE_PROVIDER_SITE_OTHER): Payer: PPO | Admitting: Family Medicine

## 2017-12-31 VITALS — BP 118/64 | HR 66 | Temp 98.2°F | Ht 62.5 in | Wt 145.8 lb

## 2017-12-31 DIAGNOSIS — M19019 Primary osteoarthritis, unspecified shoulder: Secondary | ICD-10-CM | POA: Diagnosis not present

## 2017-12-31 DIAGNOSIS — M19041 Primary osteoarthritis, right hand: Secondary | ICD-10-CM | POA: Diagnosis not present

## 2017-12-31 DIAGNOSIS — E038 Other specified hypothyroidism: Secondary | ICD-10-CM

## 2017-12-31 DIAGNOSIS — R109 Unspecified abdominal pain: Secondary | ICD-10-CM

## 2017-12-31 DIAGNOSIS — M19042 Primary osteoarthritis, left hand: Secondary | ICD-10-CM | POA: Diagnosis not present

## 2017-12-31 DIAGNOSIS — R7989 Other specified abnormal findings of blood chemistry: Secondary | ICD-10-CM

## 2017-12-31 LAB — CBC WITH DIFFERENTIAL/PLATELET
BASOS PCT: 1.9 % (ref 0.0–3.0)
Basophils Absolute: 0.1 10*3/uL (ref 0.0–0.1)
EOS PCT: 2.9 % (ref 0.0–5.0)
Eosinophils Absolute: 0.2 10*3/uL (ref 0.0–0.7)
HCT: 42 % (ref 36.0–46.0)
Hemoglobin: 13.8 g/dL (ref 12.0–15.0)
LYMPHS ABS: 2 10*3/uL (ref 0.7–4.0)
Lymphocytes Relative: 26.9 % (ref 12.0–46.0)
MCHC: 32.9 g/dL (ref 30.0–36.0)
MCV: 88.5 fl (ref 78.0–100.0)
MONOS PCT: 10.8 % (ref 3.0–12.0)
Monocytes Absolute: 0.8 10*3/uL (ref 0.1–1.0)
NEUTROS PCT: 57.5 % (ref 43.0–77.0)
Neutro Abs: 4.4 10*3/uL (ref 1.4–7.7)
Platelets: 237 10*3/uL (ref 150.0–400.0)
RBC: 4.75 Mil/uL (ref 3.87–5.11)
RDW: 16.7 % — AB (ref 11.5–15.5)
WBC: 7.6 10*3/uL (ref 4.0–10.5)

## 2017-12-31 LAB — COMPREHENSIVE METABOLIC PANEL
ALK PHOS: 79 U/L (ref 39–117)
ALT: 15 U/L (ref 0–35)
AST: 21 U/L (ref 0–37)
Albumin: 4.6 g/dL (ref 3.5–5.2)
BUN: 24 mg/dL — ABNORMAL HIGH (ref 6–23)
CO2: 29 mEq/L (ref 19–32)
Calcium: 10.7 mg/dL — ABNORMAL HIGH (ref 8.4–10.5)
Chloride: 103 mEq/L (ref 96–112)
Creatinine, Ser: 1.16 mg/dL (ref 0.40–1.20)
GFR: 48.75 mL/min — AB (ref >60.00)
GLUCOSE: 102 mg/dL — AB (ref 70–99)
POTASSIUM: 4.5 meq/L (ref 3.5–5.1)
Sodium: 141 mEq/L (ref 135–145)
TOTAL PROTEIN: 8 g/dL (ref 6.0–8.3)
Total Bilirubin: 0.7 mg/dL (ref 0.2–1.2)

## 2017-12-31 LAB — TSH: TSH: 2.89 u[IU]/mL (ref 0.35–4.50)

## 2017-12-31 MED ORDER — GABAPENTIN 100 MG PO CAPS: ORAL_CAPSULE | ORAL | 4 refills | Status: DC

## 2017-12-31 NOTE — Patient Instructions (Addendum)
Good to see you today  Please stop at front desk to schedule your ultrasound  Stop at lab for blood work  Follow up in 3 months- let's see how the gabapentin is doing

## 2017-12-31 NOTE — Progress Notes (Signed)
Subjective:    Patient ID: Heather Potter, female    DOB: 03/31/1946, 72 y.o.   MRN: [id number]  HPI This is a 72 yo female who presents today for follow up if HTN, hypothyroidism.  Right sided abdominal pain, intermittent x 2 months. Pain with prolonged sitting and standing. Has history of hernia repair on that side. Some relief with acetaminophen. Feels numb, does not feel like a muscle, deeper. No change in BM, no blood, no mucus. Pain will last for days to weeks, resolve, return. No identifiable triggers. 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   Watching diet and has lost a few pounds. Works outside a lot in summer.   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 Takes tramadol BID. Is interested in trying gabapentin. Pain keeps her awake some nights. Taking ginger supplement with some improvement.   Past Medical History:  Diagnosis Date  . Colitis   . History of blood transfusion    D and C and miscarriage  . History of gynecologic surgery    had right ovary removed, unsure about uterus/cervix  . Hyperlipidemia   . Hypertension   . Hypothyroidism   . Lichen planus 09/13/621   Treated by Dr. Kellie Moor (derm) with methotrexate  . Osteoarthritis   . Osteopenia    Past Surgical History:  Procedure Laterality Date  . APPENDECTOMY    . [place]   colectomy for ? Diverticulits  . DILATION AND CURETTAGE OF UTERUS    . ESOPHAGEAL MANOMETRY N/A 08/21/2015   Procedure: ESOPHAGEAL MANOMETRY (EM);  Surgeon: Mauri Pole, MD;  Location: [facility];  Service: Endoscopy;  Laterality: N/A;  . HERNIA REPAIR     right lower abd  . 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 OOPHORECTOMY     right  . OTHER SURGICAL HISTORY     part of intestine removed  . TUBAL LIGATION     bilat   Family History  Problem Relation Age of Onset  . Pancreatic cancer Mother   . Heart attack Father     . Liver cancer Brother   . Colon cancer Neg Hx   . Breast cancer Neg Hx    Social History   Tobacco Use  . Smoking status: Former Smoker    Last attempt to quit: 05/21/2007    Years since quitting: 10.6  . Smokeless tobacco: Never Used  Substance Use Topics  . Alcohol use: Yes    Comment: rare- wine  . Drug use: No      Review of Systems Per HPI    Objective:   Physical Exam  Constitutional: She is oriented to person, place, and time. She appears well-developed and well-nourished.  HENT:  Head: Normocephalic and atraumatic.  Eyes: Conjunctivae are normal.  Neck: Normal range of motion. Neck supple.  Cardiovascular: Normal rate, regular rhythm and normal heart sounds.  Pulmonary/Chest: Effort normal and breath sounds normal.  Abdominal: Soft. Bowel sounds are normal. She exhibits no distension and no mass. There is no tenderness. There is no rebound and no guarding. No hernia (with abdominal muscle flexion).  Neurological: She is alert and oriented to person, place, and time.  Skin: Skin is warm and dry.  Psychiatric: She has a normal mood and affect. Her behavior is normal. Judgment and thought content normal.  Vitals reviewed.     BP 118/64 (BP Location: Right Arm, Patient Position: Sitting,  Cuff Size: Normal)   Pulse 66   Temp 98.2 F (36.8 C) (Oral)   Ht 5' 2.5" (1.588 m)   Wt 145 lb 12 oz (66.1 kg)   SpO2 97%   BMI 26.23 kg/m  Wt Readings from Last 3 Encounters:  12/31/17 145 lb 12 oz (66.1 kg)  08/29/17 149 lb 8 oz (67.8 kg)  08/26/17 147 lb 8 oz (66.9 kg)       Assessment & Plan:  1. Other specified hypothyroidism - TSH  2. Osteoarthritis of shoulder, unspecified laterality, unspecified osteoarthritis type - gabapentin (NEURONTIN) 100 MG capsule; Take one tablet at bedtime x 5 days then increase to twice a day  Dispense: 60 capsule; Refill: 4- discussed potential side effects of medication  3. Primary osteoarthritis of both hands - gabapentin  (NEURONTIN) 100 MG capsule; Take one tablet at bedtime x 5 days then increase to twice a day  Dispense: 60 capsule; Refill: 4  4. Right sided abdominal pain - US Abdomen Complete; Future - CBC with Differential - Comprehensive metabolic panel - RTC precautions reviewed  - follow up on file for 10/19 Clarene Reamer, FNP-BC  [facility], [place]  12/31/2017 10:19 AM

## 2018-01-02 NOTE — Addendum Note (Signed)
Addended by: Clarene Reamer B on: 01/02/2018 05:28 AM   Modules accepted: Orders

## 2018-01-05 ENCOUNTER — Ambulatory Visit
Admission: RE | Admit: 2018-01-05 | Discharge: 2018-01-05 | Disposition: A | Discharge status: Home / Self Care | Payer: PPO | Source: Ambulatory Visit | Attending: Family Medicine | Admitting: Family Medicine

## 2018-01-05 DIAGNOSIS — Z905 Acquired absence of kidney: Secondary | ICD-10-CM | POA: Insufficient documentation

## 2018-01-05 DIAGNOSIS — R109 Unspecified abdominal pain: Secondary | ICD-10-CM | POA: Insufficient documentation

## 2018-01-16 ENCOUNTER — Other Ambulatory Visit (INDEPENDENT_AMBULATORY_CARE_PROVIDER_SITE_OTHER): Payer: PPO

## 2018-01-16 DIAGNOSIS — R7989 Other specified abnormal findings of blood chemistry: Secondary | ICD-10-CM

## 2018-01-16 LAB — BASIC METABOLIC PANEL
BUN: 18 mg/dL (ref 6–23)
CO2: 28 mEq/L (ref 19–32)
CREATININE: 1.01 mg/dL (ref 0.40–1.20)
Calcium: 9.7 mg/dL (ref 8.4–10.5)
Chloride: 104 mEq/L (ref 96–112)
GFR: 57.2 mL/min — AB (ref >60.00)
GLUCOSE: 111 mg/dL — AB (ref 70–99)
Potassium: 3.6 mEq/L (ref 3.5–5.1)
Sodium: 140 mEq/L (ref 135–145)

## 2018-02-02 ENCOUNTER — Other Ambulatory Visit: Payer: Self-pay | Admitting: Family Medicine

## 2018-02-02 DIAGNOSIS — M19019 Primary osteoarthritis, unspecified shoulder: Secondary | ICD-10-CM

## 2018-02-02 MED ORDER — TRAMADOL HCL 50 MG PO TABS: ORAL_TABLET | ORAL | 0 refills | Status: DC

## 2018-02-02 NOTE — Telephone Encounter (Signed)
Refill of Tramadol  LRF 10/15/17  #180  0 refills  LOV 12/31/17 D. [place]

## 2018-02-02 NOTE — Telephone Encounter (Signed)
Name of Medication: Tramadol Name of Pharmacy: [facility] or Written Date and Quantity: #180 on 10/15/17 Last Office Visit and Type: 12/31/17 FU Next Office Visit and Type: 03/02/18 6 mth FU Last Controlled Substance Agreement Date:none  Last AYO:KHTX

## 2018-02-02 NOTE — Telephone Encounter (Signed)
Copied from [place] [id number]. Topic: Quick Communication - See Telephone Encounter >> Feb 02, 2018 10:15 AM Hewitt Shorts wrote: Pt is needing a refill on tramadol   [facility] pharmacy   Best number [phone]

## 2018-02-26 DIAGNOSIS — L821 Other seborrheic keratosis: Secondary | ICD-10-CM | POA: Diagnosis not present

## 2018-02-26 DIAGNOSIS — D2271 Melanocytic nevi of right lower limb, including hip: Secondary | ICD-10-CM | POA: Diagnosis not present

## 2018-02-26 DIAGNOSIS — L438 Other lichen planus: Secondary | ICD-10-CM | POA: Diagnosis not present

## 2018-02-26 DIAGNOSIS — D2272 Melanocytic nevi of left lower limb, including hip: Secondary | ICD-10-CM | POA: Diagnosis not present

## 2018-02-26 DIAGNOSIS — D2262 Melanocytic nevi of left upper limb, including shoulder: Secondary | ICD-10-CM | POA: Diagnosis not present

## 2018-02-26 DIAGNOSIS — D2261 Melanocytic nevi of right upper limb, including shoulder: Secondary | ICD-10-CM | POA: Diagnosis not present

## 2018-02-26 DIAGNOSIS — D225 Melanocytic nevi of trunk: Secondary | ICD-10-CM | POA: Diagnosis not present

## 2018-03-02 ENCOUNTER — Encounter: Payer: Self-pay | Admitting: Family Medicine

## 2018-03-02 ENCOUNTER — Ambulatory Visit (INDEPENDENT_AMBULATORY_CARE_PROVIDER_SITE_OTHER): Payer: PPO | Admitting: Family Medicine

## 2018-03-02 VITALS — BP 118/70 | HR 68 | Temp 98.3°F | Ht 62.5 in | Wt 144.1 lb

## 2018-03-02 DIAGNOSIS — M19041 Primary osteoarthritis, right hand: Secondary | ICD-10-CM

## 2018-03-02 DIAGNOSIS — M19042 Primary osteoarthritis, left hand: Secondary | ICD-10-CM

## 2018-03-02 DIAGNOSIS — R109 Unspecified abdominal pain: Secondary | ICD-10-CM

## 2018-03-02 DIAGNOSIS — I10 Essential (primary) hypertension: Secondary | ICD-10-CM

## 2018-03-02 NOTE — Progress Notes (Signed)
Subjective:    Patient ID: Heather Potter, female    DOB: [birth year], 72 y.o.   MRN: [id number]  HPI This is a 72 yo female who presents today for follow up of renal insufficiency, right sided abdominal pain and arthritis.  Her MIL was hospitalized recently with DVT/PE, patient is having to give her shots daily. She has someone who is going to help her so she can visit her son in [place] in Dec. Patient's brother is going to visit for Thanksgiving, she is looking forward to him coming.  Arthritis- stable, am stiffness, swelling, manages with tramadol BID. Unable to take NSAIDs due to kidney function decreased.  Has occasional pain right side, ? Related to mowing or other yard work (pruning, weeding). Resolves spontaneously. Had negative abdominal US 8/19.   Past Medical History:  Diagnosis Date  . Colitis   . History of blood transfusion    D and C and miscarriage  . History of gynecologic surgery    had right ovary removed, unsure about uterus/cervix  . Hyperlipidemia   . Hypertension   . Hypothyroidism   . Lichen planus 5/88/5027   Treated by Dr. Kellie Moor (derm) with methotrexate  . Osteoarthritis   . Osteopenia    Past Surgical History:  Procedure Laterality Date  . APPENDECTOMY    . [place]   colectomy for ? Diverticulits  . DILATION AND CURETTAGE OF UTERUS    . ESOPHAGEAL MANOMETRY N/A 08/21/2015   Procedure: ESOPHAGEAL MANOMETRY (EM);  Surgeon: Mauri Pole, MD;  Location: [facility];  Service: Endoscopy;  Laterality: N/A;  . HERNIA REPAIR     right lower abd  . 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 OOPHORECTOMY     right  . OTHER SURGICAL HISTORY     part of intestine removed  . TUBAL LIGATION     bilat   Family History  Problem Relation Age of Onset  . Pancreatic cancer Mother   . Heart attack Father   . Liver cancer Brother   . Colon cancer Neg Hx   . Breast cancer Neg Hx    Social History   Tobacco Use  . Smoking status:  Former Smoker    Last attempt to quit: 05/21/2007    Years since quitting: 10.7  . Smokeless tobacco: Never Used  Substance Use Topics  . Alcohol use: Yes    Comment: rare- wine  . Drug use: No      Review of Systems Per HPI    Objective:   Physical Exam  Constitutional: She is oriented to person, place, and time. She appears well-developed and well-nourished. No distress.  HENT:  Head: Normocephalic and atraumatic.  Eyes: Conjunctivae are normal.  Cardiovascular: Normal rate, regular rhythm and normal heart sounds.  Pulmonary/Chest: Effort normal and breath sounds normal.  Musculoskeletal:  Bilateral hands with joint deformity, decreased ROM.   Neurological: She is alert and oriented to person, place, and time.  Skin: Skin is warm and dry. She is not diaphoretic.  Psychiatric: She has a normal mood and affect. Her behavior is normal. Judgment and thought content normal.  Vitals reviewed.     BP 118/70 (BP Location: Left Arm, Patient Position: Sitting, Cuff Size: Normal)   Pulse 68   Temp 98.3 F (36.8 C) (Oral)   Ht 5' 2.5" (1.588 m)   Wt 144 lb 1.9 oz (65.4 kg)   SpO2 97%   BMI 25.94 kg/m  Wt Readings from Last 3 Encounters:  03/02/18 144 lb 1.9 oz (65.4 kg)  12/31/17 145 lb 12 oz (66.1 kg)  08/29/17 149 lb 8 oz (67.8 kg)       Assessment & Plan:  1. Primary osteoarthritis of both hands - continue current meds, activity as tolerated  2. Essential hypertension - well controlled  3. Right sided abdominal pain - decreased in severity/frequency, ? Adhesions, she will follow up if any increased severity or frequency  - follow up on file for CPE in 6 months   Clarene Reamer, FNP-BC  [facility], [place]  03/02/2018 9:26 AM

## 2018-03-02 NOTE — Patient Instructions (Signed)
Please schedule a follow up appointment in 4 months, will recheck your labs at that time

## 2018-04-09 ENCOUNTER — Other Ambulatory Visit: Payer: Self-pay | Admitting: Family Medicine

## 2018-04-09 ENCOUNTER — Telehealth: Payer: Self-pay | Admitting: Family Medicine

## 2018-04-09 DIAGNOSIS — I10 Essential (primary) hypertension: Secondary | ICD-10-CM

## 2018-04-09 DIAGNOSIS — E785 Hyperlipidemia, unspecified: Secondary | ICD-10-CM

## 2018-04-09 MED ORDER — HYDROCHLOROTHIAZIDE 25 MG PO TABS: 25.0000 mg | ORAL_TABLET | Freq: Every day | ORAL | 3 refills | Status: DC

## 2018-04-09 MED ORDER — SIMVASTATIN 20 MG PO TABS: 10.0000 mg | ORAL_TABLET | Freq: Every day | ORAL | 3 refills | Status: DC

## 2018-04-09 MED ORDER — AMLODIPINE BESYLATE 10 MG PO TABS: 10.0000 mg | ORAL_TABLET | Freq: Every day | ORAL | 3 refills | Status: DC

## 2018-04-09 NOTE — Progress Notes (Signed)
Med refills sent to mail order pharmacy

## 2018-04-09 NOTE — Telephone Encounter (Signed)
Please call patient and let her know that I sent her refills to her mail order pharmacy.

## 2018-04-09 NOTE — Telephone Encounter (Signed)
Pt called pharmacy and was told she did not have refills on the following. She is requesting refills sent to [facility]. [phone] Simvastatin 20mg - half a day Amlodipine 10mg  Hydrochlorothiazide 25mg 

## 2018-04-09 NOTE — Telephone Encounter (Signed)
Called and spoke with patient. Understanding verbalized nothing further needed at this time.

## 2018-05-22 ENCOUNTER — Encounter: Payer: Self-pay | Admitting: Family Medicine

## 2018-05-22 ENCOUNTER — Ambulatory Visit (INDEPENDENT_AMBULATORY_CARE_PROVIDER_SITE_OTHER): Payer: PPO | Admitting: Family Medicine

## 2018-05-22 VITALS — BP 120/78 | HR 83 | Temp 98.8°F | Wt 144.0 lb

## 2018-05-22 DIAGNOSIS — M353 Polymyalgia rheumatica: Secondary | ICD-10-CM | POA: Diagnosis not present

## 2018-05-22 DIAGNOSIS — M2550 Pain in unspecified joint: Secondary | ICD-10-CM | POA: Diagnosis not present

## 2018-05-22 DIAGNOSIS — M19019 Primary osteoarthritis, unspecified shoulder: Secondary | ICD-10-CM | POA: Diagnosis not present

## 2018-05-22 LAB — COMPREHENSIVE METABOLIC PANEL
ALT: 9 U/L (ref 0–35)
AST: 12 U/L (ref 0–37)
Albumin: 4 g/dL (ref 3.5–5.2)
Alkaline Phosphatase: 85 U/L (ref 39–117)
BUN: 11 mg/dL (ref 6–23)
CALCIUM: 10.1 mg/dL (ref 8.4–10.5)
CHLORIDE: 99 meq/L (ref 96–112)
CO2: 29 meq/L (ref 19–32)
CREATININE: 0.81 mg/dL (ref 0.40–1.20)
GFR: 73.71 mL/min (ref >60.00)
Glucose, Bld: 101 mg/dL — ABNORMAL HIGH (ref 70–99)
POTASSIUM: 4 meq/L (ref 3.5–5.1)
SODIUM: 139 meq/L (ref 135–145)
Total Bilirubin: 0.6 mg/dL (ref 0.2–1.2)
Total Protein: 7.6 g/dL (ref 6.0–8.3)

## 2018-05-22 LAB — CBC WITH DIFFERENTIAL/PLATELET
BASOS PCT: 0.9 % (ref 0.0–3.0)
Basophils Absolute: 0.1 10*3/uL (ref 0.0–0.1)
Eosinophils Absolute: 0.1 10*3/uL (ref 0.0–0.7)
Eosinophils Relative: 0.7 % (ref 0.0–5.0)
HCT: 37.7 % (ref 36.0–46.0)
Hemoglobin: 12.3 g/dL (ref 12.0–15.0)
Lymphocytes Relative: 8.2 % — ABNORMAL LOW (ref 12.0–46.0)
Lymphs Abs: 1 10*3/uL (ref 0.7–4.0)
MCHC: 32.5 g/dL (ref 30.0–36.0)
MCV: 90.7 fl (ref 78.0–100.0)
MONO ABS: 1 10*3/uL (ref 0.1–1.0)
Monocytes Relative: 8.2 % (ref 3.0–12.0)
NEUTROS ABS: 9.6 10*3/uL — AB (ref 1.4–7.7)
Neutrophils Relative %: 82 % — ABNORMAL HIGH (ref 43.0–77.0)
PLATELETS: 438 10*3/uL — AB (ref 150.0–400.0)
RBC: 4.16 Mil/uL (ref 3.87–5.11)
RDW: 13.5 % (ref 11.5–15.5)
WBC: 11.7 10*3/uL — ABNORMAL HIGH (ref 4.0–10.5)

## 2018-05-22 LAB — SEDIMENTATION RATE: Sed Rate: 66 mm/hr — ABNORMAL HIGH (ref 0–30)

## 2018-05-22 MED ORDER — TRAMADOL HCL 50 MG PO TABS: 50.0000 mg | ORAL_TABLET | Freq: Two times a day (BID) | ORAL | 0 refills | Status: DC | PRN

## 2018-05-22 MED ORDER — CYCLOBENZAPRINE HCL 10 MG PO TABS: 5.0000 mg | ORAL_TABLET | Freq: Every evening | ORAL | 0 refills | Status: DC | PRN

## 2018-05-22 NOTE — Patient Instructions (Signed)
Good to see you today, I am sorry you are feeling so badly!  Stop at the lab. I will notify you of results next week and we'll go from there.   I have sent in a muscle relaxer to your pharmacy to use at bedtime. Start with 1/2 tablet.   Continue extra strength Tylenol twice a day, heat (can use heating pad) and gentle stretching and movement as tolerated.

## 2018-05-22 NOTE — Progress Notes (Signed)
Subjective:    Patient ID: Heather Potter, female    DOB: 07-19-1945, 73 y.o.   MRN: [id number]  HPI This is a 73 yo female, accompanied by her husband, who presents today with worsening shoulder and hand pain. Has had some groin pain over last 2-3 weeks with radiation to legs. Pain is severe in the mornings. Taking a tramadol and gabapentin in the morning and gabapentin at bedtime. Disrupted sleep. This is unusual pain for her and had a sudden onset with no known injury, over use or pre ceeding symptoms (viral illness, etc). She is typically very active taking care of her sick MIL, maintaining her home and yard. Over the last couple of weeks has had difficulty getting out of the bed, can't raise arms over her head due to pain, has pain in inner thighs. No back or neck pain. No falls. Occasional nausea with pain. No vomiting. No weight loss. No headaches. Some relief with tramadol. Was trying to wean but has had to go back to twice a day.   Past Medical History:  Diagnosis Date  . Colitis   . History of blood transfusion    D and C and miscarriage  . History of gynecologic surgery    had right ovary removed, unsure about uterus/cervix  . Hyperlipidemia   . Hypertension   . Hypothyroidism   . Lichen planus 6/62/9476   Treated by Dr. Kellie Moor (derm) with methotrexate  . Osteoarthritis   . Osteopenia    Past Surgical History:  Procedure Laterality Date  . APPENDECTOMY    . [place]   colectomy for ? Diverticulits  . DILATION AND CURETTAGE OF UTERUS    . ESOPHAGEAL MANOMETRY N/A 08/21/2015   Procedure: ESOPHAGEAL MANOMETRY (EM);  Surgeon: Mauri Pole, MD;  Location: [facility];  Service: Endoscopy;  Laterality: N/A;  . HERNIA REPAIR     right lower abd  . 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 OOPHORECTOMY     right  . OTHER SURGICAL HISTORY     part of intestine removed  . TUBAL LIGATION     bilat   Family History  Problem Relation Age of Onset   . Pancreatic cancer Mother   . Heart attack Father   . Liver cancer Brother   . Colon cancer Neg Hx   . Breast cancer Neg Hx    Social History   Tobacco Use  . Smoking status: Former Smoker    Last attempt to quit: 05/21/2007    Years since quitting: 11.0  . Smokeless tobacco: Never Used  Substance Use Topics  . Alcohol use: Yes    Comment: rare- wine  . Drug use: No      Review of Systems Per HPI    Objective:   Physical Exam Constitutional:      Appearance: Normal appearance. She is normal weight. She is not ill-appearing.  HENT:     Head: Normocephalic.  Neck:     Musculoskeletal: Normal range of motion and neck supple. No neck rigidity or muscular tenderness.  Cardiovascular:     Rate and Rhythm: Normal rate.  Pulmonary:     Effort: Pulmonary effort is normal.  Musculoskeletal:        General: No swelling (no LE ).     Comments: Unable to lift bilateral arms above shoulder level. Tender over bilateral shoulder joints, some mild tenderness over biceps. No elbow tenderness. Chronically deformed hands.  Bilateral  hips with decreased external rotation, no pain over anterior/lateral hip or gluteus. Tender to palpation over inner, upper thighs.  Steady but stiff gait.   Skin:    General: Skin is warm and dry.  Neurological:     General: No focal deficit present.     Mental Status: She is alert and oriented to person, place, and time.  Psychiatric:        Mood and Affect: Mood normal.        Behavior: Behavior normal.        Thought Content: Thought content normal.        Judgment: Judgment normal.       BP 120/78   Pulse 83   Temp 98.8 F (37.1 C) (Oral)   Wt 144 lb (65.3 kg)   SpO2 97%   BMI 25.92 kg/m  Wt Readings from Last 3 Encounters:  05/22/18 144 lb (65.3 kg)  03/02/18 144 lb 1.9 oz (65.4 kg)  12/31/17 145 lb 12 oz (66.1 kg)       Assessment & Plan:  1. Arthralgia, unspecified joint - unclear etiology of recent poly arthralgias. Will check  labs for auto immune/RA - can take tramadol BID, can add acetaminophen, encouraged daily stretching and walking, heat prn - CBC with Differential - Comprehensive metabolic panel - Rheumatoid factor; Future - ANA - Sedimentation rate - Rheumatoid factor - cyclobenzaprine 10 mg, 1/2-1 tablet qhs prn- discussed potential side effects  2. Osteoarthritis of shoulder, unspecified laterality, unspecified osteoarthritis type - traMADol (ULTRAM) 50 MG tablet; Take 1 tablet (50 mg total) by mouth every 12 (twelve) hours as needed.  Dispense: 180 tablet; Refill: 0   Clarene Reamer, FNP-BC  [facility] at [facility], [facility]  05/24/2018 12:23 PM

## 2018-05-23 LAB — RHEUMATOID FACTOR

## 2018-05-24 ENCOUNTER — Encounter: Payer: Self-pay | Admitting: Family Medicine

## 2018-05-25 LAB — ANA: Anti Nuclear Antibody(ANA): NEGATIVE

## 2018-05-26 ENCOUNTER — Encounter: Payer: Self-pay | Admitting: Family Medicine

## 2018-05-26 DIAGNOSIS — M353 Polymyalgia rheumatica: Secondary | ICD-10-CM | POA: Insufficient documentation

## 2018-05-26 MED ORDER — PREDNISONE 5 MG PO TABS: ORAL_TABLET | ORAL | 0 refills | Status: DC

## 2018-05-26 NOTE — Addendum Note (Signed)
Addended by: Clarene Reamer B on: 05/26/2018 10:51 AM   Modules accepted: Orders

## 2018-06-01 ENCOUNTER — Encounter: Payer: Self-pay | Admitting: Family Medicine

## 2018-06-01 ENCOUNTER — Ambulatory Visit (INDEPENDENT_AMBULATORY_CARE_PROVIDER_SITE_OTHER): Payer: PPO | Admitting: Family Medicine

## 2018-06-01 VITALS — BP 116/64 | HR 72 | Temp 98.1°F | Ht 62.5 in | Wt 142.5 lb

## 2018-06-01 DIAGNOSIS — M353 Polymyalgia rheumatica: Secondary | ICD-10-CM | POA: Diagnosis not present

## 2018-06-01 LAB — HIGH SENSITIVITY CRP: CRP HIGH SENSITIVITY: 19.98 mg/L — AB (ref 0.000–5.000)

## 2018-06-01 LAB — SEDIMENTATION RATE: Sed Rate: 41 mm/hr — ABNORMAL HIGH (ref 0–30)

## 2018-06-01 NOTE — Patient Instructions (Signed)
Good to see you today  Please follow up in 5-6 weeks,   Stop at the lab  If you have any problems please contact the office

## 2018-06-01 NOTE — Progress Notes (Signed)
Subjective:    Patient ID: Heather Potter, female    DOB: 1946-02-13, 73 y.o.   MRN: [id number]  HPI This is a 73 yo female who presents today for follow up of recently diagnosed PMR.  She started prednisone 7 days ago. Is on last day of 15 mg, will go to 10 mg for 7 days starting tomorrow.  Sleeping well. No side effects from medication. She has significantly improved range of motion of shoulders, she is now able to easily lift above her head. No more shoulder or hip pain. Intermittent twinges of inner, upper leg pain.    Past Medical History:  Diagnosis Date  . Colitis   . History of blood transfusion    D and C and miscarriage  . History of gynecologic surgery    had right ovary removed, unsure about uterus/cervix  . Hyperlipidemia   . Hypertension   . Hypothyroidism   . Lichen planus 12/31/4816   Treated by Dr. Kellie Moor (derm) with methotrexate  . Osteoarthritis   . Osteopenia    Past Surgical History:  Procedure Laterality Date  . APPENDECTOMY    . [place]   colectomy for ? Diverticulits  . DILATION AND CURETTAGE OF UTERUS    . ESOPHAGEAL MANOMETRY N/A 08/21/2015   Procedure: ESOPHAGEAL MANOMETRY (EM);  Surgeon: Mauri Pole, MD;  Location: [facility];  Service: Endoscopy;  Laterality: N/A;  . HERNIA REPAIR     right lower abd  . 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 OOPHORECTOMY     right  . OTHER SURGICAL HISTORY     part of intestine removed  . TUBAL LIGATION     bilat   Family History  Problem Relation Age of Onset  . Pancreatic cancer Mother   . Heart attack Father   . Liver cancer Brother   . Colon cancer Neg Hx   . Breast cancer Neg Hx    Social History   Socioeconomic History  . Marital status: Married    Spouse name: Not on file  . Number of children: 1  . Years of education: Not on file  . Highest education level: Not on file  Occupational History  . Occupation: Agricultural engineer  Social Needs  . Financial resource  strain: Not on file  . Food insecurity:    Worry: Not on file    Inability: Not on file  . Transportation needs:    Medical: Not on file    Non-medical: Not on file  Tobacco Use  . Smoking status: Former Smoker    Last attempt to quit: 05/21/2007    Years since quitting: 11.0  . Smokeless tobacco: Never Used  Substance and Sexual Activity  . Alcohol use: Yes    Comment: rare- wine  . Drug use: No  . Sexual activity: Not on file  Lifestyle  . Physical activity:    Days per week: Not on file    Minutes per session: Not on file  . Stress: Not on file  Relationships  . Social connections:    Talks on phone: Not on file    Gets together: Not on file    Attends religious service: Not on file    Active member of club or organization: Not on file    Attends meetings of clubs or organizations: Not on file    Relationship status: Not on file  . Intimate partner violence:    Fear of current or  ex partner: Not on file    Emotionally abused: Not on file    Physically abused: Not on file    Forced sexual activity: Not on file  Other Topics Concern  . Not on file  Social History Narrative   One adopted son   Regular exercise: yes      Does not have a living will or HPOA.   Desires CPR and life support for only short period if not futile.            Review of Systems Per HPI    Objective:   Physical Exam Vitals signs reviewed.  Constitutional:      General: She is not in acute distress.    Appearance: Normal appearance. She is normal weight. She is not ill-appearing or toxic-appearing.  HENT:     Head: Normocephalic and atraumatic.  Cardiovascular:     Rate and Rhythm: Normal rate.     Pulses: Normal pulses.  Pulmonary:     Effort: Pulmonary effort is normal.  Musculoskeletal:        General: No swelling.     Comments: Full ROM of UE.  UE/LE strength 5/5 Normal gait  Skin:    General: Skin is warm and dry.  Neurological:     Mental Status: She is alert and  oriented to person, place, and time.  Psychiatric:        Mood and Affect: Mood normal.        Behavior: Behavior normal.        Thought Content: Thought content normal.        Judgment: Judgment normal.       BP 116/64   Pulse 72   Temp 98.1 F (36.7 C) (Oral)   Ht 5' 2.5" (1.588 m)   Wt 142 lb 8 oz (64.6 kg)   SpO2 95%   BMI 25.65 kg/m  Wt Readings from Last 3 Encounters:  06/01/18 142 lb 8 oz (64.6 kg)  05/22/18 144 lb (65.3 kg)  03/02/18 144 lb 1.9 oz (65.4 kg)       Assessment & Plan:  1. PMR (polymyalgia rheumatica) (HCC) - significantly improved with prednisone, continue to taper - discussed diagnosis and answered patient's questions - follow up in 5-6 weeks to check symptoms, medication side effects, discuss tapering - Sedimentation rate - High sensitivity CRP   Clarene Reamer, FNP-BC   Primary Care at [facility], [place] [facility]  06/01/2018 11:27 AM

## 2018-06-03 ENCOUNTER — Telehealth: Payer: Self-pay

## 2018-06-03 DIAGNOSIS — M19019 Primary osteoarthritis, unspecified shoulder: Secondary | ICD-10-CM

## 2018-06-03 DIAGNOSIS — M353 Polymyalgia rheumatica: Secondary | ICD-10-CM

## 2018-06-03 NOTE — Telephone Encounter (Signed)
Called and spoke with patient. She decreased prednisone yesterday, 15 mg to 10 mg. Has had two days of 10 mg. She will continue on this dose, move dosing to earlier in day gradually. Follow up phone call in 48 hours to see how she is doing, may need to increase prednisone.

## 2018-06-03 NOTE — Telephone Encounter (Signed)
Pt said both legs are hurting in groin to knee and  Both elbows to shoulders. Pain level now is 9.5; pt having a hard time lifting or gripping anything and cannot bend over. Pt said symptoms were gone until late yesterday and continuing today. When trying to get out of bed this morning pt leaned to the right and hit head on wall with no apparent injury. Pt took tramadol and gabapentin this morning with no real relief of pain. Pt is to  Take her prednisone now at 4:30. Pt was just seen 06/01/18. Pt request what to do. [facility] [address]

## 2018-06-05 NOTE — Telephone Encounter (Signed)
Patient has felt better over last couple of days, better today than yesterday. Suspect related to decreasing prednisone to 10 from 15. She will continue on 10 mg dose for full week then decrease to 7.5. If increased pain lasting more than a couple of days, can go back up to 10 mg. She will call if any problems.

## 2018-06-09 MED ORDER — PREDNISONE 5 MG PO TABS: ORAL_TABLET | ORAL | 0 refills | Status: DC

## 2018-06-09 NOTE — Addendum Note (Signed)
Addended by: Pleas Koch on: 06/09/2018 05:03 PM   Modules accepted: Orders

## 2018-06-09 NOTE — Telephone Encounter (Signed)
Please have patient increase back up to three tablets for an additional 7 days, then reduce back down to two tablets. I will send a refill to her pharmacy now. Have her call us with an update if any problems.

## 2018-06-09 NOTE — Telephone Encounter (Signed)
Spoke to pt who states she was to contact office with update. She states when she reduced prednisone to 2tabs daily, starting last Tuesday, there was no effectiveness. Today, she was to reduce to 1.5 tabs, but since 2tabs was ineffective, she did not proceed with reducing to 1.5tabs. Pt states she has increased back to 3tabs per day and may need a refill to [place]. She is wanting to know if she should continue taking 7days.  pls advise

## 2018-06-15 ENCOUNTER — Other Ambulatory Visit: Payer: Self-pay | Admitting: Family Medicine

## 2018-06-15 DIAGNOSIS — M19019 Primary osteoarthritis, unspecified shoulder: Secondary | ICD-10-CM

## 2018-06-15 DIAGNOSIS — M19041 Primary osteoarthritis, right hand: Secondary | ICD-10-CM

## 2018-06-15 DIAGNOSIS — M19042 Primary osteoarthritis, left hand: Secondary | ICD-10-CM

## 2018-06-15 MED ORDER — CYCLOBENZAPRINE HCL 10 MG PO TABS: 5.0000 mg | ORAL_TABLET | Freq: Every evening | ORAL | 0 refills | Status: DC | PRN

## 2018-06-15 NOTE — Telephone Encounter (Signed)
Patient called stating that she needs a refill on her Gabapentin and she is taking two a day. Patient stated that she called last week and never heard anything back. Advised patient of Tawni Millers message and she verbalized understanding. Patient stated that she has been taking 3 Prednisone for the last 7 days and will pick up the script today. Patient stated that she is going to try to cut back to 2 and 1/2 of the Prednisone tomorrow.  . Patent stated that she has been taking Cyclobenzaprine at bedtime and if she is to continue that she will need a refill.  Last office visit 06/01/18 Cyclobenzaprine last refill 05/22/18 #30 Gabapentin last refill 12/31/17 #60/4 [place]

## 2018-06-15 NOTE — Telephone Encounter (Signed)
Spoken and notified patient of Kate Clark's comments. Patient verbalized understanding.  

## 2018-06-15 NOTE — Telephone Encounter (Signed)
Message left for patient to return my call.  

## 2018-06-15 NOTE — Addendum Note (Signed)
Addended by: Pleas Koch on: 06/15/2018 10:59 AM   Modules accepted: Orders

## 2018-06-15 NOTE — Telephone Encounter (Addendum)
Noted, will refill both gabapentin and cyclobenzaprine. Please notify patient to try to use gabapentin without cyclobenzaprine at night if possible. This is to see if she even needs cyclobenzaprine as this is a lot of potentially sedative medication. Will you forward me the refill request for gabapentin? It looks like it's pending but has been sent. Thanks.

## 2018-06-17 ENCOUNTER — Telehealth: Payer: Self-pay

## 2018-06-17 NOTE — Telephone Encounter (Signed)
Received fax from [facility] stating that patient just received her last 90 day supply for levothyroxine 75 mg and asking for a new RX to be sent. Last refill was sent per our records on 11/14/2017 for 90 days with 3 refills. Patient should still have plenty of refills. Patient does have an appointment for follow up on 07/13/2018 and a physical in April 2020 and refill can be addressed at that time if TSH levels are still normal.

## 2018-06-19 ENCOUNTER — Other Ambulatory Visit: Payer: Self-pay | Admitting: Family Medicine

## 2018-06-19 ENCOUNTER — Telehealth: Payer: Self-pay | Admitting: *Deleted

## 2018-06-19 DIAGNOSIS — M353 Polymyalgia rheumatica: Secondary | ICD-10-CM

## 2018-06-19 MED ORDER — PREDNISONE 5 MG PO TABS: 15.0000 mg | ORAL_TABLET | Freq: Every day | ORAL | 2 refills | Status: DC

## 2018-06-19 NOTE — Telephone Encounter (Signed)
Patient has required titration of prednisone for PMR. I will send in new prescription with different instructions for refill.

## 2018-06-19 NOTE — Telephone Encounter (Signed)
Noted. Follow up on file for 3 weeks.

## 2018-06-19 NOTE — Telephone Encounter (Signed)
Patient calls back to the triage line to have office call her insurance to get them to approve the refill on her prednisone. Patient is saying that insurance is refusing to pay for her prednisone stating that the refill is too early.   I confirmed with the pharmacy that they received initial RX of #90 pills on 05/26/18. And refill given by K. Carlis Abbott, NP for prednisone 5mg  #90 (same directions) on 06/09/18.  Insurance will not pay for the script sent in on 1/21 until later in February as per directions this is too early.  I am confused as to why a 2nd prescription was given and if patient is taking too much? Or has she been instructed to take differently from directions??   If patient should be out of medication by now, we will have to change directions to show the increase in dosing in order to get insurance to approve.  Glenda Chroman, NP can you please clarify how patient should be taking and if she should have gotten a refill?  If followed dosing instructions, she should still have several weeks worth of medication.    If she is to get the refill as written, patient out of pocket cost will only be $20.00.    Please advise how to proceed.

## 2018-06-19 NOTE — Telephone Encounter (Signed)
Patient stated that she was to call back with an update after she made the change on her Prednisone. Patient called to advise that she went down to Prednisone 2 and 1/2 a day and it is working okay, but not great. Patient stated that she is not doing very good when she gets up in the morning, but after a while she is okay.

## 2018-06-19 NOTE — Telephone Encounter (Signed)
Please call her and let her know that I have sent in a new prescription with a refill. She is to follow verbal dosing instructions while weaning.

## 2018-06-22 NOTE — Telephone Encounter (Signed)
Left message on voicemail for patient to call back.  Per Tor Netters NP patient is to take 2 and 1/2 of her Prednisone daily. Script was sent in for 3 daily to make sure that she does not run out in case to has to go back up to that dose. Verify how patient is doing on the 2 and 1/2 daily. Patient has an upcoming appointment scheduled with Debbie 07/13/18.

## 2018-06-22 NOTE — Telephone Encounter (Signed)
Patient notified as instructed by telephone and verbalized understanding. Patient stated that she did much better with the second round of the 2 and 1/2 pills daily. Patient stated that she will  drop down to 2 pills a day starting tomorrow per instructions. Patient stated that she will let Tor Netters NP know if she has any problems with the 2 pills a day. Patient aware of her appointment on 07/13/18.

## 2018-06-22 NOTE — Telephone Encounter (Signed)
Noted  

## 2018-07-13 ENCOUNTER — Ambulatory Visit (INDEPENDENT_AMBULATORY_CARE_PROVIDER_SITE_OTHER): Payer: PPO | Admitting: Family Medicine

## 2018-07-13 ENCOUNTER — Encounter: Payer: Self-pay | Admitting: Family Medicine

## 2018-07-13 VITALS — BP 116/60 | HR 77 | Temp 98.1°F | Resp 16 | Ht 62.5 in | Wt 145.0 lb

## 2018-07-13 DIAGNOSIS — M353 Polymyalgia rheumatica: Secondary | ICD-10-CM | POA: Diagnosis not present

## 2018-07-13 NOTE — Progress Notes (Signed)
Subjective:    Patient ID: Heather Potter, female    DOB: 1945/06/15, 73 y.o.   MRN: [id number]  HPI This is a 73 yo female who presents today for follow up of PMR. She has decreased her prednisone to 5 mg. She continues to have hip pain, decreased ROM and shoulder pain, sleeping in recliner. She has decreased her prednisone on her own and is currently taking 5 mg- she decreased from 15 to 12.5, 10 to 7.5 to 5. She decreased by 1/2 every week, thinking this is what she was supposed to do. She did very well on 15 mg, was decreased to 12.5 and was instructed to stop at 10 mg.  She denies any side effects of prednisone- stomach upset/nausea, insomnia, weight gain.  Her dog has been very ill and in and out of the vet's office.    Past Medical History:  Diagnosis Date  . Colitis   . History of blood transfusion    D and C and miscarriage  . History of gynecologic surgery    had right ovary removed, unsure about uterus/cervix  . Hyperlipidemia   . Hypertension   . Hypothyroidism   . Lichen planus 7/00/1749   Treated by Dr. Kellie Moor (derm) with methotrexate  . Osteoarthritis   . Osteopenia    Past Surgical History:  Procedure Laterality Date  . APPENDECTOMY    . Ward   colectomy for ? Diverticulits  . DILATION AND CURETTAGE OF UTERUS    . ESOPHAGEAL MANOMETRY N/A 08/21/2015   Procedure: ESOPHAGEAL MANOMETRY (EM);  Surgeon: Mauri Pole, MD;  Location: [facility];  Service: Endoscopy;  Laterality: N/A;  . HERNIA REPAIR     right lower abd  . 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 OOPHORECTOMY     right  . OTHER SURGICAL HISTORY     part of intestine removed  . TUBAL LIGATION     bilat   Family History  Problem Relation Age of Onset  . Pancreatic cancer Mother   . Heart attack Father   . Liver cancer Brother   . Colon cancer Neg Hx   . Breast cancer Neg Hx    Social History   Tobacco Use  . Smoking status: Former Smoker    Last  attempt to quit: 05/21/2007    Years since quitting: 11.1  . Smokeless tobacco: Never Used  Substance Use Topics  . Alcohol use: Yes    Comment: rare- wine  . Drug use: No      Review of Systems Per HPI    Objective:   Physical Exam Vitals signs reviewed.  Constitutional:      General: She is not in acute distress.    Appearance: Normal appearance. She is normal weight. She is not ill-appearing, toxic-appearing or diaphoretic.  HENT:     Head: Normocephalic and atraumatic.  Cardiovascular:     Rate and Rhythm: Normal rate.  Pulmonary:     Effort: Pulmonary effort is normal.  Musculoskeletal:     Comments: Decreased ROM of shoulders, unable to lift arms above shoulders. Stiff gait when starting.   Skin:    General: Skin is warm and dry.  Neurological:     Mental Status: She is alert and oriented to person, place, and time.  Psychiatric:        Mood and Affect: Mood normal.        Behavior: Behavior normal.  Thought Content: Thought content normal.        Judgment: Judgment normal.       BP 116/60 (BP Location: Right Arm, Patient Position: Sitting)   Pulse 77   Temp 98.1 F (36.7 C) (Oral)   Resp 16   Ht 5' 2.5" (1.588 m)   Wt 145 lb (65.8 kg)   SpO2 96%   BMI 26.10 kg/m  Wt Readings from Last 3 Encounters:  07/13/18 145 lb (65.8 kg)  06/01/18 142 lb 8 oz (64.6 kg)  05/22/18 144 lb (65.3 kg)       Assessment & Plan:  1. PMR (polymyalgia rheumatica) (HCC) - patient unfortunately confused about prednisone. Reviewed with her nessitiy of very slow wean and likelihood of being on prednisone for at least 6 months - increase prednisone to 10 mg daily, follow up in 3 weeks, she was instructed to contact me if she does not get relief of symptoms with increase    Clarene Reamer, FNP-BC   Primary [facility] at [facility], [place]  07/13/2018 9:57 AM

## 2018-07-13 NOTE — Patient Instructions (Signed)
Go back to 10 mg a day of prednisone a day then follow up in 3 weeks

## 2018-07-24 ENCOUNTER — Other Ambulatory Visit: Payer: Self-pay | Admitting: Primary Care

## 2018-07-24 DIAGNOSIS — M19041 Primary osteoarthritis, right hand: Secondary | ICD-10-CM

## 2018-07-24 DIAGNOSIS — M19042 Primary osteoarthritis, left hand: Secondary | ICD-10-CM

## 2018-07-24 DIAGNOSIS — M19019 Primary osteoarthritis, unspecified shoulder: Secondary | ICD-10-CM

## 2018-07-27 ENCOUNTER — Telehealth: Payer: Self-pay | Admitting: *Deleted

## 2018-07-27 NOTE — Telephone Encounter (Signed)
Please call patient and tell her to go back to 2 1/2 tabs until she comes back in.

## 2018-07-27 NOTE — Telephone Encounter (Signed)
Patient left a voicemail stating that she has been taking two Prednisone a day. Patient wants to know if you will okay her to go back up to 2 and 1/2 a day?  Patient stated that she is having a lot of hip and shoulder pain. Patient stated that she has an appointment scheduled with you on 08/05/18 and does not think she can wait until that time to see about the medication increase.

## 2018-07-27 NOTE — Telephone Encounter (Signed)
Called patient at [phone] (home) and she is aware of message below and will be at her appointment on 08/05/2018 to discuss further

## 2018-08-03 ENCOUNTER — Ambulatory Visit: Payer: PPO | Admitting: Family Medicine

## 2018-08-04 ENCOUNTER — Telehealth: Payer: Self-pay

## 2018-08-04 ENCOUNTER — Other Ambulatory Visit: Payer: Self-pay | Admitting: Family Medicine

## 2018-08-04 DIAGNOSIS — M353 Polymyalgia rheumatica: Secondary | ICD-10-CM

## 2018-08-04 MED ORDER — PREDNISONE 5 MG PO TABS: 15.0000 mg | ORAL_TABLET | Freq: Every day | ORAL | 2 refills | Status: DC

## 2018-08-04 NOTE — Telephone Encounter (Signed)
Called and spoke with patient regarding her appointment tomorrow. She has been doing well on current dose of prednisone for her PMR. She is taking 12.5 mg daily. She has no questions or concerns. Due to COVID-19 and patient's age and on steroids, will keep her out of the office as much as possible. She will update me in 3 weeks on how she is doing and we can discuss weaning prednisone. I will send in refill of prednisone to her pharmacy.

## 2018-08-04 NOTE — Telephone Encounter (Signed)
I spoke with pt and advised her of the precautions we are taking at the office- separating well and sick patients during the day, deep cleaning and screening patients on the phone. She agreed to come in as advised 3/18 at 9:30 am for follow up.

## 2018-08-04 NOTE — Telephone Encounter (Signed)
[place] Night - Client Nonclinical Telephone Record [facility] Night - Client Client Site [place] - Night Physician Tor Netters- NP Contact Type Call Who Is Calling Patient / Member / Family / Caregiver Caller Name Benicia Bergevin Caller Phone Number [phone] Patient Name Heather Potter Patient DOB 19-Aug-1945 Call Type Message Only Information Provided Reason for Call Medication Question / Request Initial Comment Caller states she has an appt tomorrow, afraid to come in for mother-in-law, and wanting to reschedule her appt. Asking for MD to call her back over special medications refilled: Prednasone 5 mg, 2.5 per day, before being seen again. Additional Comment Provided information for a call back from the office. Call Closed By: Rosana Fret Transaction Date/Time: 08/04/2018 7:26:40 AM (ET)

## 2018-08-05 ENCOUNTER — Ambulatory Visit: Payer: PPO | Admitting: Family Medicine

## 2018-08-20 ENCOUNTER — Other Ambulatory Visit: Payer: Self-pay | Admitting: Family Medicine

## 2018-08-20 DIAGNOSIS — M19019 Primary osteoarthritis, unspecified shoulder: Secondary | ICD-10-CM

## 2018-08-20 DIAGNOSIS — M19041 Primary osteoarthritis, right hand: Secondary | ICD-10-CM

## 2018-08-20 DIAGNOSIS — M19042 Primary osteoarthritis, left hand: Secondary | ICD-10-CM

## 2018-09-01 ENCOUNTER — Telehealth: Payer: Self-pay

## 2018-09-01 NOTE — Telephone Encounter (Signed)
Outbound call to patient  - PCP instructions and appointment  Provided information to patient per PCP instructions regarding Prednisone and Tramadol. Patient verbalized understanding.  Scheduled virtual video appointment with PCP on 09/02/2018 @ 0830.  Pharmacy is [facility].  NOTE: pt wants to discuss Tramadol with PCP prior to refill.

## 2018-09-01 NOTE — Telephone Encounter (Signed)
Inbound call to triage - medication management  Prednisone - should she decrease from 2.5 tablets to 2 tablets?  Note: she has 46 tablets remaining  Tramadol - should she decrease dosage or remain the same?  Note: if she remains on Tramadol, a new prescription is requested

## 2018-09-01 NOTE — Telephone Encounter (Signed)
Please call patient and tell her to stay on 2 1/2 tablets of prednisone 5 mg for 2 more weeks. Schedule a virtual visit to see how she is doing. She should only take the tramadol as needed for moderate to severe pain. How often does she require it for pain? I can send in a refill. Please verify pharmacy.

## 2018-09-02 ENCOUNTER — Encounter: Payer: Self-pay | Admitting: Family Medicine

## 2018-09-02 ENCOUNTER — Ambulatory Visit (INDEPENDENT_AMBULATORY_CARE_PROVIDER_SITE_OTHER): Payer: PPO | Admitting: Family Medicine

## 2018-09-02 VITALS — Ht 62.5 in | Wt 143.0 lb

## 2018-09-02 DIAGNOSIS — M353 Polymyalgia rheumatica: Secondary | ICD-10-CM

## 2018-09-02 DIAGNOSIS — D473 Essential (hemorrhagic) thrombocythemia: Secondary | ICD-10-CM | POA: Diagnosis not present

## 2018-09-02 DIAGNOSIS — Z7952 Long term (current) use of systemic steroids: Secondary | ICD-10-CM | POA: Diagnosis not present

## 2018-09-02 DIAGNOSIS — M19019 Primary osteoarthritis, unspecified shoulder: Secondary | ICD-10-CM | POA: Diagnosis not present

## 2018-09-02 DIAGNOSIS — D75839 Thrombocytosis, unspecified: Secondary | ICD-10-CM

## 2018-09-02 MED ORDER — PREDNISONE 1 MG PO TABS: 2.0000 mg | ORAL_TABLET | Freq: Every day | ORAL | 1 refills | Status: DC

## 2018-09-02 MED ORDER — TRAMADOL HCL 50 MG PO TABS: 50.0000 mg | ORAL_TABLET | Freq: Two times a day (BID) | ORAL | 0 refills | Status: DC | PRN

## 2018-09-02 MED ORDER — PREDNISONE 5 MG PO TABS: 10.0000 mg | ORAL_TABLET | Freq: Every day | ORAL | 1 refills | Status: DC

## 2018-09-02 NOTE — Telephone Encounter (Signed)
Noted  

## 2018-09-02 NOTE — Progress Notes (Signed)
Virtual Visit via Video Note  I connected with Heather Potter on 09/02/18 at  8:30 AM EDT by a video enabled telemedicine application and verified that I am speaking with the correct person using two identifiers.  The patient was in her home for the visit and I was in my office.  The patient and I were initially able to see each other through the video link but unfortunately she was not able to hear me and in order to complete the visit we did audio via telephone.   I discussed the limitations of evaluation and management by telemedicine and the availability of in person appointments. The patient expressed understanding and agreed to proceed.  History of Present Illness: This is a 73 year old female who agrees to virtual visit to discuss her pain.  It is not advisable to bring her into the office at this time due to COVID-19 pandemic. She has a history of osteoarthritis as well as PMR.  She is currently taking 12.5 mg of prednisone daily.  She reports that other than some morning pain and stiffness she feels that her pain is well controlled.  She has been taking tramadol 50 mg twice daily.  She also takes gabapentin 100 mg at bedtime.  She would like to back off of her tramadol and try to take it once a day in the morning and not take her evening dose.  She feels that her overall osteoarthritic symptoms are improved as well as her PMR shoulder and hip pain.  She has been stretching daily and feels that she has good range of motion.  She denies any side effects from prednisone.     Observations/Objective: The patient was alert and answers questions appropriately.  She was in no distress.  She was normally conversive without shortness of breath.  Head was normocephalic.  Mood and affect were appropriate.  Assessment and Plan: 1. PMR (polymyalgia rheumatica) (HCC) -Symptoms well controlled except for morning pain and stiffness.  We will continue 12.5 mg prednisone dose for 2 more weeks and then will  have her decrease to 11 mg for 8 weeks. -Labs this week -Follow-up in 2 months, sooner if problems or questions arise - predniSONE (DELTASONE) 5 MG tablet; Take 2 tablets (10 mg total) by mouth daily with breakfast. Will be taking along with 1 mg tablets for titration.  Dispense: 180 tablet; Refill: 1 - predniSONE (DELTASONE) 1 MG tablet; Take 2 tablets (2 mg total) by mouth daily with breakfast. Will be taking with 5 mg tablets for titration.  Dispense: 180 tablet; Refill: 1 - High sensitivity CRP; Future - Sedimentation rate; Future  2. Osteoarthritis of shoulder, unspecified laterality, unspecified osteoarthritis type -Stable, she will try to decrease tramadol if possible - traMADol (ULTRAM) 50 MG tablet; Take 1 tablet (50 mg total) by mouth every 12 (twelve) hours as needed.  Dispense: 180 tablet; Refill: 0  3. On prednisone therapy - Basic Metabolic Panel; Future  4. Thrombocytosis ([place]) - CBC with Differential; Future   Clarene Reamer, FNP-BC  [place] [facility]  09/02/2018 9:08 AM   Follow Up Instructions: The patient was sent to my chart message summarizing our virtual visit and providing written instructions for medications and follow-up.   I discussed the assessment and treatment plan with the patient. The patient was provided an opportunity to ask questions and all were answered. The patient agreed with the plan and demonstrated an understanding of the instructions.   The  patient was advised to call back or seek an in-person evaluation if the symptoms worsen or if the condition fails to improve as anticipated.  I provided 18 minutes of non-face-to-face time during this encounter.   Elby Beck, FNP

## 2018-09-07 ENCOUNTER — Ambulatory Visit (INDEPENDENT_AMBULATORY_CARE_PROVIDER_SITE_OTHER): Payer: PPO

## 2018-09-07 ENCOUNTER — Other Ambulatory Visit (INDEPENDENT_AMBULATORY_CARE_PROVIDER_SITE_OTHER): Payer: PPO

## 2018-09-07 ENCOUNTER — Other Ambulatory Visit: Payer: Self-pay

## 2018-09-07 DIAGNOSIS — Z0000 Encounter for general adult medical examination without abnormal findings: Secondary | ICD-10-CM | POA: Diagnosis not present

## 2018-09-07 DIAGNOSIS — M353 Polymyalgia rheumatica: Secondary | ICD-10-CM | POA: Diagnosis not present

## 2018-09-07 DIAGNOSIS — D473 Essential (hemorrhagic) thrombocythemia: Secondary | ICD-10-CM

## 2018-09-07 DIAGNOSIS — D75839 Thrombocytosis, unspecified: Secondary | ICD-10-CM

## 2018-09-07 DIAGNOSIS — Z7952 Long term (current) use of systemic steroids: Secondary | ICD-10-CM

## 2018-09-07 LAB — BASIC METABOLIC PANEL
BUN: 16 mg/dL (ref 6–23)
CO2: 29 mEq/L (ref 19–32)
Calcium: 10.4 mg/dL (ref 8.4–10.5)
Chloride: 101 mEq/L (ref 96–112)
Creatinine, Ser: 1.05 mg/dL (ref 0.40–1.20)
GFR: 51.36 mL/min — ABNORMAL LOW (ref >60.00)
Glucose, Bld: 126 mg/dL — ABNORMAL HIGH (ref 70–99)
Potassium: 3.9 mEq/L (ref 3.5–5.1)
Sodium: 139 mEq/L (ref 135–145)

## 2018-09-07 LAB — CBC WITH DIFFERENTIAL/PLATELET
Basophils Absolute: 0.1 10*3/uL (ref 0.0–0.1)
Basophils Relative: 0.9 % (ref 0.0–3.0)
Eosinophils Absolute: 0 10*3/uL (ref 0.0–0.7)
Eosinophils Relative: 0.2 % (ref 0.0–5.0)
HCT: 39.4 % (ref 36.0–46.0)
Hemoglobin: 12.7 g/dL (ref 12.0–15.0)
Lymphocytes Relative: 11.5 % — ABNORMAL LOW (ref 12.0–46.0)
Lymphs Abs: 1.3 10*3/uL (ref 0.7–4.0)
MCHC: 32.2 g/dL (ref 30.0–36.0)
MCV: 84.7 fl (ref 78.0–100.0)
Monocytes Absolute: 0.4 10*3/uL (ref 0.1–1.0)
Monocytes Relative: 3.4 % (ref 3.0–12.0)
Neutro Abs: 9.2 10*3/uL — ABNORMAL HIGH (ref 1.4–7.7)
Neutrophils Relative %: 84 % — ABNORMAL HIGH (ref 43.0–77.0)
Platelets: 260 10*3/uL (ref 150.0–400.0)
RBC: 4.65 Mil/uL (ref 3.87–5.11)
RDW: 17.6 % — ABNORMAL HIGH (ref 11.5–15.5)
WBC: 10.9 10*3/uL — ABNORMAL HIGH (ref 4.0–10.5)

## 2018-09-07 LAB — SEDIMENTATION RATE: Sed Rate: 13 mm/hr (ref 0–30)

## 2018-09-07 LAB — HIGH SENSITIVITY CRP: CRP, High Sensitivity: 5.09 mg/L — ABNORMAL HIGH (ref 0.000–5.000)

## 2018-09-07 NOTE — Patient Instructions (Signed)
Heather Potter , Thank you for taking time to come for your Medicare Wellness Visit. I appreciate your ongoing commitment to your health goals. Please review the following plan we discussed and let me know if I can assist you in the future.   These are the goals we discussed: Goals    . Follow up with Primary Care Provider     Starting 09/07/2018, I will continue to take medications as prescribed and to keep appointments with PCP as scheduled.        This is a list of the screening recommended for you and due dates:  Health Maintenance  Topic Date Due  . Flu Shot  12/19/2018  . Mammogram  10/30/2019  . Tetanus Vaccine  11/27/2020  . Colon Cancer Screening  12/18/2020  . DEXA scan (bone density measurement)  Completed  .  Hepatitis C: One time screening is recommended by Center for Disease Control  (CDC) for  adults born from [birth year] through 1965.   Completed  . Pneumonia vaccines  Completed   Preventive Care for Adults  A healthy lifestyle and preventive care can promote health and wellness. Preventive health guidelines for adults include the following key practices.  . A routine yearly physical is a good way to check with your health care provider about your health and preventive screening. It is a chance to share any concerns and updates on your health and to receive a thorough exam.  . Visit your dentist for a routine exam and preventive care every 6 months. Brush your teeth twice a day and floss once a day. Good oral hygiene prevents tooth decay and gum disease.  . The frequency of eye exams is based on your age, health, family medical history, use  of contact lenses, and other factors. Follow your health care provider's recommendations for frequency of eye exams.  . Eat a healthy diet. Foods like vegetables, fruits, whole grains, low-fat dairy products, and lean protein foods contain the nutrients you need without too many calories. Decrease your intake of foods high in solid fats,  added sugars, and salt. Eat the right amount of calories for you. Get information about a proper diet from your health care provider, if necessary.  . Regular physical exercise is one of the most important things you can do for your health. Most adults should get at least 150 minutes of moderate-intensity exercise (any activity that increases your heart rate and causes you to sweat) each week. In addition, most adults need muscle-strengthening exercises on 2 or more days a week.  Silver Sneakers may be a benefit available to you. To determine eligibility, you may visit the website: www.silversneakers.com or contact program at 517-259-2902 Mon-Fri between 8AM-8PM.   . Maintain a healthy weight. The body mass index (BMI) is a screening tool to identify possible weight problems. It provides an estimate of body fat based on height and weight. Your health care provider can find your BMI and can help you achieve or maintain a healthy weight.   For adults 20 years and older: ? A BMI below 18.5 is considered underweight. ? A BMI of 18.5 to 24.9 is normal. ? A BMI of 25 to 29.9 is considered overweight. ? A BMI of 30 and above is considered obese.   . Maintain normal blood lipids and cholesterol levels by exercising and minimizing your intake of saturated fat. Eat a balanced diet with plenty of fruit and vegetables. Blood tests for lipids and cholesterol should begin at age  20 and be repeated every 5 years. If your lipid or cholesterol levels are high, you are over 50, or you are at high risk for heart disease, you may need your cholesterol levels checked more frequently. Ongoing high lipid and cholesterol levels should be treated with medicines if diet and exercise are not working.  . If you smoke, find out from your health care provider how to quit. If you do not use tobacco, please do not start.  . If you choose to drink alcohol, please do not consume more than 2 drinks per day. One drink is  considered to be 12 ounces (355 mL) of beer, 5 ounces (148 mL) of wine, or 1.5 ounces (44 mL) of liquor.  . If you are 58-33 years old, ask your health care provider if you should take aspirin to prevent strokes.  . Use sunscreen. Apply sunscreen liberally and repeatedly throughout the day. You should seek shade when your shadow is shorter than you. Protect yourself by wearing long sleeves, pants, a wide-brimmed hat, and sunglasses year round, whenever you are outdoors.  . Once a month, do a whole body skin exam, using a mirror to look at the skin on your back. Tell your health care provider of new moles, moles that have irregular borders, moles that are larger than a pencil eraser, or moles that have changed in shape or color.

## 2018-09-07 NOTE — Progress Notes (Signed)
Subjective:   Heather Potter is a 73 y.o. female who presents for Medicare Annual (Subsequent) preventive examination.  Review of Systems:  N/A Cardiac Risk Factors include: advanced age (>51men, >30 women);dyslipidemia;hypertension     Objective:     Vitals: There were no vitals taken for this visit.  There is no height or weight on file to calculate BMI.  Advanced Directives 09/07/2018 08/26/2017 08/23/2016 05/25/2015  Does Patient Have a Medical Advance Directive? No No No No  Would patient like information on creating a medical advance directive? No - Patient declined No - Patient declined - No - patient declined information    Tobacco Social History   Tobacco Use  Smoking Status Former Smoker  . Last attempt to quit: 05/21/2007  . Years since quitting: 11.3  Smokeless Tobacco Never Used     Counseling given: No   Clinical Intake:  Pre-visit preparation completed: Yes  Pain : 0-10 Pain Score: 2  Pain Type: Chronic pain Pain Location: Generalized Pain Onset: More than a month ago Pain Frequency: Constant     Nutritional Risks: None Diabetes: No  How often do you need to have someone help you when you read instructions, pamphlets, or other written materials from your doctor or pharmacy?: 1 - Never What is the last grade level you completed in school?: 12th grade  Interpreter Needed?: No  Information entered by :: LPinson, LPN  Past Medical History:  Diagnosis Date  . Colitis   . History of blood transfusion    D and C and miscarriage  . History of gynecologic surgery    had right ovary removed, unsure about uterus/cervix  . Hyperlipidemia   . Hypertension   . Hypothyroidism   . Lichen planus 10/22/5407   Treated by Dr. Kellie Moor (derm) with methotrexate  . Osteoarthritis   . Osteopenia    Past Surgical History:  Procedure Laterality Date  . APPENDECTOMY    . [place]   colectomy for ? Diverticulits  . DILATION AND CURETTAGE OF UTERUS     . ESOPHAGEAL MANOMETRY N/A 08/21/2015   Procedure: ESOPHAGEAL MANOMETRY (EM);  Surgeon: Mauri Pole, MD;  Location: [facility];  Service: Endoscopy;  Laterality: N/A;  . HERNIA REPAIR     right lower abd  . 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 OOPHORECTOMY     right  . OTHER SURGICAL HISTORY     part of intestine removed  . TUBAL LIGATION     bilat   Family History  Problem Relation Age of Onset  . Pancreatic cancer Mother   . Heart attack Father   . Liver cancer Brother   . Colon cancer Neg Hx   . Breast cancer Neg Hx    Social History   Socioeconomic History  . Marital status: Married    Spouse name: Not on file  . Number of children: 1  . Years of education: Not on file  . Highest education level: Not on file  Occupational History  . Occupation: Agricultural engineer  Social Needs  . Financial resource strain: Not on file  . Food insecurity:    Worry: Not on file    Inability: Not on file  . Transportation needs:    Medical: Not on file    Non-medical: Not on file  Tobacco Use  . Smoking status: Former Smoker    Last attempt to quit: 05/21/2007    Years since quitting: 11.3  . Smokeless  tobacco: Never Used  Substance and Sexual Activity  . Alcohol use: Yes    Comment: rare- wine  . Drug use: No  . Sexual activity: Not on file  Lifestyle  . Physical activity:    Days per week: Not on file    Minutes per session: Not on file  . Stress: Not on file  Relationships  . Social connections:    Talks on phone: Not on file    Gets together: Not on file    Attends religious service: Not on file    Active member of club or organization: Not on file    Attends meetings of clubs or organizations: Not on file    Relationship status: Not on file  Other Topics Concern  . Not on file  Social History Narrative   One adopted son   Regular exercise: yes      Does not have a living will or HPOA.   Desires CPR and life support for only short period if not  futile.          Outpatient Encounter Medications as of 09/07/2018  Medication Sig  . acetaminophen (TYLENOL) 500 MG tablet Take 500 mg by mouth every 6 (six) hours as needed.  Marland Kitchen amLODipine (NORVASC) 10 MG tablet Take 1 tablet (10 mg total) by mouth daily.  Marland Kitchen aspirin EC 81 MG tablet Take 81 mg by mouth daily.  . Calcium Carbonate-Vit D-Min (CALCIUM 1200 PO) Take 1 tablet by mouth daily.  . cholecalciferol (VITAMIN D) 400 units TABS tablet Take 400 Units by mouth.  . cyclobenzaprine (FLEXERIL) 10 MG tablet Take 0.5-1 tablets (5-10 mg total) by mouth at bedtime as needed for muscle spasms.  Marland Kitchen gabapentin (NEURONTIN) 100 MG capsule Take 1 capsule by mouth twice daily  . hydrochlorothiazide (HYDRODIURIL) 25 MG tablet Take 1 tablet (25 mg total) by mouth daily.  Marland Kitchen levothyroxine (SYNTHROID, LEVOTHROID) 75 MCG tablet Take 1 tablet by mouth daily  . predniSONE (DELTASONE) 1 MG tablet Take 2 tablets (2 mg total) by mouth daily with breakfast. Will be taking with 5 mg tablets for titration.  . predniSONE (DELTASONE) 5 MG tablet Take 2 tablets (10 mg total) by mouth daily with breakfast. Will be taking along with 1 mg tablets for titration.  . simvastatin (ZOCOR) 20 MG tablet Take 0.5 tablets (10 mg total) by mouth daily at 6 PM.  . traMADol (ULTRAM) 50 MG tablet Take 1 tablet (50 mg total) by mouth every 12 (twelve) hours as needed.   No facility-administered encounter medications on file as of 09/07/2018.     Activities of Daily Living In your present state of health, do you have any difficulty performing the following activities: 09/07/2018  Hearing? N  Vision? N  Difficulty concentrating or making decisions? N  Walking or climbing stairs? N  Dressing or bathing? N  Doing errands, shopping? N  Preparing Food and eating ? N  Using the Toilet? N  In the past six months, have you accidently leaked urine? N  Do you have problems with loss of bowel control? N  Managing your Medications? N   Managing your Finances? N  Housekeeping or managing your Housekeeping? N  Some recent data might be hidden    Patient Care Team: Elby Beck, FNP as PCP - General (Nurse Practitioner) Zehr, Laban Emperor, PA-C as Physician Assistant (Gastroenterology) Mauri Pole, MD as Consulting Physician (Gastroenterology)    Assessment:   This is a routine wellness examination for Heather Potter.  Vision Screening Comments: Last vision exam approx. 2 yrs ago  Exercise Activities and Dietary recommendations Current Exercise Habits: Home exercise routine, Type of exercise: walking, Time (Minutes): 20, Frequency (Times/Week): 7, Weekly Exercise (Minutes/Week): 140, Intensity: Mild, Exercise limited by: None identified  Goals    . Follow up with Primary Care Provider     Starting 09/07/2018, I will continue to take medications as prescribed and to keep appointments with PCP as scheduled.        Fall Risk Fall Risk  09/07/2018 08/26/2017 08/23/2016 07/06/2015 08/18/2013  Falls in the past year? 0 Yes No No No  Number falls in past yr: - 1 - - -  Injury with Fall? - Yes - - -   Depression Screen PHQ 2/9 Scores 09/07/2018 08/26/2017 08/23/2016 07/06/2015  PHQ - 2 Score 0 2 3 0  PHQ- 9 Score 0 4 4 -     Cognitive Function MMSE - Mini Mental State Exam 09/07/2018 08/26/2017 08/23/2016  Orientation to time 5 5 5   Orientation to Place 5 5 5   Registration 3 3 3   Attention/ Calculation 0 0 0  Recall 3 3 3   Language- name 2 objects 0 0 0  Language- repeat 1 1 1   Language- follow 3 step command 0 3 3  Language- read & follow direction 0 0 0  Write a sentence 0 0 0  Copy design 0 0 0  Total score 17 20 20      PLEASE NOTE: A Mini-Cog screen was completed. Maximum score is 17. A value of 0 denotes this part of Folstein MMSE was not completed or the patient failed this part of the Mini-Cog screening.   Mini-Cog Screening Orientation to Time - Max 5 pts Orientation to Place - Max 5 pts Registration - Max 3  pts Recall - Max 3 pts Language Repeat - Max 1 pts    Immunization History  Administered Date(s) Administered  . Influenza Whole 03/10/2013  . Influenza, High Dose Seasonal PF 02/27/2017, 02/14/2018  . Influenza,inj,Quad PF,6+ Mos 02/16/2014, 02/06/2016  . Influenza-Unspecified 03/14/2015  . Pneumococcal Conjugate-13 08/18/2013  . Pneumococcal Polysaccharide-23 11/28/2010  . Zoster 11/28/2010      Screening Tests Health Maintenance  Topic Date Due  . INFLUENZA VACCINE  12/19/2018  . MAMMOGRAM  10/30/2019  . TETANUS/TDAP  11/27/2020  . COLONOSCOPY  12/18/2020  . DEXA SCAN  Completed  . Hepatitis C Screening  Completed  . PNA vac Low Risk Adult  Completed      Plan:     I have personally reviewed, addressed, and noted the following in the patient's chart:  A. Medical and social history B. Use of alcohol, tobacco or illicit drugs  C. Current medications and supplements D. Functional ability and status E.  Nutritional status F.  Physical activity G. Advance directives H. List of other physicians I.  Hospitalizations, surgeries, and ER visits in previous 12 months J.  Vitals (unless it is a telemedicine encounter) K. Screenings to include hearing, vision, cognitive, depression L. Referrals and appointments   In addition, I have reviewed and discussed with patient certain preventive protocols, quality metrics, and best practice recommendations. A written personalized care plan for preventive services and recommendations were provided to patient.  With patient's permission, we connected on 09/07/18 at 10:00 AM EDT by a video enabled telemedicine application. Two patient identifiers were used to ensure the encounter occurred with the correct person. .   Patient was in home and writer was in office.  Signed,   Lindell Noe, MHA, BS, LPN Health Coach

## 2018-09-07 NOTE — Progress Notes (Signed)
PCP notes:   Health maintenance:  No gaps identified.  Abnormal screenings:   None  Patient concerns:   None  Nurse concerns:  None  Next PCP appt:   09/11/18 @ 1030

## 2018-09-09 NOTE — Progress Notes (Signed)
I reviewed health advisor's note, was available for consultation, and agree with documentation and plan.  

## 2018-09-11 ENCOUNTER — Ambulatory Visit: Payer: PPO | Admitting: Family Medicine

## 2018-09-18 ENCOUNTER — Telehealth: Payer: Self-pay

## 2018-09-18 NOTE — Telephone Encounter (Signed)
According to Heather Potter's most recent office notes she will need to reduce down to 11 mg which is two of her 5 mg prednisone tablets and one of her 1 mg prednisone tablets. She will do this for 8 weeks total. She is to call Heather Potter if she has problems with the reduced dose.

## 2018-09-18 NOTE — Telephone Encounter (Signed)
Pt left v/m; pt has taken prednisone 12.5 mg for 2 wks; pt wants to know if she is to take 11 mg or what dose should pt be taking. Pt seen on 09/02/18 and in that office note has pt to take Prednisone 12.5 mg for 2 wks. And then decrease to 11 mg for 8 wks. Is this what pt should be doing? Glenda Chroman FNP out of office (on vacation)Please advise. Pt  Has medication per med list.

## 2018-09-18 NOTE — Telephone Encounter (Signed)
Spoken and notified patient of Kate Clark's comments. Patient verbalized understanding.  

## 2018-09-29 ENCOUNTER — Telehealth: Payer: Self-pay | Admitting: *Deleted

## 2018-09-29 NOTE — Telephone Encounter (Signed)
Patient notified as instructed by telephone and verbalized understanding. Patient stated that she took a Tylenol and it helped. Appointment scheduled with Tor Netters NP 09/30/18.

## 2018-09-29 NOTE — Telephone Encounter (Signed)
I am not sure if that is from her PMR/ reduced prednisone. Please schedule her in the office to be checked.

## 2018-09-29 NOTE — Telephone Encounter (Signed)
Patient called stating that she was on Prednisone 12.5 mg and it was recently reduced to 11 mg. Patient stated that she is having problems with just her right leg since reducing the Prednisone. Patient state that she can hardly move her leg and it is sensitive to touch. Patient stated that it is hard to walk on it because of the pain. Patient wants to know if she should increase her Prednisone?

## 2018-09-30 ENCOUNTER — Other Ambulatory Visit: Payer: Self-pay

## 2018-09-30 ENCOUNTER — Encounter: Payer: Self-pay | Admitting: Family Medicine

## 2018-09-30 ENCOUNTER — Ambulatory Visit (INDEPENDENT_AMBULATORY_CARE_PROVIDER_SITE_OTHER): Payer: PPO | Admitting: Family Medicine

## 2018-09-30 VITALS — BP 134/68 | HR 70 | Temp 98.2°F | Ht 62.5 in | Wt 145.5 lb

## 2018-09-30 DIAGNOSIS — M79604 Pain in right leg: Secondary | ICD-10-CM

## 2018-09-30 NOTE — Patient Instructions (Signed)
Please increase prednisone to 15 mg for 1 week- call me and let me know how you are doing

## 2018-09-30 NOTE — Progress Notes (Signed)
Subjective:    Patient ID: Heather Potter, female    DOB: 03-12-1946, 73 y.o.   MRN: [id number]  HPI This is a 73 year old female who presents today for evaluation of right hip/groin pain.  Has been hurting her for about 1-1/2 weeks.  No known injury.  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 Was diagnosed earlier this year and has been on prednisone. She reports doing ok at 11 mg.  She continues to have difficulty raising her arms above her shoulders and she has a lot of morning stiffness and pain.  She had previously been maintained on tramadol 50 mg twice daily for several years and is recently stopped her evening dose.  She wonders if this is also contributing to her pain.  Her right leg pain is in her groin and to bend of her hip.  She has very little pain in her buttock.  She does not recall any recent falls or over use.  She did have bilateral groin pain prior to starting prednisone for PMR. She continues to work out in her yard including mowing the grass with a Chiropractor.  Past Medical History:  Diagnosis Date  . Colitis   . History of blood transfusion    D and C and miscarriage  . History of gynecologic surgery    had right ovary removed, unsure about uterus/cervix  . Hyperlipidemia   . Hypertension   . Hypothyroidism   . Lichen planus 2/87/8676   Treated by Dr. Kellie Moor (derm) with methotrexate  . Osteoarthritis   . Osteopenia    Past Surgical History:  Procedure Laterality Date  . APPENDECTOMY    . [place]   colectomy for ? Diverticulits  . DILATION AND CURETTAGE OF UTERUS    . ESOPHAGEAL MANOMETRY N/A 08/21/2015   Procedure: ESOPHAGEAL MANOMETRY (EM);  Surgeon: Mauri Pole, MD;  Location: [facility];  Service: Endoscopy;  Laterality: N/A;  . HERNIA REPAIR     right lower abd  . 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 OOPHORECTOMY     right  . OTHER SURGICAL HISTORY     part of intestine removed  . TUBAL LIGATION     bilat    Family History  Problem Relation Age of Onset  . Pancreatic cancer Mother   . Heart attack Father   . Liver cancer Brother   . Colon cancer Neg Hx   . Breast cancer Neg Hx    Social History   Tobacco Use  . Smoking status: Former Smoker    Last attempt to quit: 05/21/2007    Years since quitting: 11.3  . Smokeless tobacco: Never Used  Substance Use Topics  . Alcohol use: Yes    Comment: rare- wine  . Drug use: No      Review of Systems Per HPI    Objective:   Physical Exam Vitals signs reviewed.  Constitutional:      General: She is not in acute distress.    Appearance: Normal appearance. She is normal weight. She is not ill-appearing, toxic-appearing or diaphoretic.  HENT:     Head: Normocephalic and atraumatic.  Eyes:     Conjunctiva/sclera: Conjunctivae normal.  Neck:     Musculoskeletal: Normal range of motion.  Cardiovascular:     Rate and Rhythm: Normal rate.  Pulmonary:     Effort: Pulmonary effort is normal.  Musculoskeletal:     Right hip: She exhibits decreased range  of motion (flexion, internal/external rotation), decreased strength and tenderness (medially.). She exhibits no bony tenderness.  Skin:    General: Skin is warm and dry.  Neurological:     Mental Status: She is alert and oriented to person, place, and time.  Psychiatric:        Mood and Affect: Mood normal.        Behavior: Behavior normal.        Thought Content: Thought content normal.        Judgment: Judgment normal.       BP 134/68   Pulse 70   Temp 98.2 F (36.8 C)   Ht 5' 2.5" (1.588 m)   Wt 145 lb 8 oz (66 kg)   SpO2 97%   BMI 26.19 kg/m  Wt Readings from Last 3 Encounters:  09/30/18 145 lb 8 oz (66 kg)  09/02/18 143 lb (64.9 kg)  07/13/18 145 lb (65.8 kg)       Assessment & Plan:  1. Right leg pain -This is consistent with her prior PMR pain.  We will increase her prednisone to 15 mg daily for 1 week and she will contact me and let me know how she is doing.  If  this does not improve pain we will consider getting an x-ray. -Continue activity as tolerated, can try heat/ice -Follow-up on file for next month -Discussed pros and cons of taking tramadol twice daily as well as acetaminophen usage.  Her morning stiffness is quite significant and limiting and she tolerated tramadol twice daily without any side effects.  Encouraged her to go back to this at least for the short-term.  Clarene Reamer, FNP-BC  [facility]  09/30/2018 12:30 PM

## 2018-10-08 ENCOUNTER — Encounter: Payer: Self-pay | Admitting: Family Medicine

## 2018-10-09 NOTE — Telephone Encounter (Signed)
Patient left a voicemail stating that she sent Tor Netters NP a mychart message and just wanted to make sure that she got it.

## 2018-11-02 ENCOUNTER — Ambulatory Visit (INDEPENDENT_AMBULATORY_CARE_PROVIDER_SITE_OTHER): Payer: PPO | Admitting: Family Medicine

## 2018-11-02 ENCOUNTER — Encounter: Payer: Self-pay | Admitting: Family Medicine

## 2018-11-02 ENCOUNTER — Other Ambulatory Visit: Payer: Self-pay

## 2018-11-02 ENCOUNTER — Ambulatory Visit (INDEPENDENT_AMBULATORY_CARE_PROVIDER_SITE_OTHER)
Admission: RE | Admit: 2018-11-02 | Discharge: 2018-11-02 | Disposition: A | Discharge status: Home / Self Care | Payer: PPO | Source: Ambulatory Visit | Attending: Family Medicine | Admitting: Family Medicine

## 2018-11-02 VITALS — BP 125/60 | HR 103 | Temp 97.9°F | Ht 62.5 in | Wt 148.8 lb

## 2018-11-02 DIAGNOSIS — Z7952 Long term (current) use of systemic steroids: Secondary | ICD-10-CM | POA: Diagnosis not present

## 2018-11-02 DIAGNOSIS — M353 Polymyalgia rheumatica: Secondary | ICD-10-CM

## 2018-11-02 DIAGNOSIS — M25551 Pain in right hip: Secondary | ICD-10-CM

## 2018-11-02 DIAGNOSIS — M87051 Idiopathic aseptic necrosis of right femur: Secondary | ICD-10-CM

## 2018-11-02 IMAGING — DX DG HIP (WITH OR WITHOUT PELVIS) 2-3V RIGHT
3 series · 3 of 3 positions shown · non-contrast
Comparison: None.

CLINICAL DATA: Acute hip pain, severe loss of motion x 2 months, on
steroids for PMR, AVN vs advanced OA

EXAM:
DG HIP (WITH OR WITHOUT PELVIS) 2-3V RIGHT

[pelvis ap]
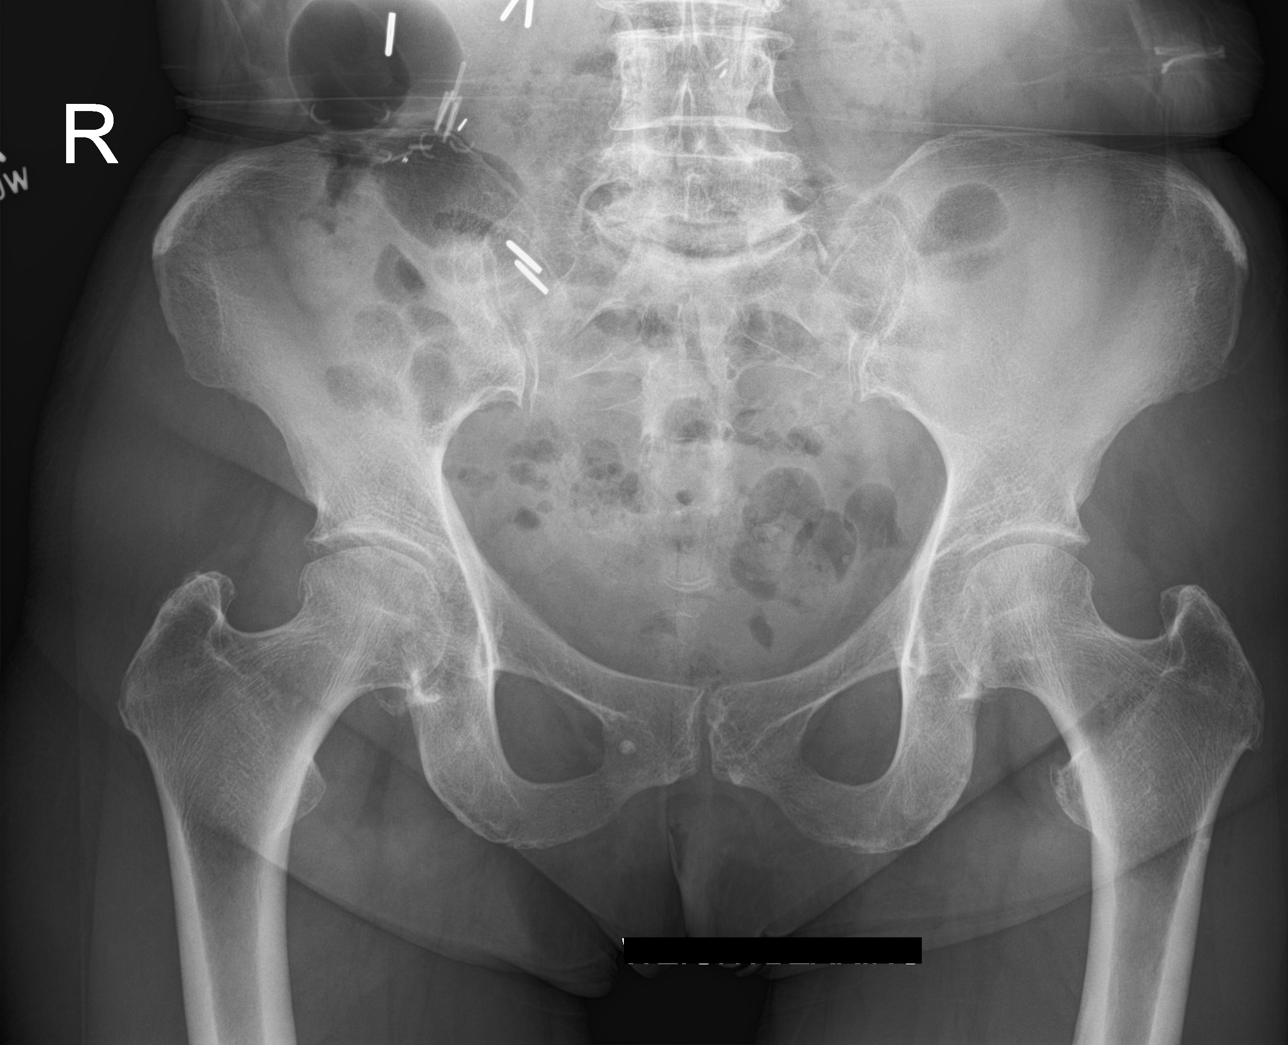

[hip ap]
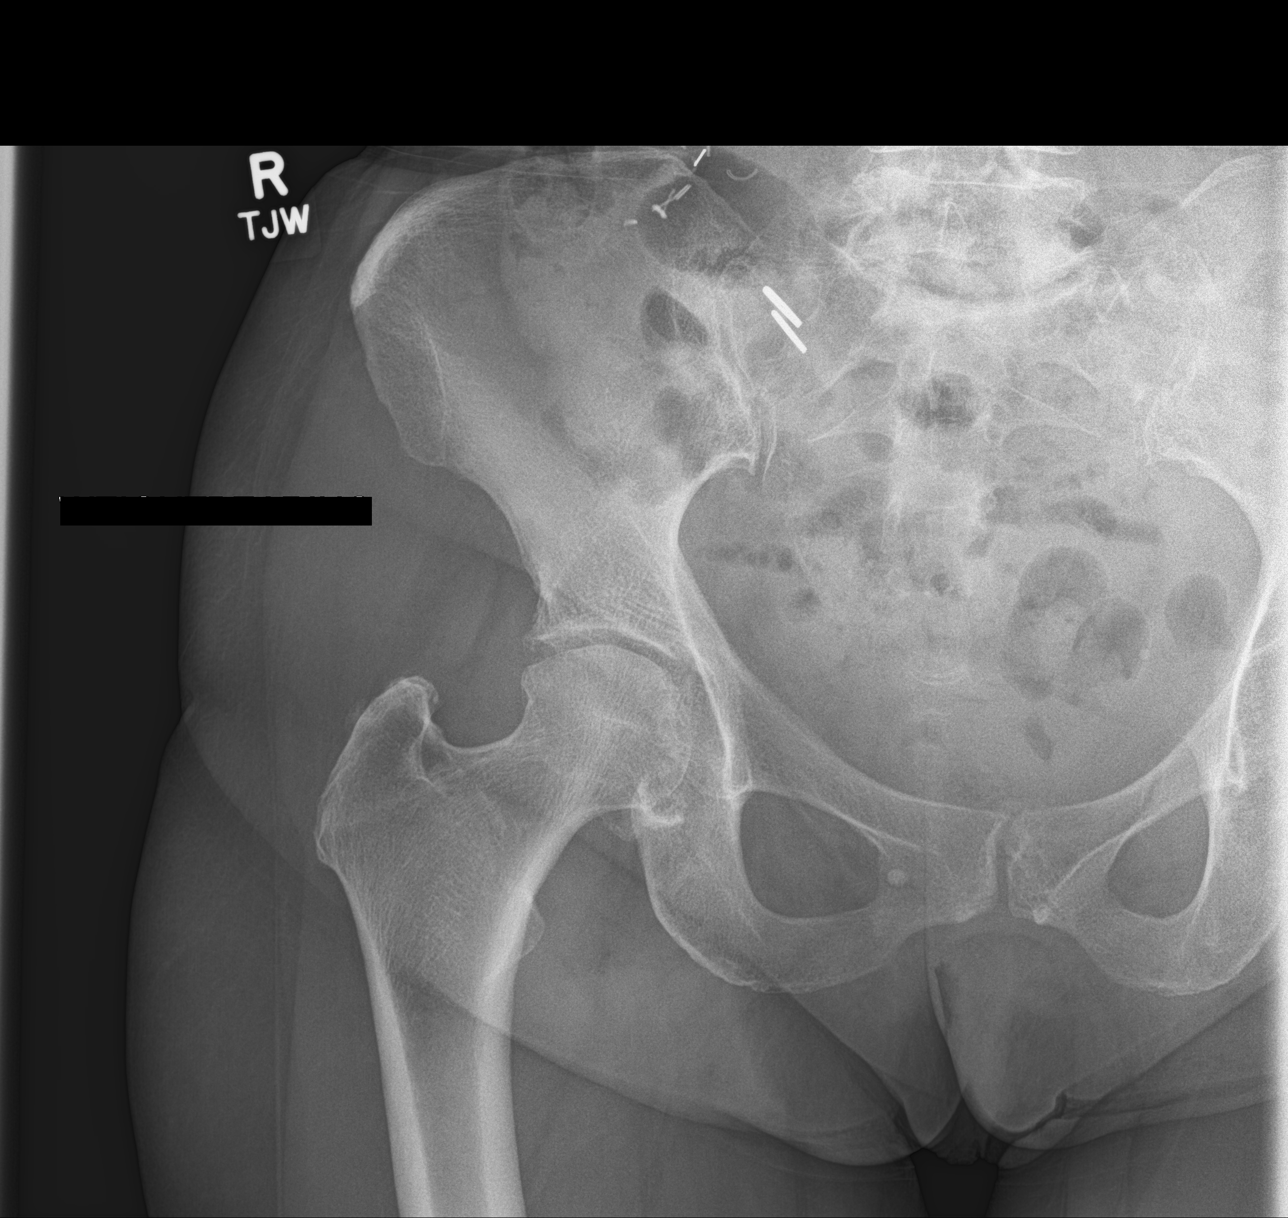

[hip lat]
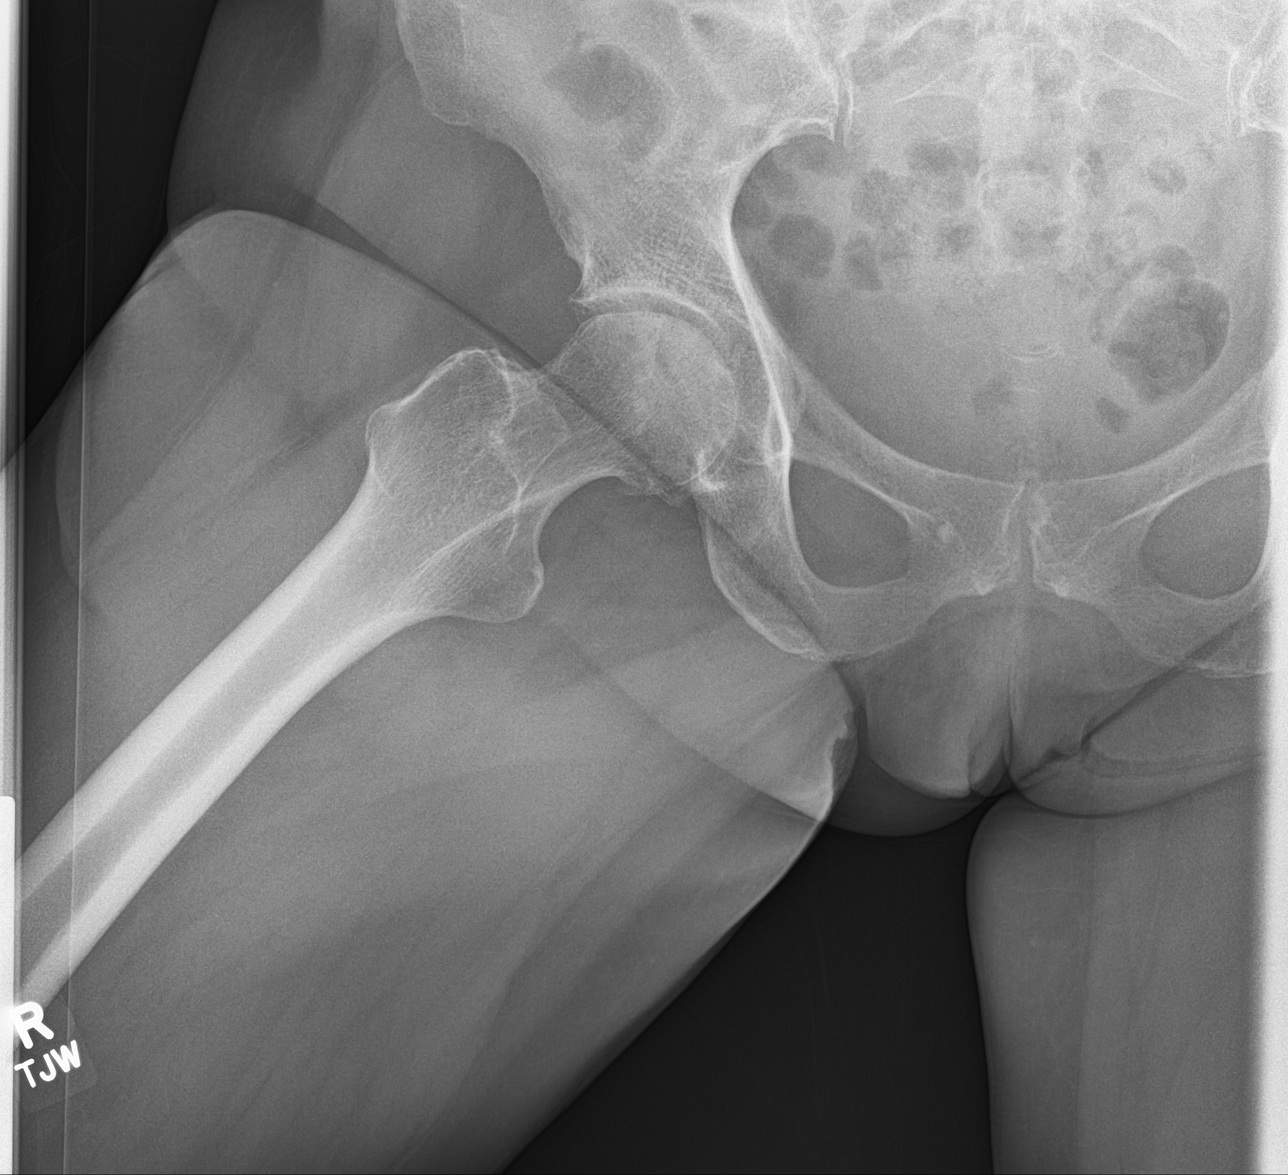

[3 of 3 positions shown; findings below may reference images not displayed]

FINDINGS: Single view of the pelvis and two views of the RIGHT hip are
provided. Osseous alignment is normal. Subtle sclerotic change and
heterogeneity of the RIGHT femoral head, only seen on frog-leg view,
suspicious for avascular necrosis. No fracture line or displaced
fracture fragment seen. No significant degenerative change at either
hip joint.
IMPRESSION: 1. Probable AVN at the RIGHT femoral head. Consider MRI for
confirmation.
2. No acute findings. No evidence of advanced degenerative
osteoarthritis at either hip joint.

## 2018-11-02 NOTE — Progress Notes (Signed)
Hannan Hutmacher T. Austen Oyster, MD Primary Care and Sports Medicine [facility] at [facility] [place], [address] Phone: [phone]   FAX: [phone]  Heather Potter - 73 y.o. female   MRN [id number]   Date of Birth: [birth year]  Visit Date: 11/02/2018   PCP: Elby Beck, FNP   Referred by: Elby Beck, FNP  Chief Complaint  Patient presents with   Leg Pain   Subjective:   Heather Potter is a 73 y.o. very pleasant female patient who presents with the following:  PMR R now: She has been having some fairly severe joint pain in her right groin for the past 2 months.  She has been on steroids for 6 months, and much of that time zone 15 mg daily.  Right now she is having some severe limitation in motion and functional disability.  She cannot go up stairs with 2 legs.  She is having significant difficulty getting in and out of a car.  She not cannot cross her legs, she is having difficulty getting dressed.  She has difficulty getting in and out of a chair.  She is not having any back pain.  R groin hurts really bad.  Sometimes will go down to the leg.  Disturbing her sitting even.  Started aching about 2 months .  Tub, clothes, getting in and out of a car.   Hip x-ray f/u If normal, then MRI hip R   Past Medical History, Surgical History, Social History, Family History, Problem List, Medications, and Allergies have been reviewed and updated if relevant.  Patient Active Problem List   Diagnosis Date Noted   PMR (polymyalgia rheumatica) ([place]) 67/89/3810   Lichen planus 17/51/0258   Well woman exam 08/28/2016   Superficial varicosities 09/13/2015   Osteopenia    Hypothyroidism 10/27/2009   HLD (hyperlipidemia) 10/27/2009   Essential hypertension 10/27/2009   Osteoarthritis 10/27/2009   NEPHRECTOMY, HX OF 10/27/2009    Past Medical History:  Diagnosis Date   Colitis    History of blood transfusion    D and C and  miscarriage   History of gynecologic surgery    had right ovary removed, unsure about uterus/cervix   Hyperlipidemia    Hypertension    Hypothyroidism    Lichen planus 10/14/7822   Treated by Dr. Kellie Moor (derm) with methotrexate   Osteoarthritis    Osteopenia     Past Surgical History:  Procedure Laterality Date   [place]   colectomy for ? 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   OOPHORECTOMY     right   OTHER SURGICAL HISTORY     part of intestine removed   TUBAL LIGATION     bilat    Social History   Socioeconomic History   Marital status: Married    Spouse name: Not on file   Number of children: 1   Years of education: Not on file   Highest education level: Not on file  Occupational History   Occupation: Agricultural engineer  Social Needs   Financial resource strain: Not on file   Food insecurity  Worry: Not on file    Inability: Not on file   Transportation needs    Medical: Not on file    Non-medical: Not on file  Tobacco Use   Smoking status: Former Smoker    Quit date: 05/21/2007    Years since quitting: 11.4   Smokeless tobacco: Never Used  Substance and Sexual Activity   Alcohol use: Yes    Comment: rare- wine   Drug use: No   Sexual activity: Not on file  Lifestyle   Physical activity    Days per week: Not on file    Minutes per session: Not on file   Stress: Not on file  Relationships   Social connections    Talks on phone: Not on file    Gets together: Not on file    Attends religious service: Not on file    Active member of club or organization: Not on file    Attends meetings of clubs or organizations: Not on file     Relationship status: Not on file   Intimate partner violence    Fear of current or ex partner: Not on file    Emotionally abused: Not on file    Physically abused: Not on file    Forced sexual activity: Not on file  Other Topics Concern   Not on file  Social History Narrative   One adopted son   Regular exercise: yes      Does not have a living will or HPOA.   Desires CPR and life support for only short period if not futile.          Family History  Problem Relation Age of Onset   Pancreatic cancer Mother    Heart attack Father    Liver cancer Brother    Colon cancer Neg Hx    Breast cancer Neg Hx     No Known Allergies  Medication list reviewed and updated in full in [place].  GEN: No fevers, chills. Nontoxic. Primarily MSK c/o today. MSK: Detailed in the HPI GI: tolerating PO intake without difficulty Neuro: No numbness, parasthesias, or tingling associated. Otherwise the pertinent positives of the ROS are noted above.   Objective:   BP 125/60    Pulse (!) 103    Temp 97.9 F (36.6 C) (Other (Comment))    Ht 5' 2.5" (1.588 m)    Wt 148 lb 12 oz (67.5 kg)    SpO2 (!) 80%    BMI 26.77 kg/m    GEN: WDWN, NAD, Non-toxic, Alert & Oriented x 3 HEENT: Atraumatic, Normocephalic.  Ears and Nose: No external deformity. EXTR: No clubbing/cyanosis/edema NEURO: markedly abnormal and antalgic gait PSYCH: Normally interactive. Conversant. Not depressed or anxious appearing.  Calm demeanor.   HIP EXAM: SIDE: R ROM: Abduction, Flexion, Internal and External range of motion: Abd limited to 15 deg, IROM to EROM limited to approx 7 deg total with hip flexed to 90 Pain with terminal IROM and EROM: yes GTB: NT Knees: No effusion Piriformis: NT at direct palpation Str: flexion: 4-/5 abduction: 4-/5 adduction: 4-/5  L side has full ROM  Radiology: Dg Hip Unilat W Or W/o Pelvis 2-3 Views Right  Result Date: 11/02/2018 CLINICAL DATA:  Acute hip pain, severe  loss of motion x 2 months, on steroids for PMR, AVN vs advanced OA EXAM: DG HIP (WITH OR WITHOUT PELVIS) 2-3V RIGHT COMPARISON:  None. FINDINGS: Single view of the pelvis and two views of the RIGHT hip  are provided. Osseous alignment is normal. Subtle sclerotic change and heterogeneity of the RIGHT femoral head, only seen on frog-leg view, suspicious for avascular necrosis. No fracture line or displaced fracture fragment seen. No significant degenerative change at either hip joint. IMPRESSION: 1. 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 Consider MRI for confirmation. 2. No acute findings. No evidence of advanced degenerative osteoarthritis at either hip joint. Electronically Signed   By: Franki Cabot M.D.   On: 11/02/2018 14:20     Assessment and Plan:     ICD-10-CM   1. Avascular necrosis of bone of hip, right ([place])  M87.051 MR HIP RIGHT WO CONTRAST    Ambulatory referral to Rheumatology  2. Acute right hip pain  M25.551 DG Hip Unilat W OR W/O Pelvis 2-3 Views Right    DG Hip Unilat W OR W/O Pelvis 2-3 Views Right    MR HIP RIGHT WO CONTRAST    Ambulatory referral to Rheumatology  3. PMR (polymyalgia rheumatica) (HCC)  M35.3 Ambulatory referral to Rheumatology  4. Long term current use of systemic steroids  Z79.52 MR HIP RIGHT WO CONTRAST    Ambulatory referral to Rheumatology   In a setting of probable avascular necrosis seen on one view of the hip films, frog view, independently reviewed by myself as well as radiology, this injury which corresponds to the clinical examination and a 50-month history of oral steroids needs to be characterized further.  MRI of the right hip without contrast to evaluate extent of avascular necrosis or less likely occult fracture.  Anticipate consultation with total joint surgeon.  Given complexity of the case with long-term steroids in the setting of polymyalgia rheumatica, I am also get a consult rheumatology and get their input with management, particularly with  steroids in the setting of avascular necrosis.  Follow-up: depending on MRI  No orders of the defined types were placed in this encounter.  Orders Placed This Encounter  Procedures   DG Hip Unilat W OR W/O Pelvis 2-3 Views Right   MR HIP RIGHT WO CONTRAST   Ambulatory referral to Rheumatology    Signed,  Lenah Messenger T. Kassaundra Hair, MD   Outpatient Encounter Medications as of 11/02/2018  Medication Sig   acetaminophen (TYLENOL) 500 MG tablet Take 500 mg by mouth every 6 (six) hours as needed.   amLODipine (NORVASC) 10 MG tablet Take 1 tablet (10 mg total) by mouth daily.   aspirin EC 81 MG tablet Take 81 mg by mouth daily.   Calcium Carbonate-Vit D-Min (CALCIUM 1200 PO) Take 1 tablet by mouth daily.   cholecalciferol (VITAMIN D) 400 units TABS tablet Take 400 Units by mouth.   cyclobenzaprine (FLEXERIL) 10 MG tablet Take 0.5-1 tablets (5-10 mg total) by mouth at bedtime as needed for muscle spasms. (Patient not taking: Reported on 09/30/2018)   gabapentin (NEURONTIN) 100 MG capsule Take 1 capsule by mouth twice daily   hydrochlorothiazide (HYDRODIURIL) 25 MG tablet Take 1 tablet (25 mg total) by mouth daily.   levothyroxine (SYNTHROID, LEVOTHROID) 75 MCG tablet Take 1 tablet by mouth daily   predniSONE (DELTASONE) 1 MG tablet Take 2 tablets (2 mg total) by mouth daily with breakfast. Will be taking with 5 mg tablets for titration.   predniSONE (DELTASONE) 5 MG tablet Take 2 tablets (10 mg total) by mouth daily with breakfast. Will be taking along with 1 mg tablets for titration.   simvastatin (ZOCOR) 20 MG tablet Take 0.5 tablets (10 mg total) by mouth daily at 6  PM.   traMADol (ULTRAM) 50 MG tablet Take 1 tablet (50 mg total) by mouth every 12 (twelve) hours as needed.   No facility-administered encounter medications on file as of 11/02/2018.

## 2018-11-03 ENCOUNTER — Encounter: Payer: Self-pay | Admitting: Family Medicine

## 2018-11-07 ENCOUNTER — Ambulatory Visit
Admission: RE | Admit: 2018-11-07 | Discharge: 2018-11-07 | Disposition: A | Discharge status: Home / Self Care | Payer: PPO | Source: Ambulatory Visit | Attending: Family Medicine | Admitting: Family Medicine

## 2018-11-07 ENCOUNTER — Other Ambulatory Visit: Payer: Self-pay

## 2018-11-07 DIAGNOSIS — Z7952 Long term (current) use of systemic steroids: Secondary | ICD-10-CM | POA: Diagnosis not present

## 2018-11-07 DIAGNOSIS — M25551 Pain in right hip: Secondary | ICD-10-CM | POA: Diagnosis not present

## 2018-11-07 DIAGNOSIS — M1611 Unilateral primary osteoarthritis, right hip: Secondary | ICD-10-CM | POA: Diagnosis not present

## 2018-11-07 DIAGNOSIS — M87051 Idiopathic aseptic necrosis of right femur: Secondary | ICD-10-CM | POA: Diagnosis not present

## 2018-11-07 IMAGING — MR MRI OF THE RIGHT HIP WITHOUT CONTRAST
5 series · 32 of 40 positions shown · non-contrast
Comparison: Plain films right hip [DATE].

CLINICAL DATA: Right hip pain for 2 months. Question avascular
necrosis.

EXAM:
MR OF THE RIGHT HIP WITHOUT CONTRAST
TECHNIQUE: Multiplanar, multisequence MR imaging was performed. No intravenous
contrast was administered.

[Series 3: T1 · coronal · right · 4.0mm · 0.47mm/px · 2 of 38 slices shown]
[im 1/38]
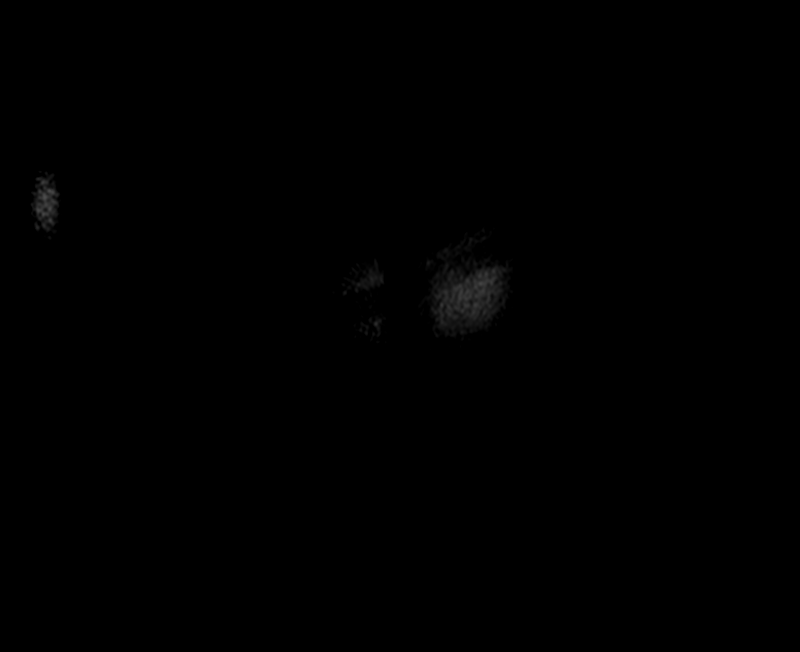
[im 5/38]
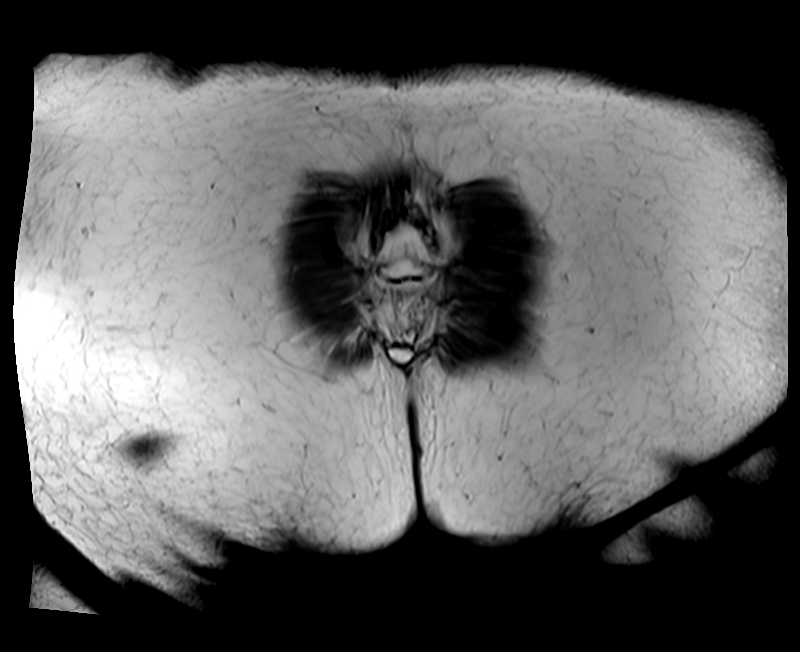

[Series 4: T2 fat-sat · coronal · right · 4.0mm · 1.19mm/px · 9 of 38 slices shown (1 of 2)]
[im 1/38]
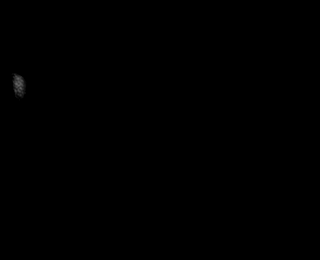
[im 5/38]
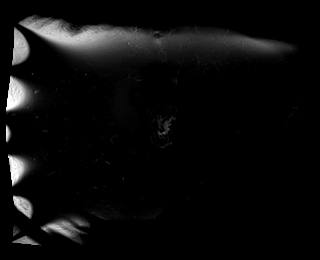
[im 10/38]
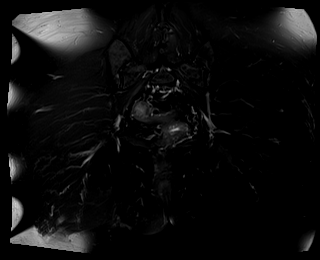
[im 14/38]
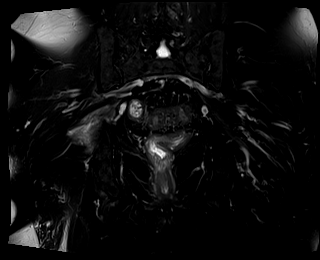
[im 19/38]
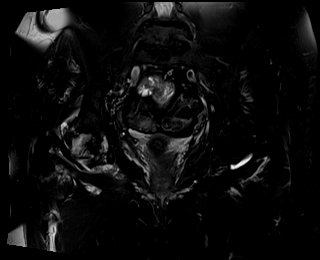
[im 24/38]
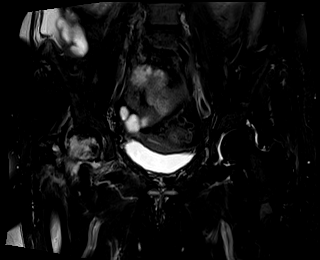
[im 28/38]
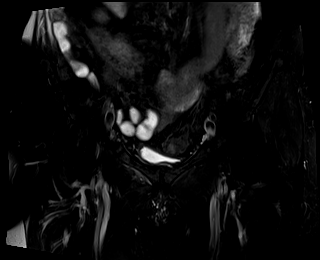
[im 33/38]
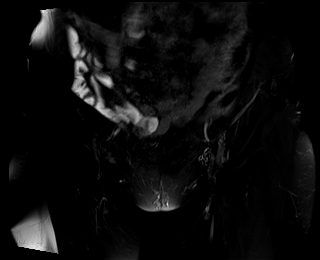
[im 38/38]
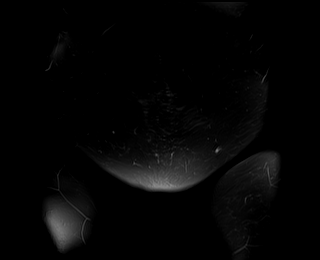

[Series 5: T2 fat-sat · axial · right · 4.0mm · 0.35mm/px · z∈[-115,+30]mm · 7 of 30 slices shown (2 of 2)]
[im 1/30]
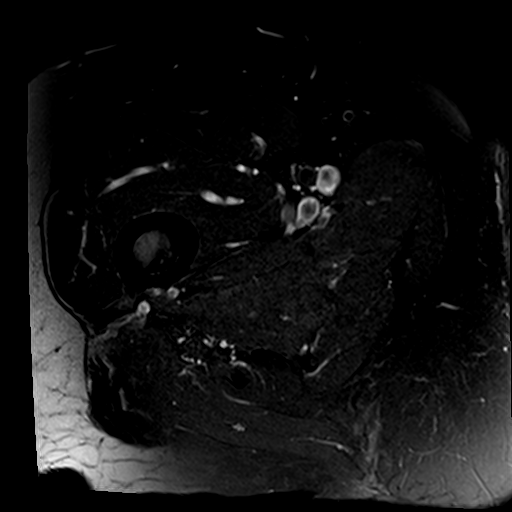
[im 5/30]
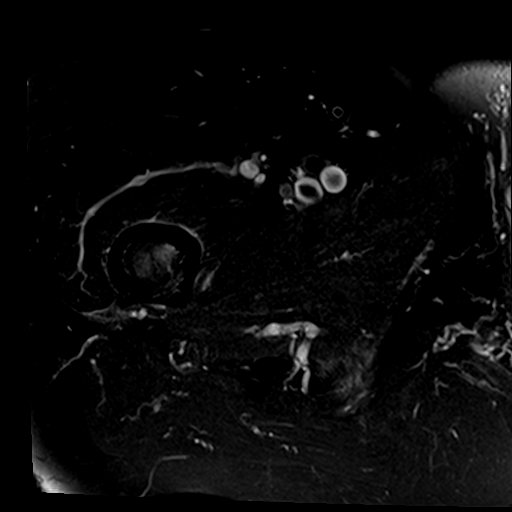
[im 10/30]
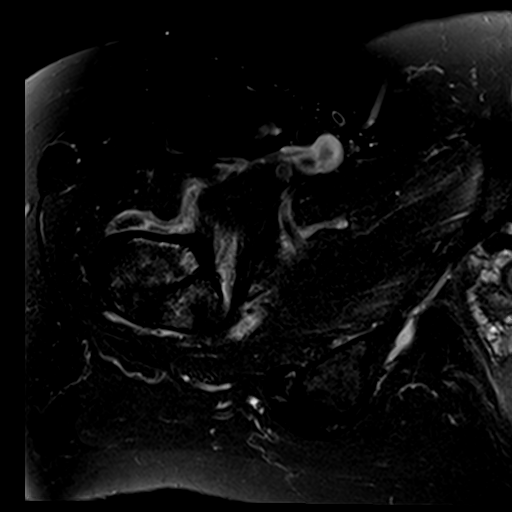
[im 15/30]
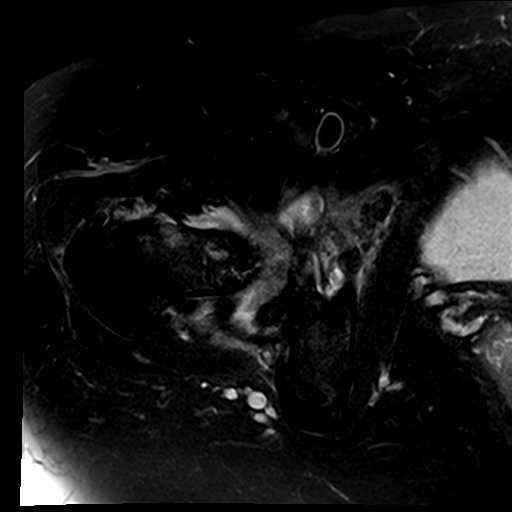
[im 20/30]
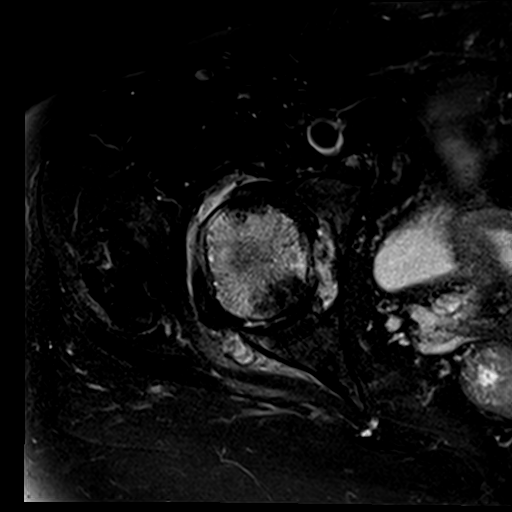
[im 25/30]
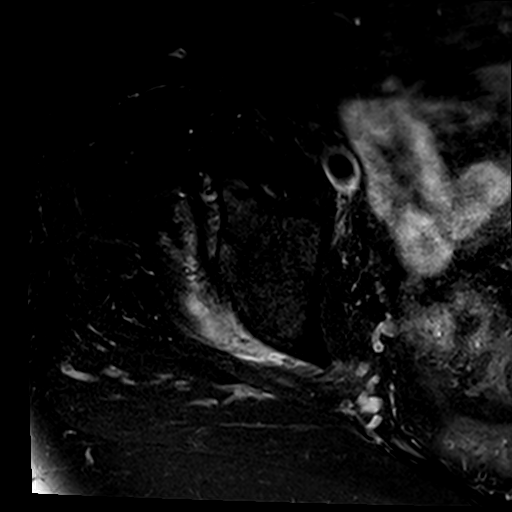
[im 30/30]
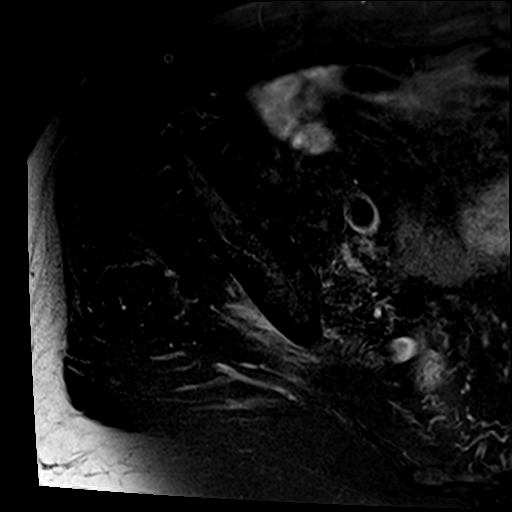

[Series 6: PD fat-sat · sagittal · right · 4.0mm · 0.70mm/px · 7 of 29 slices shown (1 of 2)]
[im 1/29]
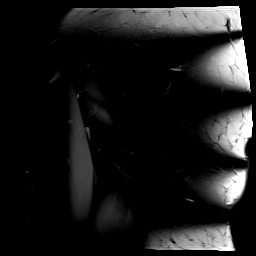
[im 5/29]
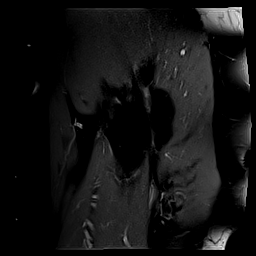
[im 10/29]
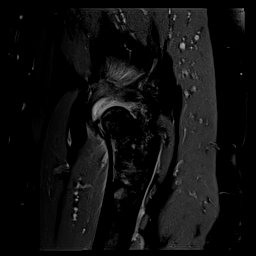
[im 15/29]
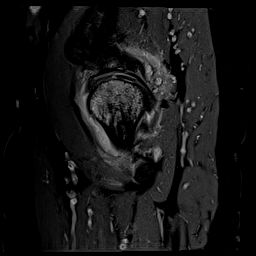
[im 19/29]
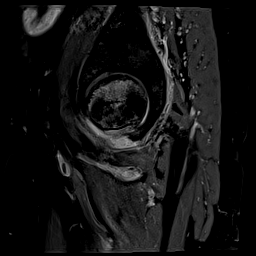
[im 24/29]
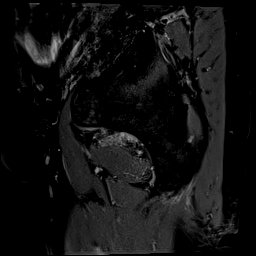
[im 29/29]
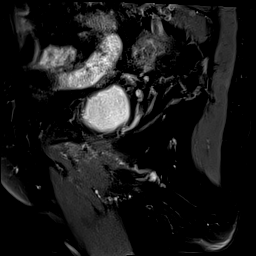

[Series 7: PD fat-sat · coronal · right · 4.0mm · 0.70mm/px · 7 of 29 slices shown (2 of 2)]
[im 1/29]
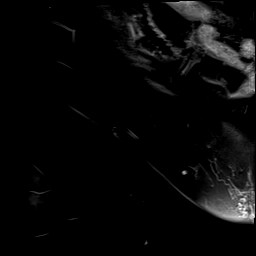
[im 5/29]
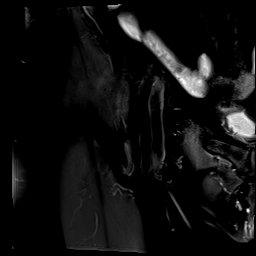
[im 10/29]
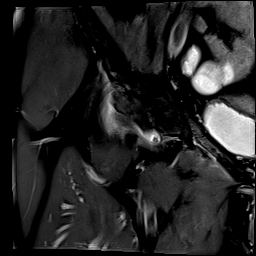
[im 15/29]
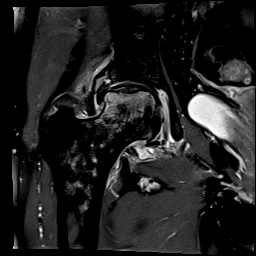
[im 19/29]
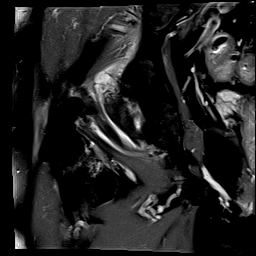
[im 24/29]
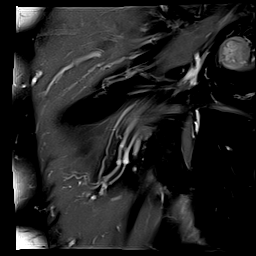
[im 29/29]
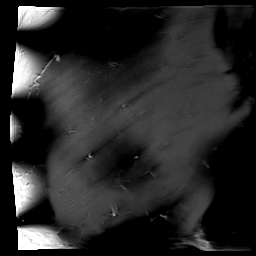

[32 of 40 positions shown; findings below may reference images not displayed]

FINDINGS: Bones: Avascular necrosis of the right femoral head is seen with
secondary flattening of the articular surface. There is intense
edema in the femoral head. Mild, patchy marrow edema is seen in the
right femoral neck and intertrochanteric femur. No avascular
necrosis of the left femoral head is present. Small subchondral
cysts are seen in both the right and left acetabulum. Degenerative
endplate signal change L5-S1 noted.

Articular cartilage and labrum

Articular cartilage:  Markedly degenerated on the right.

Labrum: Extensive tearing is present throughout the labrum. Multi
septated paralabral cyst containing debris along the superior and
posterior right labrum measures approximately 1.3 cm craniocaudal x
4 cm AP x 1.4 cm transverse.

Joint or bursal effusion

Joint effusion: Small joint effusion and synovial thickening on the
right are identified.

Bursae: Negative.

Muscles and tendons

Muscles and tendons:  Intact.

Other findings

Miscellaneous: Imaged intrapelvic contents demonstrate sigmoid
diverticulosis.
IMPRESSION: Avascular necrosis of the right femoral head with secondary
flattening of the superior articular surface and marrow edema in the
head and neck of the femur. Advanced degenerative disease about the
right hip with extensive tearing of the labrum and a large
paralabral cysts are identified as described above.

Mild appearing left hip osteoarthritis.  No AVN on the left.

Sigmoid diverticulosis.

Degenerative endplate signal change L5-S1.

## 2018-11-08 ENCOUNTER — Other Ambulatory Visit: Payer: Self-pay | Admitting: Family Medicine

## 2018-11-08 DIAGNOSIS — M87051 Idiopathic aseptic necrosis of right femur: Secondary | ICD-10-CM

## 2018-11-08 DIAGNOSIS — Z7952 Long term (current) use of systemic steroids: Secondary | ICD-10-CM

## 2018-11-08 DIAGNOSIS — M1611 Unilateral primary osteoarthritis, right hip: Secondary | ICD-10-CM

## 2018-11-11 ENCOUNTER — Ambulatory Visit: Payer: PPO | Admitting: Family Medicine

## 2018-11-11 DIAGNOSIS — M159 Polyosteoarthritis, unspecified: Secondary | ICD-10-CM | POA: Insufficient documentation

## 2018-11-11 DIAGNOSIS — M87051 Idiopathic aseptic necrosis of right femur: Secondary | ICD-10-CM | POA: Diagnosis not present

## 2018-11-11 DIAGNOSIS — M353 Polymyalgia rheumatica: Secondary | ICD-10-CM | POA: Diagnosis not present

## 2018-11-12 ENCOUNTER — Telehealth: Payer: Self-pay

## 2018-11-12 NOTE — Telephone Encounter (Signed)
Left a message on triage line.  1. Wanted to tell Heather Potter that Heather Potter had changed the prednisone to 12.5mg  daily. Then she is to decrease to 10mg  in a week. Goes back in 1 month. Wanted Heather Potter to know.  2. She was asking the status of her referral to Heather Potter. I advised her the referral was sent to his office on 11-08-18. Suggested she call his office to find out the status

## 2018-11-13 NOTE — Telephone Encounter (Signed)
Called and spoke with patient. She is dong ok, awaiting ortho appointment in July. Some relief of hip pain with tramadol and acetaminophen. Will reschedule CPE after ortho appointment.

## 2018-11-19 ENCOUNTER — Other Ambulatory Visit: Payer: Self-pay | Admitting: Family Medicine

## 2018-11-19 DIAGNOSIS — M19042 Primary osteoarthritis, left hand: Secondary | ICD-10-CM

## 2018-11-19 DIAGNOSIS — M19041 Primary osteoarthritis, right hand: Secondary | ICD-10-CM

## 2018-11-19 DIAGNOSIS — M19019 Primary osteoarthritis, unspecified shoulder: Secondary | ICD-10-CM

## 2018-11-19 NOTE — Telephone Encounter (Signed)
Last refilled on 08/20/2018 for #[id number] with 2 refills. LOV 11/02/2018 with Dr. Lorelei Pont for leg pain. No future appointments. Please review.

## 2018-11-24 ENCOUNTER — Telehealth: Payer: Self-pay

## 2018-11-24 NOTE — Telephone Encounter (Signed)
Pt said that she has appt with ortho on 12/01/18 but pain has worsened in rt hip and tramadol 50 mg bid and tylenol is not helping the pain. Pt is wondering if could get stronger pain med until can see ortho. [facility] [address]. Please advise. Pt last seen 11/02/18.

## 2018-11-25 MED ORDER — ACETAMINOPHEN-CODEINE #3 300-30 MG PO TABS: 1.0000 | ORAL_TABLET | Freq: Four times a day (QID) | ORAL | 0 refills | Status: DC | PRN

## 2018-11-25 NOTE — Telephone Encounter (Signed)
With avn very reasonable

## 2018-11-27 ENCOUNTER — Other Ambulatory Visit: Payer: Self-pay | Admitting: Family Medicine

## 2018-11-27 DIAGNOSIS — E039 Hypothyroidism, unspecified: Secondary | ICD-10-CM

## 2018-12-01 DIAGNOSIS — M25551 Pain in right hip: Secondary | ICD-10-CM | POA: Diagnosis not present

## 2018-12-01 DIAGNOSIS — M167 Other unilateral secondary osteoarthritis of hip: Secondary | ICD-10-CM | POA: Diagnosis not present

## 2018-12-09 DIAGNOSIS — M353 Polymyalgia rheumatica: Secondary | ICD-10-CM | POA: Diagnosis not present

## 2018-12-10 ENCOUNTER — Other Ambulatory Visit: Payer: Self-pay | Admitting: Family Medicine

## 2018-12-10 DIAGNOSIS — Z1231 Encounter for screening mammogram for malignant neoplasm of breast: Secondary | ICD-10-CM

## 2018-12-16 ENCOUNTER — Other Ambulatory Visit: Payer: Self-pay

## 2018-12-16 DIAGNOSIS — M19019 Primary osteoarthritis, unspecified shoulder: Secondary | ICD-10-CM

## 2018-12-16 MED ORDER — TRAMADOL HCL 50 MG PO TABS: 50.0000 mg | ORAL_TABLET | Freq: Two times a day (BID) | ORAL | 0 refills | Status: DC | PRN

## 2018-12-16 NOTE — Telephone Encounter (Signed)
Name of Medication:tramadol 50 mg  Name of Pharmacy: [facility] Last Fill or Written Date and Quantity: # 180 on 09/02/18 Last Office Visit and Type: 09/02/18 to discuss pain med and 11/02/18 acute for leg pain Next Office Visit and Type:none  Last Controlled Substance Agreement Date: none Last [id number]  Pt request cb when refill done.

## 2018-12-16 NOTE — Telephone Encounter (Signed)
Noted and reviewed both Debbie and Dr. Lillie Fragmin notes. Refill sent to pharmacy.

## 2018-12-18 ENCOUNTER — Other Ambulatory Visit: Payer: Self-pay

## 2018-12-29 DIAGNOSIS — M87051 Idiopathic aseptic necrosis of right femur: Secondary | ICD-10-CM | POA: Diagnosis not present

## 2018-12-29 DIAGNOSIS — M167 Other unilateral secondary osteoarthritis of hip: Secondary | ICD-10-CM | POA: Diagnosis not present

## 2019-01-03 DIAGNOSIS — M1611 Unilateral primary osteoarthritis, right hip: Secondary | ICD-10-CM | POA: Insufficient documentation

## 2019-01-11 ENCOUNTER — Other Ambulatory Visit: Payer: Self-pay

## 2019-01-11 ENCOUNTER — Encounter: Payer: Self-pay | Admitting: Family Medicine

## 2019-01-11 ENCOUNTER — Ambulatory Visit (INDEPENDENT_AMBULATORY_CARE_PROVIDER_SITE_OTHER): Payer: PPO | Admitting: Family Medicine

## 2019-01-11 VITALS — BP 122/70 | HR 68 | Temp 97.2°F | Ht 62.5 in | Wt 147.8 lb

## 2019-01-11 DIAGNOSIS — M8589 Other specified disorders of bone density and structure, multiple sites: Secondary | ICD-10-CM | POA: Diagnosis not present

## 2019-01-11 DIAGNOSIS — Z905 Acquired absence of kidney: Secondary | ICD-10-CM | POA: Diagnosis not present

## 2019-01-11 DIAGNOSIS — M353 Polymyalgia rheumatica: Secondary | ICD-10-CM | POA: Diagnosis not present

## 2019-01-11 DIAGNOSIS — I10 Essential (primary) hypertension: Secondary | ICD-10-CM | POA: Diagnosis not present

## 2019-01-11 DIAGNOSIS — Z23 Encounter for immunization: Secondary | ICD-10-CM | POA: Diagnosis not present

## 2019-01-11 DIAGNOSIS — E2839 Other primary ovarian failure: Secondary | ICD-10-CM | POA: Diagnosis not present

## 2019-01-11 DIAGNOSIS — E038 Other specified hypothyroidism: Secondary | ICD-10-CM

## 2019-01-11 DIAGNOSIS — Z0000 Encounter for general adult medical examination without abnormal findings: Secondary | ICD-10-CM

## 2019-01-11 DIAGNOSIS — Z01818 Encounter for other preprocedural examination: Secondary | ICD-10-CM

## 2019-01-11 DIAGNOSIS — E785 Hyperlipidemia, unspecified: Secondary | ICD-10-CM | POA: Diagnosis not present

## 2019-01-11 DIAGNOSIS — M159 Polyosteoarthritis, unspecified: Secondary | ICD-10-CM | POA: Diagnosis not present

## 2019-01-11 DIAGNOSIS — M87051 Idiopathic aseptic necrosis of right femur: Secondary | ICD-10-CM | POA: Diagnosis not present

## 2019-01-11 NOTE — Progress Notes (Signed)
Subjective:    Patient ID: Heather Potter, female    DOB: 04/16/1946, 73 y.o.   MRN: [id number]  HPI This is a 73 year old female who presents today for annual physical exam.  She reports that she has been doing okay.  She continues to have pain of her right hip.  She is scheduled for a total hip replacement March 03, 2019.  She thinks that she needs an EKG for preop clearance.  She is not sure of other requirements.  She saw rheumatology today and had blood work.  She is currently on prednisone 7.5 mg for PMR.  Last CPE-08/29/2017 Mammo-10/29/2017 normal, she has upcoming appointment later this week, will add DEXA scan Pap-history of total hysterectomy Colonoscopy-12/19/2010 Tdap-unknown, patient declines Flu-annual, will have today Eye-regular Dental-regular Exercise-she enjoys gardening and does her own yard work as well as housework  Past Medical History:  Diagnosis Date  . Colitis   . History of blood transfusion    D and C and miscarriage  . History of gynecologic surgery    had right ovary removed, unsure about uterus/cervix  . Hyperlipidemia   . Hypertension   . Hypothyroidism   . Lichen planus [id number]   Treated by Dr. Kellie Moor (derm) with methotrexate  . Osteoarthritis   . Osteopenia    Past Surgical History:  Procedure Laterality Date  . APPENDECTOMY    . [place]   colectomy for ? Diverticulits  . DILATION AND CURETTAGE OF UTERUS    . ESOPHAGEAL MANOMETRY N/A 08/21/2015   Procedure: ESOPHAGEAL MANOMETRY (EM);  Surgeon: Mauri Pole, MD;  Location: [facility];  Service: Endoscopy;  Laterality: N/A;  . HERNIA REPAIR     right lower abd  . 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 OOPHORECTOMY     right  . OTHER SURGICAL HISTORY     part of intestine removed  . TUBAL LIGATION     bilat   Family History  Problem Relation Age of Onset  . Pancreatic cancer Mother   . Heart attack Father   . Liver cancer Brother   . Colon  cancer Neg Hx   . Breast cancer Neg Hx    Social History   Tobacco Use  . Smoking status: Former Smoker    Quit date: 05/21/2007    Years since quitting: 11.6  . Smokeless tobacco: Never Used  Substance Use Topics  . Alcohol use: Yes    Comment: rare- wine  . Drug use: No      Review of Systems  Constitutional: Negative.   HENT: Negative.   Eyes: Negative.   Respiratory: Negative.   Cardiovascular: Negative.   Gastrointestinal: Negative.   Endocrine: Negative.   Genitourinary: Negative.   Musculoskeletal: Positive for arthralgias (right hip, shoulders) and joint swelling (hands, chronic).  Skin: Negative.   Allergic/Immunologic: Negative.   Neurological: Negative.   Hematological: Negative.   Psychiatric/Behavioral: Negative.        Objective:   Physical Exam Vitals signs reviewed.  Constitutional:      General: She is not in acute distress.    Appearance: Normal appearance. She is normal weight. She is not ill-appearing, toxic-appearing or diaphoretic.  HENT:     Head: Normocephalic and atraumatic.     Right Ear: Tympanic membrane, ear canal and external ear normal.     Left Ear: Tympanic membrane, ear canal and external ear normal.     Nose: Nose normal.  Mouth/Throat:     Mouth: Mucous membranes are moist.     Pharynx: Oropharynx is clear.  Eyes:     Extraocular Movements: Extraocular movements intact.     Conjunctiva/sclera: Conjunctivae normal.     Pupils: Pupils are equal, round, and reactive to light.  Neck:     Musculoskeletal: Normal range of motion and neck supple. No neck rigidity or muscular tenderness.  Cardiovascular:     Rate and Rhythm: Normal rate and regular rhythm.     Heart sounds: Normal heart sounds.  Pulmonary:     Effort: Pulmonary effort is normal.     Breath sounds: Normal breath sounds.  Chest:     Breasts: Breasts are symmetrical.        Right: Normal.        Left: Normal.  Abdominal:     General: Abdomen is flat. Bowel  sounds are normal. There is no distension.     Palpations: Abdomen is soft. There is no mass.     Tenderness: There is no abdominal tenderness. There is no guarding or rebound.     Hernia: No hernia is present.  Musculoskeletal:        General: Deformity (bilateral DIP/PIP) present. No swelling or tenderness.     Right lower leg: No edema.     Left lower leg: No edema.  Lymphadenopathy:     Cervical: No cervical adenopathy.  Skin:    General: Skin is warm and dry.  Neurological:     Mental Status: She is alert and oriented to person, place, and time.  Psychiatric:        Mood and Affect: Mood normal.        Behavior: Behavior normal.        Thought Content: Thought content normal.        Judgment: Judgment normal.       BP 122/70 (BP Location: Left Arm, Patient Position: Sitting, Cuff Size: Normal)   Pulse 68   Temp (!) 97.2 F (36.2 C) (Temporal)   Ht 5' 2.5" (1.588 m)   Wt 147 lb 12.8 oz (67 kg)   SpO2 98%   BMI 26.60 kg/m  Wt Readings from Last 3 Encounters:  01/11/19 147 lb 12.8 oz (67 kg)  11/02/18 148 lb 12 oz (67.5 kg)  09/30/18 145 lb 8 oz (66 kg)   Depression screen [facility] 2/9 01/11/2019 09/07/2018 08/26/2017 08/23/2016 07/06/2015  Decreased Interest 0 0 1 2 0  Down, Depressed, Hopeless 0 0 1 1 0  PHQ - 2 Score 0 0 2 3 0  Altered sleeping - 0 1 0 -  Tired, decreased energy - 0 1 1 -  Change in appetite - 0 0 0 -  Feeling bad or failure about yourself  - 0 0 0 -  Trouble concentrating - 0 0 0 -  Moving slowly or fidgety/restless - 0 0 0 -  Suicidal thoughts - 0 0 0 -  PHQ-9 Score - 0 4 4 -  Difficult doing work/chores - Not difficult at all Somewhat difficult Not difficult at all -   EKG- sinus rhythm, occasional PAC, low voltage in limb leads, unchanged from 11/29/2010    Assessment & Plan:  1. Annual physical exam - Discussed and encouraged healthy lifestyle choices- adequate sleep, regular exercise, stress management and healthy food choices.  -She just had  labs drawn today in rheumatology and I will see what additional labs she needs once these are resulted  2. Pre-op  testing -We will order additional tests as needed, chose to get EKG while she was in the office - EKG 12-Lead  3. Estrogen deficiency -We will see if we can add to mammogram appointment later this week - DG Bone Density; Future  4. Need for influenza vaccination - Flu Vaccine QUAD High Dose(Fluad)  -Follow-up in 6 months Clarene Reamer, FNP-BC  [facility], [place]  01/11/2019 6:05 PM

## 2019-01-11 NOTE — Progress Notes (Signed)
Subjective:    Patient ID: Heather Potter, female    DOB: [birth year], 73 y.o.   MRN: [id number] Chief Complaint  Patient presents with  . Annual Exam    Total hip replacement is scheduled for 03/03/2019.    HPI  Pt arrives to the office for her annual physical. Pt reports she has a scheduled right hip surgery in October and it makes her a little anxious. Her pain is worse when she sits down. She tries to stay active by gardening and walking her dog daily She sleeps in recliner that helps to reduce her pain during the night.  She also reports bilateral shoulder pain. She saw rheumatologist earlier today who increased her prednisone from 7.5 mg to 10 mg for the next 2 days.  Otherwise, pt does not have any additional concerns or questions.     Past Medical History:  Diagnosis Date  . Colitis   . History of blood transfusion    D and C and miscarriage  . History of gynecologic surgery    had right ovary removed, unsure about uterus/cervix  . Hyperlipidemia   . Hypertension   . Hypothyroidism   . Lichen planus [id number]   Treated by Dr. Kellie Moor (derm) with methotrexate  . Osteoarthritis   . Osteopenia     Past Surgical History:  Procedure Laterality Date  . APPENDECTOMY    . [place]   colectomy for ? Diverticulits  . DILATION AND CURETTAGE OF UTERUS    . ESOPHAGEAL MANOMETRY N/A 08/21/2015   Procedure: ESOPHAGEAL MANOMETRY (EM);  Surgeon: Mauri Pole, MD;  Location: [facility];  Service: Endoscopy;  Laterality: N/A;  . HERNIA REPAIR     right lower abd  . 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 OOPHORECTOMY     right  . OTHER SURGICAL HISTORY     part of intestine removed  . TUBAL LIGATION     bilat      Review of Systems  Constitutional: Positive for fatigue. Negative for activity change, appetite change, chills and fever.  HENT: Positive for tinnitus. Negative for congestion, dental problem, ear discharge, ear pain, facial  swelling, hearing loss, mouth sores, nosebleeds, rhinorrhea, sinus pressure, sinus pain and sore throat.   Eyes: Negative for pain, discharge and redness.  Respiratory: Negative for apnea, cough, chest tightness, shortness of breath and wheezing.   Cardiovascular: Negative for chest pain, palpitations and leg swelling.  Gastrointestinal: Negative for abdominal distention, abdominal pain, blood in stool, constipation, diarrhea, nausea and vomiting.  Endocrine: Negative for cold intolerance, heat intolerance and polyuria.  Genitourinary: Negative for difficulty urinating, flank pain, frequency, genital sores, pelvic pain, urgency and vaginal pain.  Musculoskeletal: Negative for arthralgias, back pain, gait problem, joint swelling, neck pain and neck stiffness.  Skin: Negative.   Allergic/Immunologic: Negative for environmental allergies.  Neurological: Negative for dizziness, tremors, facial asymmetry, speech difficulty, weakness, light-headedness, numbness and headaches.  Psychiatric/Behavioral: Negative.        Objective:   Physical Exam Constitutional:      Appearance: Normal appearance. She is normal weight.  HENT:     Head: Normocephalic.     Right Ear: Tympanic membrane, ear canal and external ear normal.     Left Ear: Tympanic membrane, ear canal and external ear normal.     Nose: Nose normal. No congestion or rhinorrhea.     Mouth/Throat:     Mouth: Mucous membranes are moist.  Pharynx: Oropharynx is clear. No oropharyngeal exudate or posterior oropharyngeal erythema.  Eyes:     Extraocular Movements: Extraocular movements intact.     Conjunctiva/sclera: Conjunctivae normal.     Pupils: Pupils are equal, round, and reactive to light.  Neck:     Musculoskeletal: Normal range of motion.  Cardiovascular:     Rate and Rhythm: Normal rate and regular rhythm.     Pulses: Normal pulses.     Heart sounds: Normal heart sounds.  Pulmonary:     Effort: Pulmonary effort is normal.  No respiratory distress.     Breath sounds: Normal breath sounds. No wheezing, rhonchi or rales.  Chest:     Chest wall: No mass or tenderness.     Breasts:        Right: Inverted nipple present. No swelling, nipple discharge, skin change or tenderness.        Left: Inverted nipple present. No swelling, nipple discharge, skin change or tenderness.  Abdominal:     General: Bowel sounds are normal. There is no distension.     Palpations: Abdomen is soft.     Tenderness: There is no abdominal tenderness. There is no guarding.  Musculoskeletal:        General: Tenderness present.     Right lower leg: No edema.     Left lower leg: No edema.     Comments: Bilateral shoulder tenderness  Lymphadenopathy:     Upper Body:     Right upper body: No supraclavicular or axillary adenopathy.     Left upper body: No supraclavicular or axillary adenopathy.  Skin:    General: Skin is warm and dry.     Findings: Rash present.     Comments: Chigger bites bilateral lower extremities  Neurological:     General: No focal deficit present.     Mental Status: She is alert and oriented to person, place, and time. Mental status is at baseline.  Psychiatric:        Mood and Affect: Mood normal.        Behavior: Behavior normal.        Thought Content: Thought content normal.        Judgment: Judgment normal.           Assessment & Plan:  1. Pre-op testing EKG is done and reviewed. - EKG 12-Lead  2. Estrogen deficiency  Recommended screening due to the age. Last bone density scan was done in 2017.  - DG Bone Density; Future  3. Need for influenza vaccination Annual influenza vaccination  - Flu vaccine HIGH DOSE PF (Fluzone High dose)

## 2019-01-11 NOTE — Patient Instructions (Signed)
Good to see you today!! Follow up in 6-7 months  Good luck with surgery   Health Maintenance After Age 73 After age 90, you are at a higher risk for certain long-term diseases and infections as well as injuries from falls. Falls are a major cause of broken bones and head injuries in people who are older than age 23. Getting regular preventive care can help to keep you healthy and well. Preventive care includes getting regular testing and making lifestyle changes as recommended by your health care provider. Talk with your health care provider about:  Which screenings and tests you should have. A screening is a test that checks for a disease when you have no symptoms.  A diet and exercise plan that is right for you. What should I know about screenings and tests to prevent falls? Screening and testing are the best ways to find a health problem early. Early diagnosis and treatment give you the best chance of managing medical conditions that are common after age 45. Certain conditions and lifestyle choices may make you more likely to have a fall. Your health care provider may recommend:  Regular vision checks. Poor vision and conditions such as cataracts can make you more likely to have a fall. If you wear glasses, make sure to get your prescription updated if your vision changes.  Medicine review. Work with your health care provider to regularly review all of the medicines you are taking, including over-the-counter medicines. Ask your health care provider about any side effects that may make you more likely to have a fall. Tell your health care provider if any medicines that you take make you feel dizzy or sleepy.  Osteoporosis screening. Osteoporosis is a condition that causes the bones to get weaker. This can make the bones weak and cause them to break more easily.  Blood pressure screening. Blood pressure changes and medicines to control blood pressure can make you feel dizzy.  Strength and  balance checks. Your health care provider may recommend certain tests to check your strength and balance while standing, walking, or changing positions.  Foot health exam. Foot pain and numbness, as well as not wearing proper footwear, can make you more likely to have a fall.  Depression screening. You may be more likely to have a fall if you have a fear of falling, feel emotionally low, or feel unable to do activities that you used to do.  Alcohol use screening. Using too much alcohol can affect your balance and may make you more likely to have a fall. What actions can I take to lower my risk of falls? General instructions  Talk with your health care provider about your risks for falling. Tell your health care provider if: ? You fall. Be sure to tell your health care provider about all falls, even ones that seem minor. ? You feel dizzy, sleepy, or off-balance.  Take over-the-counter and prescription medicines only as told by your health care provider. These include any supplements.  Eat a healthy diet and maintain a healthy weight. A healthy diet includes low-fat dairy products, low-fat (lean) meats, and fiber from whole grains, beans, and lots of fruits and vegetables. Home safety  Remove any tripping hazards, such as rugs, cords, and clutter.  Install safety equipment such as grab bars in bathrooms and safety rails on stairs.  Keep rooms and walkways well-lit. Activity   Follow a regular exercise program to stay fit. This will help you maintain your balance. Ask your health  care provider what types of exercise are appropriate for you.  If you need a cane or walker, use it as recommended by your health care provider.  Wear supportive shoes that have nonskid soles. Lifestyle  Do not drink alcohol if your health care provider tells you not to drink.  If you drink alcohol, limit how much you have: ? 0-1 drink a day for women. ? 0-2 drinks a day for men.  Be aware of how much  alcohol is in your drink. In the [place] one drink equals one typical bottle of beer (12 oz), one-half glass of wine (5 oz), or one shot of hard liquor (1 oz).  Do not use any products that contain nicotine or tobacco, such as cigarettes and e-cigarettes. If you need help quitting, ask your health care provider. Summary  Having a healthy lifestyle and getting preventive care can help to protect your health and wellness after age 54.  Screening and testing are the best way to find a health problem early and help you avoid having a fall. Early diagnosis and treatment give you the best chance for managing medical conditions that are more common for people who are older than age 9.  Falls are a major cause of broken bones and head injuries in people who are older than age 57. Take precautions to prevent a fall at home.  Work with your health care provider to learn what changes you can make to improve your health and wellness and to prevent falls. This information is not intended to replace advice given to you by your health care provider. Make sure you discuss any questions you have with your health care provider. Document Released: 03/19/2017 Document Revised: 08/27/2018 Document Reviewed: 03/19/2017 Elsevier Patient Education  2020 [employer].

## 2019-01-14 ENCOUNTER — Ambulatory Visit
Admission: RE | Admit: 2019-01-14 | Discharge: 2019-01-14 | Disposition: A | Discharge status: Home / Self Care | Payer: PPO | Source: Ambulatory Visit | Attending: Family Medicine | Admitting: Family Medicine

## 2019-01-14 DIAGNOSIS — Z1231 Encounter for screening mammogram for malignant neoplasm of breast: Secondary | ICD-10-CM | POA: Insufficient documentation

## 2019-01-14 DIAGNOSIS — E2839 Other primary ovarian failure: Secondary | ICD-10-CM

## 2019-01-14 DIAGNOSIS — M8588 Other specified disorders of bone density and structure, other site: Secondary | ICD-10-CM | POA: Diagnosis not present

## 2019-01-14 IMAGING — MG DIGITAL SCREENING BILATERAL MAMMOGRAM WITH TOMO AND CAD
6 of 10 series · 6 of 30 positions shown · non-contrast
Comparison: Previous exam(s).

CLINICAL DATA: Screening.

EXAM:
DIGITAL SCREENING BILATERAL MAMMOGRAM WITH TOMO AND CAD

[R MLO synth-2D (1 of 2)]
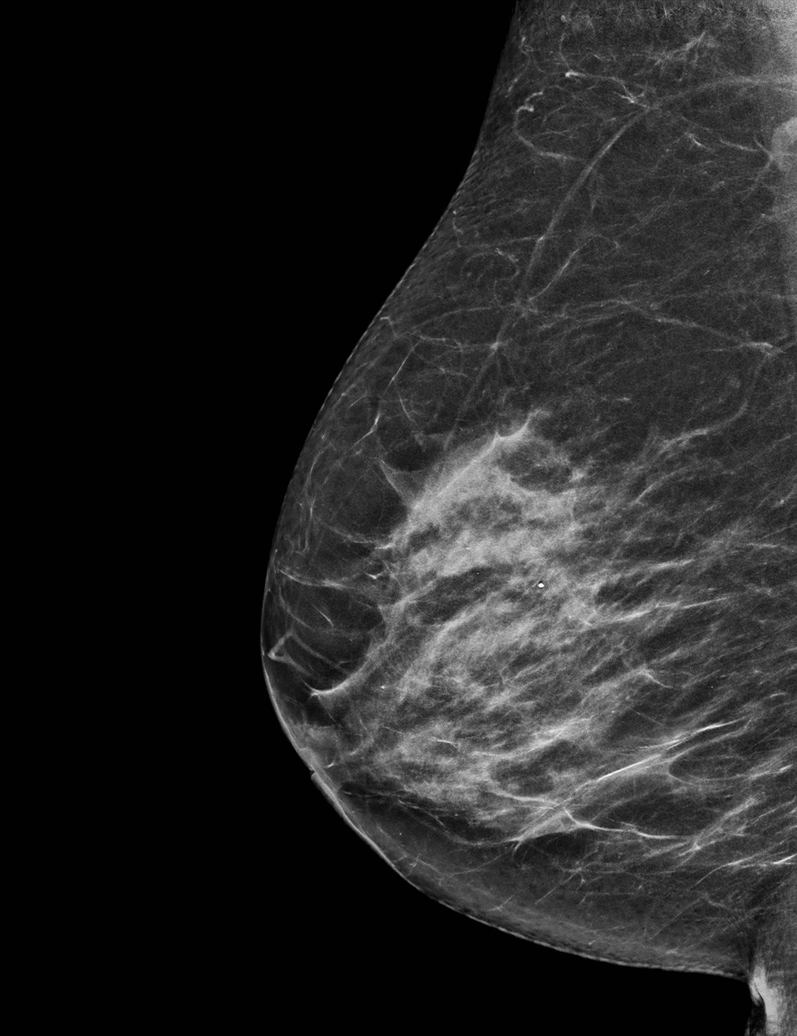

[R CC synth-2D]
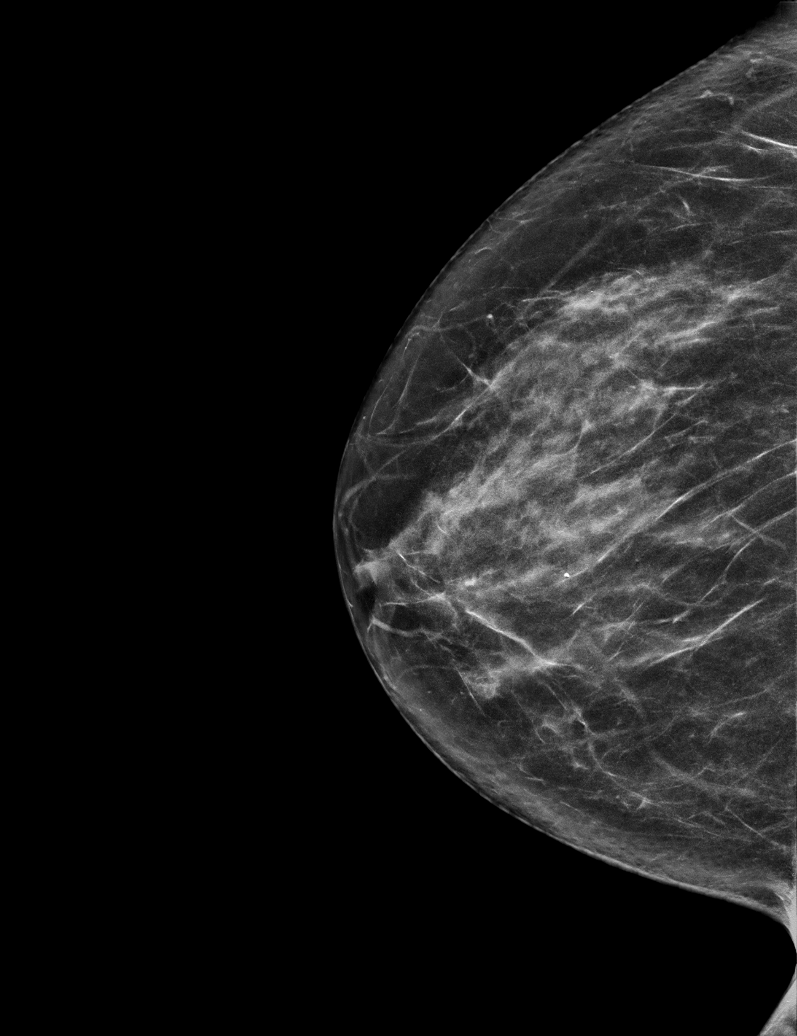

[L CC synth-2D]
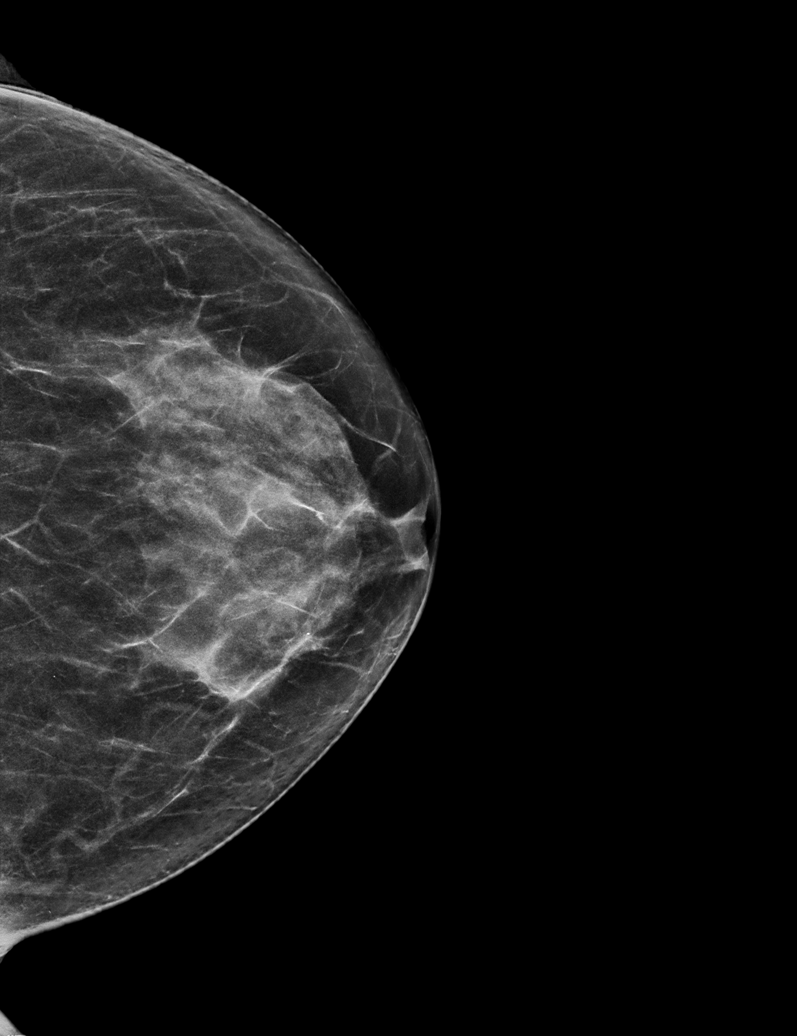

[L MLO synth-2D]
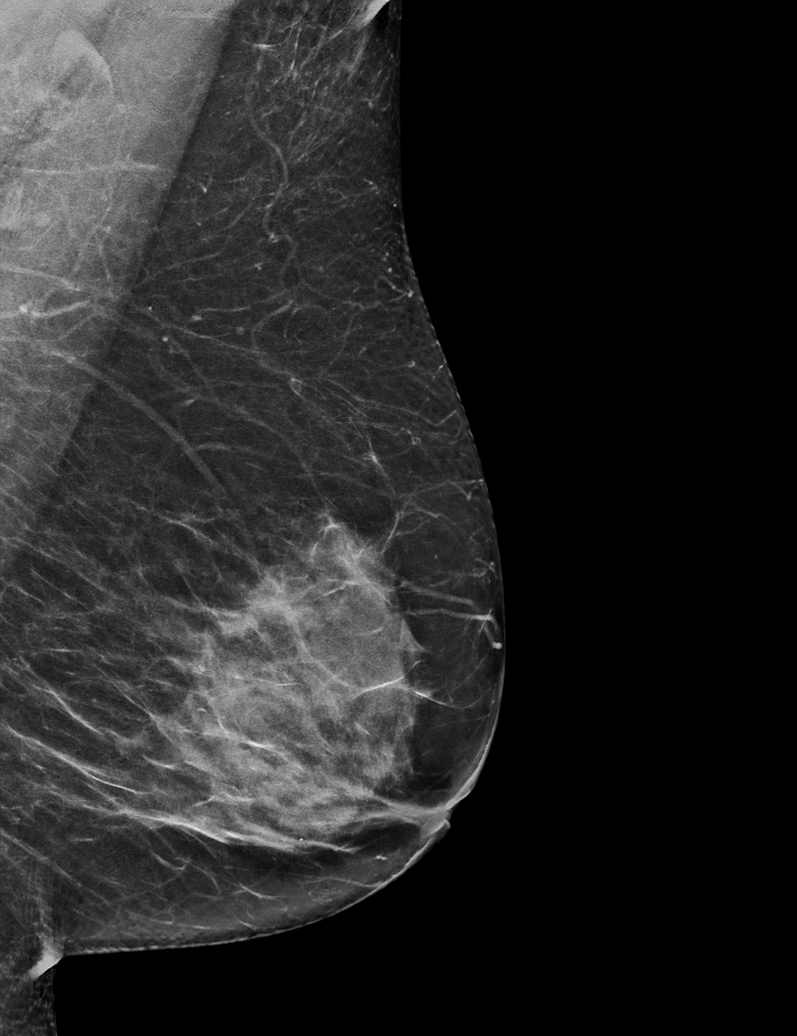

[R MLO synth-2D (2 of 2)]
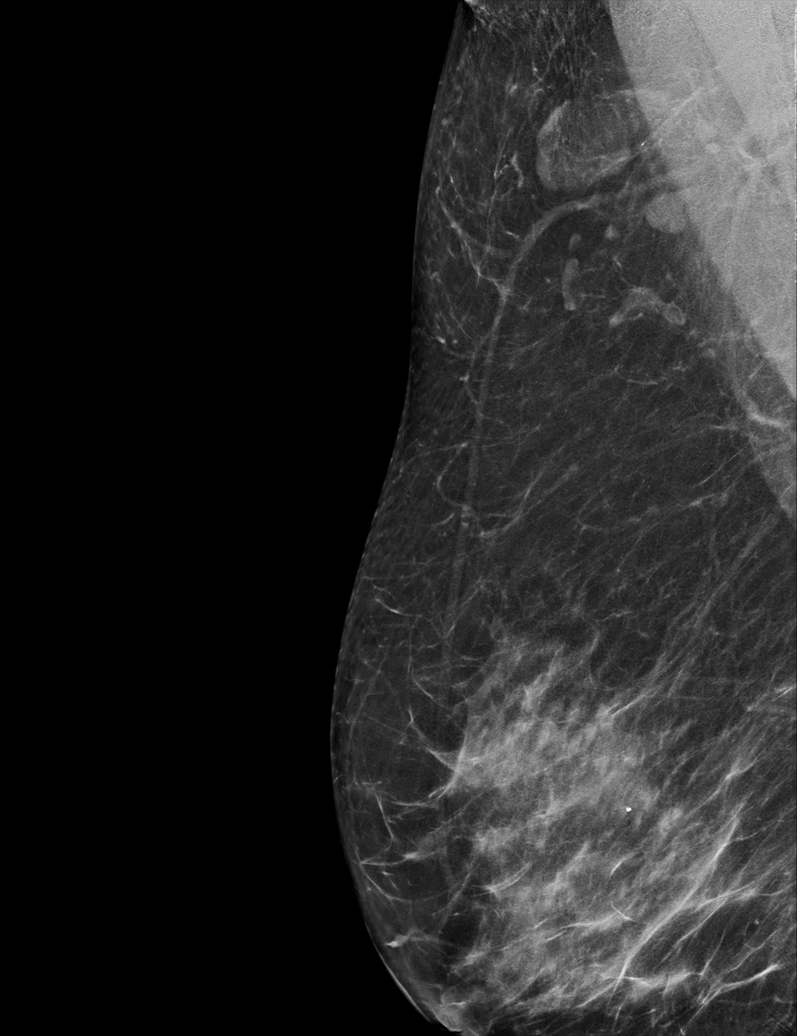

[R MLO tomo · tomo slice 37/74.0]
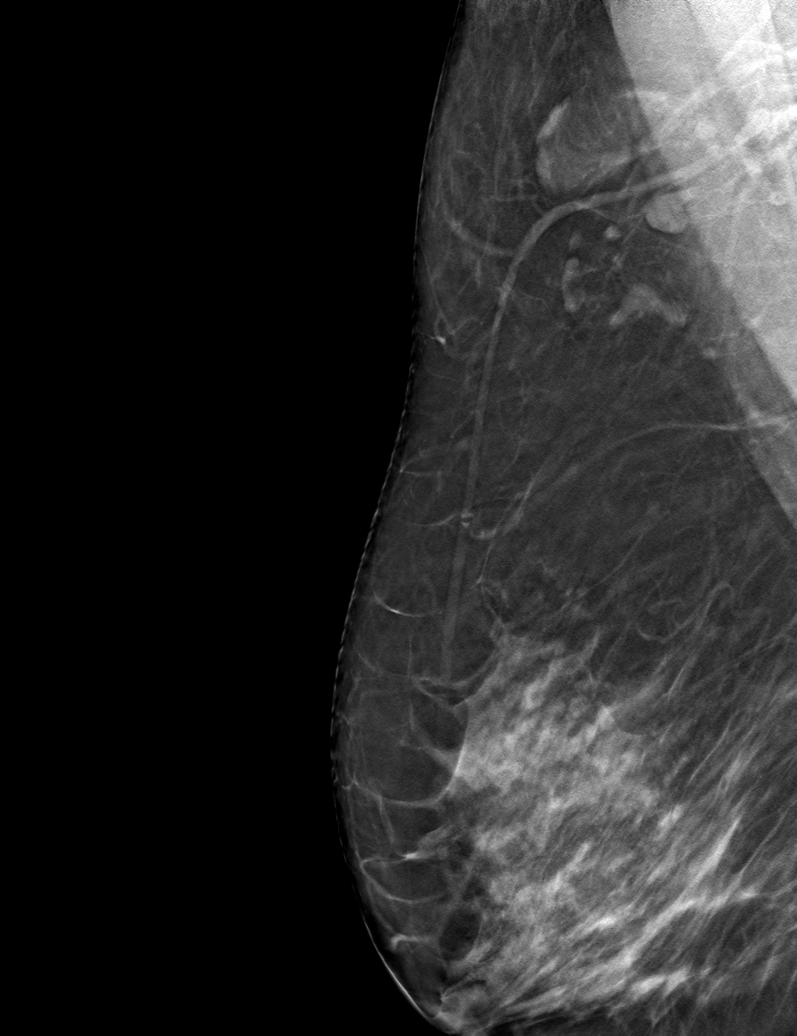

[6 of 30 positions shown; findings below may reference images not displayed]

ACR Breast Density Category c: The breast tissue is heterogeneously
dense, which may obscure small masses.
FINDINGS: There are no findings suspicious for malignancy. Images were
processed with CAD.
IMPRESSION: No mammographic evidence of malignancy. A result letter of this
screening mammogram will be mailed directly to the patient.

RECOMMENDATION:
Screening mammogram in one year. (Code:[5V])

BI-RADS CATEGORY  1: Negative.

## 2019-01-27 ENCOUNTER — Other Ambulatory Visit: Payer: Self-pay

## 2019-01-27 DIAGNOSIS — M19019 Primary osteoarthritis, unspecified shoulder: Secondary | ICD-10-CM

## 2019-01-27 NOTE — Telephone Encounter (Signed)
Name of Medication:tramadol 50 mg  Name of [facility]  Last Fill or Written Date and Quantity:# 54 on 12/16/18  Last Office Visit and Type: 01/11/19 annual exam Next Office Visit and Type: 07/14/2019 for 6 mth FU Last Controlled Substance Agreement Date: none Last [id number]

## 2019-01-29 MED ORDER — TRAMADOL HCL 50 MG PO TABS: 50.0000 mg | ORAL_TABLET | Freq: Two times a day (BID) | ORAL | 0 refills | Status: DC | PRN

## 2019-02-03 NOTE — Addendum Note (Signed)
Addended by: Clarene Reamer B on: 02/03/2019 05:34 PM   Modules accepted: Orders

## 2019-02-08 NOTE — Telephone Encounter (Signed)
Pt calling to ck on status of tramadol refill from [facility]; pt has not heard from our office or the pharmacy. I spoke with pt and advised Glenda Chroman FNP sent refill of tramadol to [facility] electronically on 01/29/19. Pt voiced understanding and will ck status of refill with pharmacy. Pt will cb if needed.

## 2019-02-10 ENCOUNTER — Other Ambulatory Visit (INDEPENDENT_AMBULATORY_CARE_PROVIDER_SITE_OTHER): Payer: PPO

## 2019-02-10 DIAGNOSIS — E2839 Other primary ovarian failure: Secondary | ICD-10-CM

## 2019-02-10 DIAGNOSIS — M8589 Other specified disorders of bone density and structure, multiple sites: Secondary | ICD-10-CM | POA: Diagnosis not present

## 2019-02-10 DIAGNOSIS — E785 Hyperlipidemia, unspecified: Secondary | ICD-10-CM

## 2019-02-10 DIAGNOSIS — Z905 Acquired absence of kidney: Secondary | ICD-10-CM | POA: Diagnosis not present

## 2019-02-10 DIAGNOSIS — M353 Polymyalgia rheumatica: Secondary | ICD-10-CM | POA: Diagnosis not present

## 2019-02-10 DIAGNOSIS — I10 Essential (primary) hypertension: Secondary | ICD-10-CM

## 2019-02-10 DIAGNOSIS — E039 Hypothyroidism, unspecified: Secondary | ICD-10-CM

## 2019-02-10 DIAGNOSIS — Z79899 Other long term (current) drug therapy: Secondary | ICD-10-CM | POA: Diagnosis not present

## 2019-02-10 DIAGNOSIS — E038 Other specified hypothyroidism: Secondary | ICD-10-CM

## 2019-02-10 LAB — BASIC METABOLIC PANEL
BUN: 16 mg/dL (ref 6–23)
CO2: 30 mEq/L (ref 19–32)
Calcium: 10.5 mg/dL (ref 8.4–10.5)
Chloride: 103 mEq/L (ref 96–112)
Creatinine, Ser: 0.89 mg/dL (ref 0.40–1.20)
GFR: 62.09 mL/min (ref >60.00)
Glucose, Bld: 109 mg/dL — ABNORMAL HIGH (ref 70–99)
Potassium: 4.2 mEq/L (ref 3.5–5.1)
Sodium: 143 mEq/L (ref 135–145)

## 2019-02-10 LAB — CBC WITH DIFFERENTIAL/PLATELET
Basophils Absolute: 0.1 10*3/uL (ref 0.0–0.1)
Basophils Relative: 1.2 % (ref 0.0–3.0)
Eosinophils Absolute: 0 10*3/uL (ref 0.0–0.7)
Eosinophils Relative: 0.2 % (ref 0.0–5.0)
HCT: 39.9 % (ref 36.0–46.0)
Hemoglobin: 12.8 g/dL (ref 12.0–15.0)
Lymphocytes Relative: 11.3 % — ABNORMAL LOW (ref 12.0–46.0)
Lymphs Abs: 1.3 10*3/uL (ref 0.7–4.0)
MCHC: 32 g/dL (ref 30.0–36.0)
MCV: 90.9 fl (ref 78.0–100.0)
Monocytes Absolute: 0.6 10*3/uL (ref 0.1–1.0)
Monocytes Relative: 5.1 % (ref 3.0–12.0)
Neutro Abs: 9.2 10*3/uL — ABNORMAL HIGH (ref 1.4–7.7)
Neutrophils Relative %: 82.2 % — ABNORMAL HIGH (ref 43.0–77.0)
Platelets: 307 10*3/uL (ref 150.0–400.0)
RBC: 4.39 Mil/uL (ref 3.87–5.11)
RDW: 14.9 % (ref 11.5–15.5)
WBC: 11.2 10*3/uL — ABNORMAL HIGH (ref 4.0–10.5)

## 2019-02-10 LAB — LIPID PANEL
Cholesterol: 207 mg/dL — ABNORMAL HIGH (ref 0–200)
HDL: 60.5 mg/dL (ref >39.00)
LDL Cholesterol: 127 mg/dL — ABNORMAL HIGH (ref 0–99)
NonHDL: 146.26
Total CHOL/HDL Ratio: 3
Triglycerides: 98 mg/dL (ref 0.0–149.0)
VLDL: 19.6 mg/dL (ref 0.0–40.0)

## 2019-02-10 LAB — TSH: TSH: 5.53 u[IU]/mL — ABNORMAL HIGH (ref 0.35–4.50)

## 2019-02-10 LAB — HEMOGLOBIN A1C: Hgb A1c MFr Bld: 5.9 % (ref 4.6–6.5)

## 2019-02-10 LAB — VITAMIN D 25 HYDROXY (VIT D DEFICIENCY, FRACTURES): VITD: 35.85 ng/mL (ref 30.00–100.00)

## 2019-02-10 MED ORDER — LEVOTHYROXINE SODIUM 100 MCG PO TABS: 100.0000 ug | ORAL_TABLET | Freq: Every day | ORAL | 0 refills | Status: DC

## 2019-02-10 NOTE — Addendum Note (Signed)
Addended by: Clarene Reamer B on: 02/10/2019 04:40 PM   Modules accepted: Orders

## 2019-02-12 ENCOUNTER — Other Ambulatory Visit: Payer: PPO

## 2019-02-21 NOTE — Discharge Instructions (Signed)
Instructions after Total Hip Replacement ° ° °  Frankie Zito P. Teira Arcilla, Jr., M.D.    ° Dept. of Orthopaedics & Sports Medicine ° [facility] ° [address] ° [place], [place]  [address] ° Phone: [phone]   Fax: [phone] ° °  °DIET: °• Drink plenty of non-alcoholic fluids. °• Resume your normal diet. Include foods high in fiber. ° °ACTIVITY:  °• You may use crutches or a walker with weight-bearing as tolerated, unless instructed otherwise. °• You may be weaned off of the walker or crutches by your Physical Therapist.  °• Do NOT reach below the level of your knees or cross your legs until allowed.    °• Continue doing gentle exercises. Exercising will reduce the pain and swelling, increase motion, and prevent muscle weakness.   °• Please continue to use the TED compression stockings for 6 weeks. You may remove the stockings at night, but should reapply them in the morning. °• Do not drive or operate any equipment until instructed. ° °WOUND CARE:  °• Continue to use ice packs periodically to reduce pain and swelling. °• Keep the incision clean and dry. °• You may bathe or shower after the staples are removed at the first office visit following surgery. ° °MEDICATIONS: °• You may resume your regular medications. °• Please take the pain medication as prescribed on the medication. °• Do not take pain medication on an empty stomach. °• You have been given a prescription for a blood thinner to prevent blood clots. Please take the medication as instructed. (NOTE: After completing a 2 week course of Lovenox, take one Enteric-coated aspirin once a day.) °• Pain medications and iron supplements can cause constipation. Use a stool softener (Senokot or Colace) on a daily basis and a laxative (dulcolax or miralax) as needed. °• Do not drive or drink alcoholic beverages when taking pain medications. ° °CALL THE OFFICE FOR: °• Temperature above 101 degrees °• Excessive bleeding or drainage on the dressing. °• Excessive  swelling, coldness, or paleness of the toes. °• Persistent nausea and vomiting. ° °FOLLOW-UP:  °• You should have an appointment to return to the office in 6 weeks after surgery. °• Arrangements have been made for continuation of Physical Therapy (either home therapy or outpatient therapy). °  °

## 2019-02-23 ENCOUNTER — Other Ambulatory Visit: Payer: Self-pay

## 2019-02-23 ENCOUNTER — Encounter
Admission: RE | Admit: 2019-02-23 | Discharge: 2019-02-23 | Disposition: A | Discharge status: Home / Self Care | Payer: PPO | Source: Ambulatory Visit | Attending: Orthopedic Surgery | Admitting: Orthopedic Surgery

## 2019-02-23 ENCOUNTER — Other Ambulatory Visit: Payer: PPO

## 2019-02-23 DIAGNOSIS — Z01818 Encounter for other preprocedural examination: Secondary | ICD-10-CM | POA: Insufficient documentation

## 2019-02-23 LAB — URINALYSIS, ROUTINE W REFLEX MICROSCOPIC
Bacteria, UA: NONE SEEN
Bilirubin Urine: NEGATIVE
Glucose, UA: NEGATIVE mg/dL
Ketones, ur: NEGATIVE mg/dL
Leukocytes,Ua: NEGATIVE
Nitrite: NEGATIVE
Protein, ur: NEGATIVE mg/dL
Specific Gravity, Urine: 1.008 (ref 1.005–1.030)
Squamous Epithelial / HPF: NONE SEEN (ref 0–5)
WBC, UA: NONE SEEN WBC/hpf (ref 0–5)
pH: 6 (ref 5.0–8.0)

## 2019-02-23 LAB — SEDIMENTATION RATE: Sed Rate: 46 mm/hr — ABNORMAL HIGH (ref 0–30)

## 2019-02-23 LAB — PROTIME-INR
INR: 1 (ref 0.8–1.2)
Prothrombin Time: 13.1 seconds (ref 11.4–15.2)

## 2019-02-23 LAB — C-REACTIVE PROTEIN: CRP: 2.3 mg/dL — ABNORMAL HIGH (ref <1.0)

## 2019-02-23 LAB — TYPE AND SCREEN
ABO/RH(D): O NEG
Antibody Screen: NEGATIVE

## 2019-02-23 LAB — SURGICAL PCR SCREEN
MRSA, PCR: NEGATIVE
Staphylococcus aureus: POSITIVE — AB

## 2019-02-23 LAB — APTT: aPTT: 32 seconds (ref 24–36)

## 2019-02-23 NOTE — Patient Instructions (Addendum)
Your procedure is scheduled on: Wednesday 03/03/19.  Report to DAY SURGERY DEPARTMENT LOCATED ON 2ND FLOOR MEDICAL MALL ENTRANCE. To find out your arrival time please call [phone] between 1PM - 3PM on Tuesday 03/02/19.   Remember: Instructions that are not followed completely may result in serious medical risk, up to and including death, or upon the discretion of your surgeon and anesthesiologist your surgery may need to be rescheduled.      _X__ 1. Do not eat food after midnight the night before your procedure.                 No gum chewing or hard candies. You may drink clear liquids up to 2 hours                 before you are scheduled to arrive for your surgery- DO NOT drink clear                 liquids within 2 hours of the start of your surgery.                 Clear Liquids include:  water, apple juice without pulp, clear carbohydrate                 drink such as Clearfast or Gatorade, Black Coffee or Tea (Do not add                 milk or creamer to coffee or tea).  ** Dr. Marry Guan would like for you to finish the Pre-Surgery Ensure 2 hours before your arrival time on your surgery date.**  __X__2.  On the morning of surgery brush your teeth with toothpaste and water, you may rinse your mouth with mouthwash if you wish.  Do not swallow any toothpaste or mouthwash.       _X__ 3.  No Alcohol for 24 hours before or after surgery.     __X__4.  Notify your doctor if there is any change in your medical condition      (cold, fever, infections).       Do not wear jewelry, make-up, hairpins, clips or nail polish. Do not wear lotions, powders, or perfumes.  Do not shave 48 hours prior to surgery. Men may shave face and neck. Do not bring valuables to the hospital.      [facility] is not responsible for any belongings or valuables.   Contacts, dentures/partials or body piercings may not be worn into surgery. Bring a case for your contacts, glasses or hearing aids, a  denture cup will be supplied.     Patients discharged the day of surgery will not be allowed to drive home.     Please read over the following fact sheets that you were given:   MRSA Information    __X__ Take these medicines the morning of surgery with A SIP OF WATER:     1. amLODipine (NORVASC) 10 MG tablet  2. gabapentin (NEURONTIN) 100 MG capsule  3. levothyroxine (SYNTHROID) 100 MCG tablet  4. predniSONE (DELTASONE) 4 MG  5. simvastatin (ZOCOR) 20 MG tablet  6. traMADol (ULTRAM) 50 MG tablet     __X__ Use CHG Soap as directed    __X__ Stop Blood Thinners: Aspirin. Your last dose was today.   __X__ Stop Anti-inflammatories 7 days before surgery such as Advil, Ibuprofen, Motrin, BC or Goodies Powder, Naprosyn, Naproxen, Aleve, Aspirin, Meloxicam. May take Tylenol, acetaminophen-codeine (TYLENOL #3) 300-30 MG tablet, or traMADol (ULTRAM)  50 MG tablet  if needed for pain or discomfort.    __X__ Stop taking your Turmuric 7 days prior to your procedure. Your last dose was today. Do not start taking any new herbal supplements before your procedure.

## 2019-02-24 LAB — URINE CULTURE
Culture: NO GROWTH
Special Requests: NORMAL

## 2019-02-25 DIAGNOSIS — L438 Other lichen planus: Secondary | ICD-10-CM | POA: Diagnosis not present

## 2019-02-25 DIAGNOSIS — L821 Other seborrheic keratosis: Secondary | ICD-10-CM | POA: Diagnosis not present

## 2019-02-26 ENCOUNTER — Other Ambulatory Visit
Admission: RE | Admit: 2019-02-26 | Discharge: 2019-02-26 | Disposition: A | Discharge status: Home / Self Care | Payer: PPO | Source: Ambulatory Visit | Attending: Orthopedic Surgery | Admitting: Orthopedic Surgery

## 2019-02-26 ENCOUNTER — Other Ambulatory Visit: Payer: Self-pay

## 2019-02-26 DIAGNOSIS — Z01812 Encounter for preprocedural laboratory examination: Secondary | ICD-10-CM | POA: Diagnosis not present

## 2019-02-26 DIAGNOSIS — Z20828 Contact with and (suspected) exposure to other viral communicable diseases: Secondary | ICD-10-CM | POA: Insufficient documentation

## 2019-02-26 LAB — SARS CORONAVIRUS 2 (TAT 6-24 HRS): SARS Coronavirus 2: NEGATIVE

## 2019-03-01 DIAGNOSIS — R0602 Shortness of breath: Secondary | ICD-10-CM | POA: Diagnosis not present

## 2019-03-01 DIAGNOSIS — I5022 Chronic systolic (congestive) heart failure: Secondary | ICD-10-CM | POA: Diagnosis not present

## 2019-03-02 ENCOUNTER — Encounter: Payer: Self-pay | Admitting: Orthopedic Surgery

## 2019-03-02 MED ORDER — TRANEXAMIC ACID-NACL 1000-0.7 MG/100ML-% IV SOLN
1000.0000 mg | INTRAVENOUS | Status: AC
  Administered 2019-03-03: 1000 mg via INTRAVENOUS
  Filled 2019-03-02: qty 100

## 2019-03-03 ENCOUNTER — Inpatient Hospital Stay: Payer: PPO | Admitting: Certified Registered Nurse Anesthetist

## 2019-03-03 ENCOUNTER — Inpatient Hospital Stay: Payer: PPO

## 2019-03-03 ENCOUNTER — Inpatient Hospital Stay
Admission: RE | Admit: 2019-03-03 | Discharge: 2019-03-05 | DRG: 470 | Disposition: A | Discharge status: Home Health | Payer: PPO | Attending: Orthopedic Surgery | Admitting: Orthopedic Surgery

## 2019-03-03 ENCOUNTER — Encounter
Admission: RE | Disposition: A | Discharge status: Home Health | Payer: Self-pay | Source: Home / Self Care | Attending: Orthopedic Surgery

## 2019-03-03 ENCOUNTER — Other Ambulatory Visit: Payer: Self-pay

## 2019-03-03 DIAGNOSIS — E785 Hyperlipidemia, unspecified: Secondary | ICD-10-CM | POA: Diagnosis not present

## 2019-03-03 DIAGNOSIS — Z79890 Hormone replacement therapy (postmenopausal): Secondary | ICD-10-CM | POA: Diagnosis not present

## 2019-03-03 DIAGNOSIS — M353 Polymyalgia rheumatica: Secondary | ICD-10-CM | POA: Diagnosis not present

## 2019-03-03 DIAGNOSIS — T380X5A Adverse effect of glucocorticoids and synthetic analogues, initial encounter: Secondary | ICD-10-CM | POA: Diagnosis present

## 2019-03-03 DIAGNOSIS — Z87891 Personal history of nicotine dependence: Secondary | ICD-10-CM

## 2019-03-03 DIAGNOSIS — Z7952 Long term (current) use of systemic steroids: Secondary | ICD-10-CM

## 2019-03-03 DIAGNOSIS — Z8261 Family history of arthritis: Secondary | ICD-10-CM | POA: Diagnosis not present

## 2019-03-03 DIAGNOSIS — Z800 Family history of malignant neoplasm of digestive organs: Secondary | ICD-10-CM

## 2019-03-03 DIAGNOSIS — E039 Hypothyroidism, unspecified: Secondary | ICD-10-CM | POA: Diagnosis not present

## 2019-03-03 DIAGNOSIS — Z524 Kidney donor: Secondary | ICD-10-CM

## 2019-03-03 DIAGNOSIS — M879 Osteonecrosis, unspecified: Secondary | ICD-10-CM | POA: Diagnosis present

## 2019-03-03 DIAGNOSIS — M87151 Osteonecrosis due to drugs, right femur: Principal | ICD-10-CM | POA: Diagnosis present

## 2019-03-03 DIAGNOSIS — R2689 Other abnormalities of gait and mobility: Secondary | ICD-10-CM | POA: Diagnosis not present

## 2019-03-03 DIAGNOSIS — I10 Essential (primary) hypertension: Secondary | ICD-10-CM | POA: Diagnosis present

## 2019-03-03 DIAGNOSIS — K5730 Diverticulosis of large intestine without perforation or abscess without bleeding: Secondary | ICD-10-CM | POA: Diagnosis present

## 2019-03-03 DIAGNOSIS — M1611 Unilateral primary osteoarthritis, right hip: Secondary | ICD-10-CM | POA: Diagnosis not present

## 2019-03-03 DIAGNOSIS — Z96649 Presence of unspecified artificial hip joint: Secondary | ICD-10-CM

## 2019-03-03 DIAGNOSIS — Z96641 Presence of right artificial hip joint: Secondary | ICD-10-CM | POA: Diagnosis not present

## 2019-03-03 DIAGNOSIS — Z7982 Long term (current) use of aspirin: Secondary | ICD-10-CM

## 2019-03-03 DIAGNOSIS — M8580 Other specified disorders of bone density and structure, unspecified site: Secondary | ICD-10-CM | POA: Diagnosis not present

## 2019-03-03 DIAGNOSIS — M87051 Idiopathic aseptic necrosis of right femur: Secondary | ICD-10-CM | POA: Diagnosis not present

## 2019-03-03 DIAGNOSIS — Z471 Aftercare following joint replacement surgery: Secondary | ICD-10-CM | POA: Diagnosis not present

## 2019-03-03 DIAGNOSIS — Z8249 Family history of ischemic heart disease and other diseases of the circulatory system: Secondary | ICD-10-CM | POA: Diagnosis not present

## 2019-03-03 HISTORY — PX: TOTAL HIP ARTHROPLASTY: SHX124

## 2019-03-03 LAB — ABO/RH: ABO/RH(D): O NEG

## 2019-03-03 IMAGING — DX DG HIP (WITH OR WITHOUT PELVIS) 1V PORT*R*
2 series · 2 of 2 positions shown · non-contrast
Comparison: [DATE]

CLINICAL DATA: Status post hip arthroplasty.

EXAM:
DG HIP (WITH OR WITHOUT PELVIS) 1V PORT RIGHT

[pelvis ap]
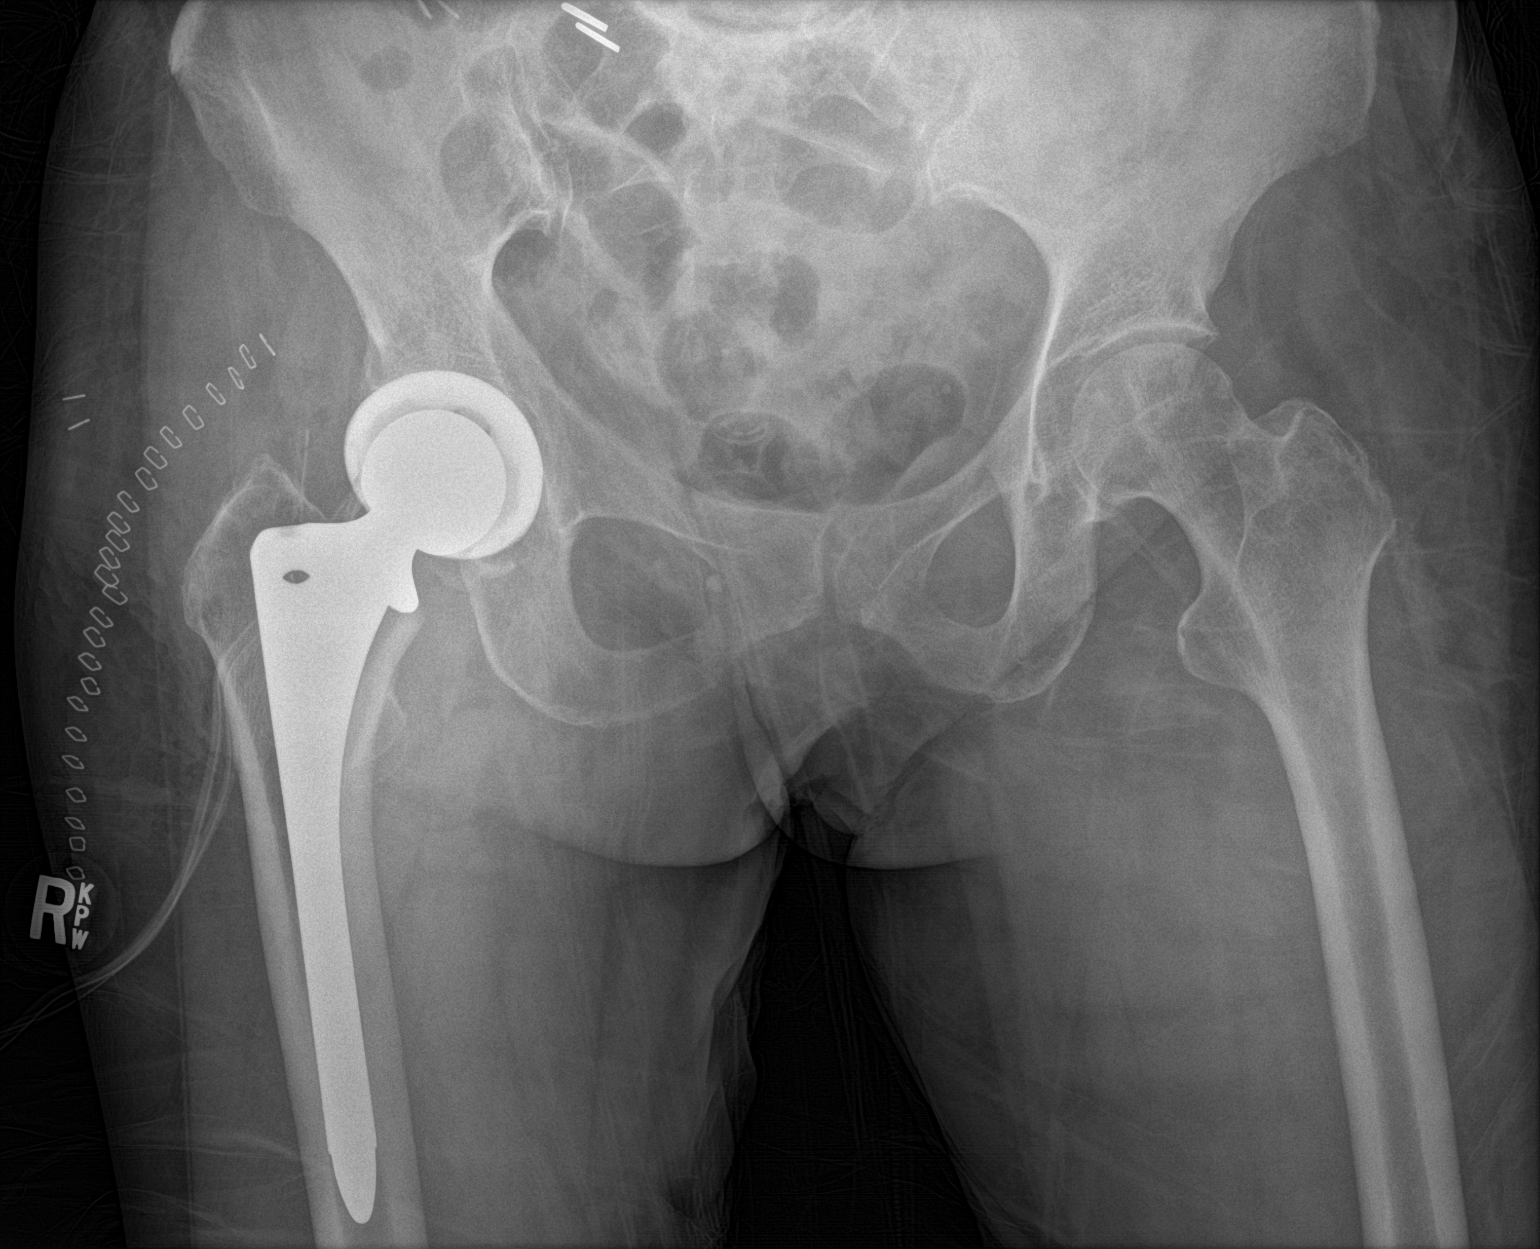

[hip lat]
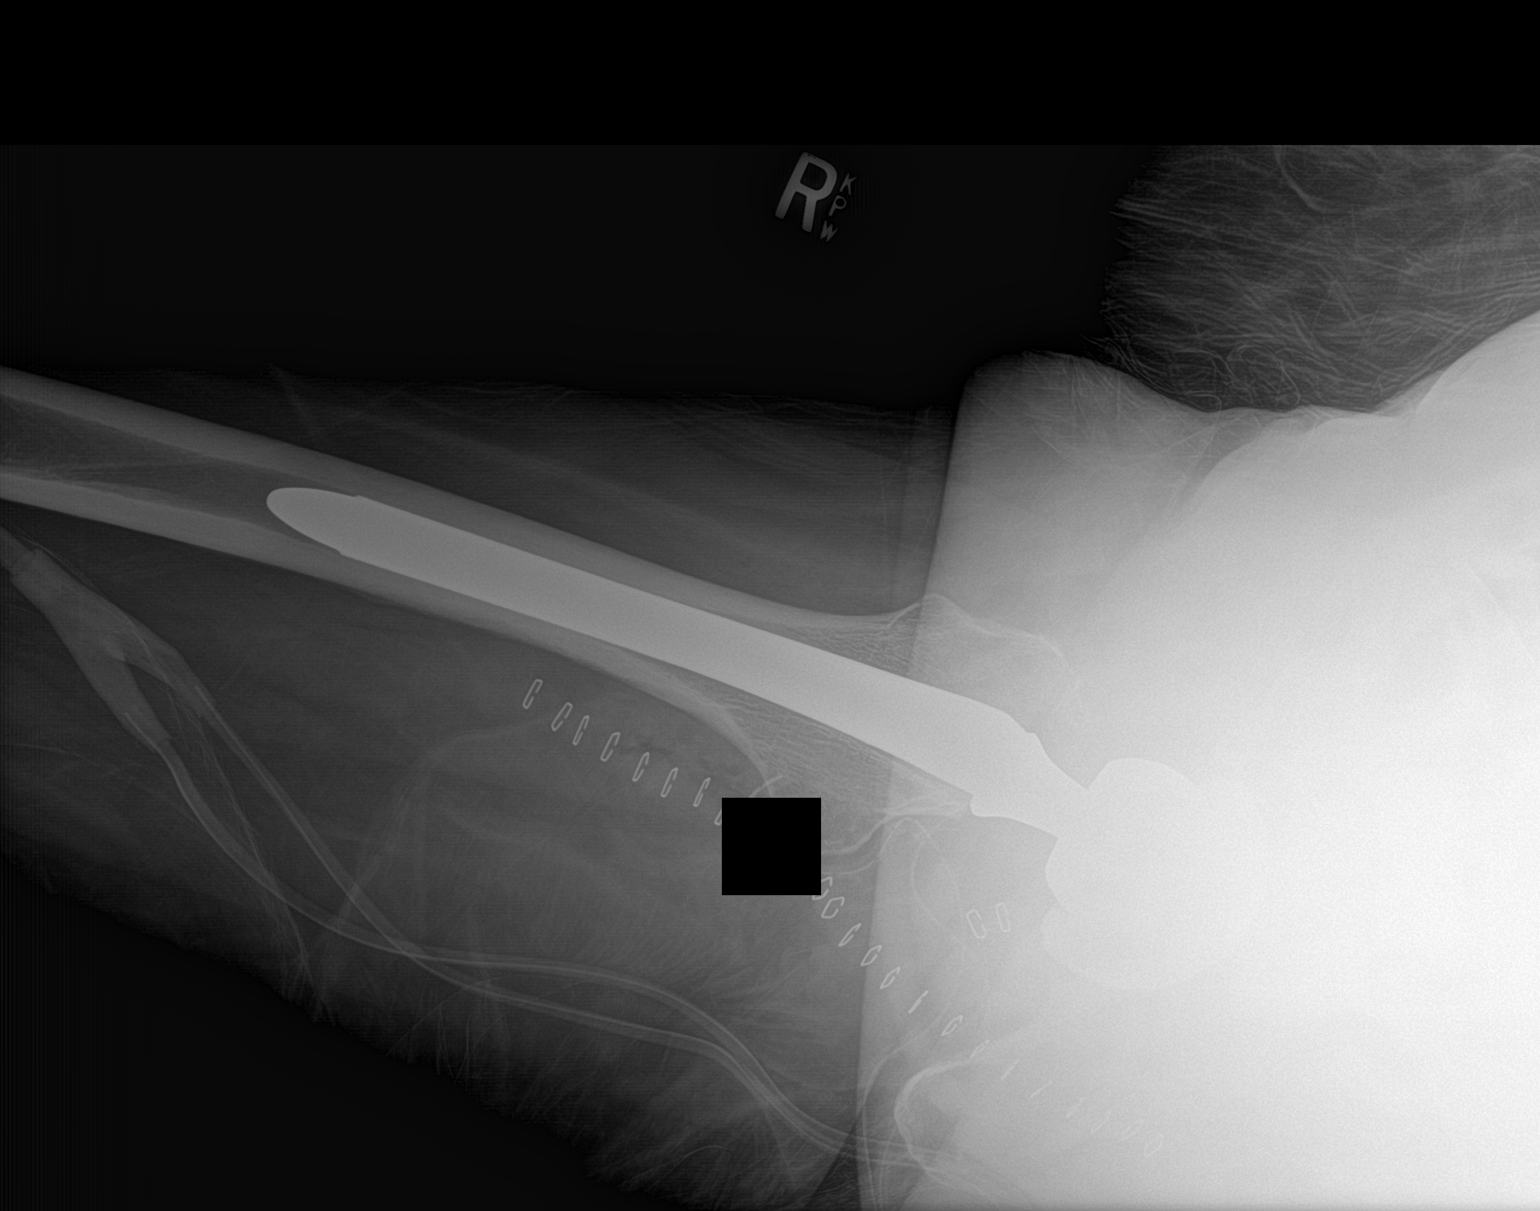

[2 of 2 positions shown; findings below may reference images not displayed]

FINDINGS: Two views of the right hip demonstrate a right hip arthroplasty. The
right hip appears to be located without complicating features. The
entire right femoral prosthesis is visualized. Evidence for surgical
drain and skin staples. Visualized pelvic bony ring is intact.
Normal appearance of the left hip.
IMPRESSION: Right hip arthroplasty without complicating features.

## 2019-03-03 SURGERY — ARTHROPLASTY, HIP, TOTAL,POSTERIOR APPROACH
Anesthesia: General | Site: Hip | Laterality: Right

## 2019-03-03 MED ORDER — ONDANSETRON HCL 4 MG/2ML IJ SOLN: 4.0000 mg | Freq: Once | INTRAMUSCULAR | Status: DC | PRN

## 2019-03-03 MED ORDER — PHENYLEPHRINE HCL (PRESSORS) 10 MG/ML IV SOLN
INTRAVENOUS | Status: AC
  Filled 2019-03-03: qty 1

## 2019-03-03 MED ORDER — ACETAMINOPHEN 10 MG/ML IV SOLN
1000.0000 mg | Freq: Four times a day (QID) | INTRAVENOUS | Status: AC
  Administered 2019-03-03 – 2019-03-04 (×4): 1000 mg via INTRAVENOUS
  Filled 2019-03-03 (×4): qty 100

## 2019-03-03 MED ORDER — CELECOXIB 200 MG PO CAPS
400.0000 mg | ORAL_CAPSULE | Freq: Once | ORAL | Status: AC
  Administered 2019-03-03: 07:00:00 400 mg via ORAL

## 2019-03-03 MED ORDER — GABAPENTIN 300 MG PO CAPS
300.0000 mg | ORAL_CAPSULE | Freq: Once | ORAL | Status: AC
  Administered 2019-03-03: 07:00:00 300 mg via ORAL

## 2019-03-03 MED ORDER — SENNOSIDES-DOCUSATE SODIUM 8.6-50 MG PO TABS
1.0000 | ORAL_TABLET | Freq: Two times a day (BID) | ORAL | Status: DC
  Administered 2019-03-03 – 2019-03-05 (×5): 1 via ORAL
  Filled 2019-03-03 (×5): qty 1

## 2019-03-03 MED ORDER — ENOXAPARIN SODIUM 30 MG/0.3ML ~~LOC~~ SOLN
30.0000 mg | Freq: Two times a day (BID) | SUBCUTANEOUS | Status: DC
  Administered 2019-03-03 – 2019-03-05 (×4): 30 mg via SUBCUTANEOUS
  Filled 2019-03-03 (×4): qty 0.3

## 2019-03-03 MED ORDER — OXYCODONE HCL 5 MG PO TABS: 10.0000 mg | ORAL_TABLET | ORAL | Status: DC | PRN

## 2019-03-03 MED ORDER — ENSURE PRE-SURGERY PO LIQD
296.0000 mL | Freq: Once | ORAL | Status: DC
  Filled 2019-03-03: qty 296

## 2019-03-03 MED ORDER — ROCURONIUM BROMIDE 100 MG/10ML IV SOLN
INTRAVENOUS | Status: DC | PRN
  Administered 2019-03-03: 50 mg via INTRAVENOUS
  Administered 2019-03-03: 20 mg via INTRAVENOUS
  Administered 2019-03-03: 30 mg via INTRAVENOUS

## 2019-03-03 MED ORDER — MIDAZOLAM HCL 2 MG/2ML IJ SOLN
INTRAMUSCULAR | Status: AC
  Filled 2019-03-03: qty 2

## 2019-03-03 MED ORDER — GABAPENTIN 300 MG PO CAPS
ORAL_CAPSULE | ORAL | Status: AC
  Administered 2019-03-03: 300 mg via ORAL
  Filled 2019-03-03: qty 1

## 2019-03-03 MED ORDER — DEXAMETHASONE SODIUM PHOSPHATE 10 MG/ML IJ SOLN
INTRAMUSCULAR | Status: AC
  Administered 2019-03-03: 8 mg via INTRAVENOUS
  Filled 2019-03-03: qty 1

## 2019-03-03 MED ORDER — CELECOXIB 200 MG PO CAPS
200.0000 mg | ORAL_CAPSULE | Freq: Two times a day (BID) | ORAL | Status: DC
  Administered 2019-03-03 – 2019-03-05 (×4): 200 mg via ORAL
  Filled 2019-03-03 (×4): qty 1

## 2019-03-03 MED ORDER — CEFAZOLIN SODIUM-DEXTROSE 2-4 GM/100ML-% IV SOLN
2.0000 g | Freq: Four times a day (QID) | INTRAVENOUS | Status: AC
  Administered 2019-03-03 – 2019-03-04 (×4): 2 g via INTRAVENOUS
  Filled 2019-03-03 (×4): qty 100

## 2019-03-03 MED ORDER — LIDOCAINE HCL (CARDIAC) PF 100 MG/5ML IV SOSY
PREFILLED_SYRINGE | INTRAVENOUS | Status: DC | PRN
  Administered 2019-03-03: 80 mg via INTRAVENOUS

## 2019-03-03 MED ORDER — FAMOTIDINE 20 MG PO TABS
20.0000 mg | ORAL_TABLET | Freq: Once | ORAL | Status: AC
  Administered 2019-03-03: 07:00:00 20 mg via ORAL

## 2019-03-03 MED ORDER — SODIUM CHLORIDE 0.9 % IV SOLN: INTRAVENOUS | Status: DC | PRN

## 2019-03-03 MED ORDER — PHENYLEPHRINE HCL (PRESSORS) 10 MG/ML IV SOLN
INTRAVENOUS | Status: DC | PRN
  Administered 2019-03-03: 50 ug via INTRAVENOUS
  Administered 2019-03-03: 100 ug via INTRAVENOUS

## 2019-03-03 MED ORDER — LACTATED RINGERS IV SOLN
INTRAVENOUS | Status: DC
  Administered 2019-03-03: 07:00:00 via INTRAVENOUS

## 2019-03-03 MED ORDER — SODIUM CHLORIDE 0.9 % IV SOLN
INTRAVENOUS | Status: DC
  Administered 2019-03-03 – 2019-03-04 (×2): via INTRAVENOUS

## 2019-03-03 MED ORDER — PREDNISONE 1 MG PO TABS
3.0000 mg | ORAL_TABLET | Freq: Every day | ORAL | Status: DC
  Administered 2019-03-04 – 2019-03-05 (×2): 3 mg via ORAL
  Filled 2019-03-03 (×2): qty 3

## 2019-03-03 MED ORDER — FERROUS SULFATE 325 (65 FE) MG PO TABS
325.0000 mg | ORAL_TABLET | Freq: Two times a day (BID) | ORAL | Status: DC
  Administered 2019-03-03 – 2019-03-05 (×4): 325 mg via ORAL
  Filled 2019-03-03 (×4): qty 1

## 2019-03-03 MED ORDER — FENTANYL CITRATE (PF) 100 MCG/2ML IJ SOLN
INTRAMUSCULAR | Status: AC
  Administered 2019-03-03: 25 ug via INTRAVENOUS
  Filled 2019-03-03: qty 2

## 2019-03-03 MED ORDER — MAGNESIUM HYDROXIDE 400 MG/5ML PO SUSP
30.0000 mL | Freq: Every day | ORAL | Status: DC
  Administered 2019-03-03 – 2019-03-04 (×2): 30 mL via ORAL
  Filled 2019-03-03 (×2): qty 30

## 2019-03-03 MED ORDER — OXYCODONE HCL 5 MG PO TABS: 5.0000 mg | ORAL_TABLET | ORAL | Status: DC | PRN

## 2019-03-03 MED ORDER — GABAPENTIN 100 MG PO CAPS
100.0000 mg | ORAL_CAPSULE | Freq: Two times a day (BID) | ORAL | Status: DC
  Administered 2019-03-03 – 2019-03-05 (×4): 100 mg via ORAL
  Filled 2019-03-03 (×4): qty 1

## 2019-03-03 MED ORDER — METHYLPREDNISOLONE SODIUM SUCC 125 MG IJ SOLR
INTRAMUSCULAR | Status: DC | PRN
  Administered 2019-03-03: 125 mg via INTRAVENOUS

## 2019-03-03 MED ORDER — SEVOFLURANE IN SOLN
RESPIRATORY_TRACT | Status: AC
  Filled 2019-03-03: qty 250

## 2019-03-03 MED ORDER — METOCLOPRAMIDE HCL 10 MG PO TABS
10.0000 mg | ORAL_TABLET | Freq: Three times a day (TID) | ORAL | Status: AC
  Administered 2019-03-03 – 2019-03-05 (×8): 10 mg via ORAL
  Filled 2019-03-03 (×8): qty 1

## 2019-03-03 MED ORDER — SUGAMMADEX SODIUM 200 MG/2ML IV SOLN
INTRAVENOUS | Status: AC
  Filled 2019-03-03: qty 2

## 2019-03-03 MED ORDER — METOCLOPRAMIDE HCL 10 MG PO TABS: 5.0000 mg | ORAL_TABLET | Freq: Three times a day (TID) | ORAL | Status: DC | PRN

## 2019-03-03 MED ORDER — EPHEDRINE SULFATE 50 MG/ML IJ SOLN
INTRAMUSCULAR | Status: AC
  Filled 2019-03-03: qty 1

## 2019-03-03 MED ORDER — CYCLOBENZAPRINE HCL 10 MG PO TABS: 5.0000 mg | ORAL_TABLET | Freq: Every evening | ORAL | Status: DC | PRN

## 2019-03-03 MED ORDER — METOCLOPRAMIDE HCL 5 MG/ML IJ SOLN: 5.0000 mg | Freq: Three times a day (TID) | INTRAMUSCULAR | Status: DC | PRN

## 2019-03-03 MED ORDER — CEFAZOLIN SODIUM-DEXTROSE 2-4 GM/100ML-% IV SOLN
INTRAVENOUS | Status: AC
  Filled 2019-03-03: qty 100

## 2019-03-03 MED ORDER — ONDANSETRON HCL 4 MG/2ML IJ SOLN
INTRAMUSCULAR | Status: AC
  Filled 2019-03-03: qty 2

## 2019-03-03 MED ORDER — PROPOFOL 10 MG/ML IV BOLUS
INTRAVENOUS | Status: DC | PRN
  Administered 2019-03-03: 120 mg via INTRAVENOUS

## 2019-03-03 MED ORDER — ONDANSETRON HCL 4 MG/2ML IJ SOLN: 4.0000 mg | Freq: Four times a day (QID) | INTRAMUSCULAR | Status: DC | PRN

## 2019-03-03 MED ORDER — ONDANSETRON HCL 4 MG/2ML IJ SOLN
INTRAMUSCULAR | Status: DC | PRN
  Administered 2019-03-03: 4 mg via INTRAVENOUS

## 2019-03-03 MED ORDER — SIMVASTATIN 10 MG PO TABS
10.0000 mg | ORAL_TABLET | Freq: Every day | ORAL | Status: DC
  Administered 2019-03-04: 10 mg via ORAL
  Filled 2019-03-03 (×2): qty 1

## 2019-03-03 MED ORDER — CHOLECALCIFEROL 10 MCG (400 UNIT) PO TABS
400.0000 [IU] | ORAL_TABLET | Freq: Every day | ORAL | Status: DC
  Administered 2019-03-03 – 2019-03-04 (×2): 400 [IU] via ORAL
  Filled 2019-03-03 (×3): qty 1

## 2019-03-03 MED ORDER — FENTANYL CITRATE (PF) 100 MCG/2ML IJ SOLN
INTRAMUSCULAR | Status: DC | PRN
  Administered 2019-03-03: 50 ug via INTRAVENOUS
  Administered 2019-03-03 (×2): 25 ug via INTRAVENOUS

## 2019-03-03 MED ORDER — CALCIUM CARBONATE-VITAMIN D 500-200 MG-UNIT PO TABS
2.0000 | ORAL_TABLET | Freq: Every day | ORAL | Status: DC
  Administered 2019-03-04 – 2019-03-05 (×2): 2 via ORAL
  Filled 2019-03-03 (×2): qty 2

## 2019-03-03 MED ORDER — SODIUM CHLORIDE 0.9 % IV SOLN
INTRAVENOUS | Status: DC | PRN
  Administered 2019-03-03: 50 ug/min via INTRAVENOUS

## 2019-03-03 MED ORDER — ACETAMINOPHEN 10 MG/ML IV SOLN
INTRAVENOUS | Status: DC | PRN
  Administered 2019-03-03: 1000 mg via INTRAVENOUS

## 2019-03-03 MED ORDER — DEXAMETHASONE SODIUM PHOSPHATE 10 MG/ML IJ SOLN
INTRAMUSCULAR | Status: AC
  Filled 2019-03-03: qty 1

## 2019-03-03 MED ORDER — FENTANYL CITRATE (PF) 100 MCG/2ML IJ SOLN
25.0000 ug | INTRAMUSCULAR | Status: DC | PRN
  Administered 2019-03-03 (×4): 25 ug via INTRAVENOUS

## 2019-03-03 MED ORDER — NEOMYCIN-POLYMYXIN B GU 40-200000 IR SOLN
Status: AC
  Filled 2019-03-03: qty 20

## 2019-03-03 MED ORDER — PROPOFOL 500 MG/50ML IV EMUL
INTRAVENOUS | Status: AC
  Filled 2019-03-03: qty 100

## 2019-03-03 MED ORDER — DEXAMETHASONE SODIUM PHOSPHATE 10 MG/ML IJ SOLN
INTRAMUSCULAR | Status: DC | PRN
  Administered 2019-03-03: 5 mg via INTRAVENOUS

## 2019-03-03 MED ORDER — ONDANSETRON HCL 4 MG PO TABS: 4.0000 mg | ORAL_TABLET | Freq: Four times a day (QID) | ORAL | Status: DC | PRN

## 2019-03-03 MED ORDER — FAMOTIDINE 20 MG PO TABS
ORAL_TABLET | ORAL | Status: AC
  Administered 2019-03-03: 20 mg via ORAL
  Filled 2019-03-03: qty 1

## 2019-03-03 MED ORDER — DEXAMETHASONE SODIUM PHOSPHATE 10 MG/ML IJ SOLN
8.0000 mg | Freq: Once | INTRAMUSCULAR | Status: AC
  Administered 2019-03-03: 07:00:00 8 mg via INTRAVENOUS

## 2019-03-03 MED ORDER — CEFAZOLIN SODIUM-DEXTROSE 2-4 GM/100ML-% IV SOLN
2.0000 g | INTRAVENOUS | Status: AC
  Administered 2019-03-03: 2 g via INTRAVENOUS

## 2019-03-03 MED ORDER — PHENOL 1.4 % MT LIQD: 1.0000 | OROMUCOSAL | Status: DC | PRN

## 2019-03-03 MED ORDER — TRANEXAMIC ACID-NACL 1000-0.7 MG/100ML-% IV SOLN
1000.0000 mg | Freq: Once | INTRAVENOUS | Status: AC
  Administered 2019-03-03: 1000 mg via INTRAVENOUS
  Filled 2019-03-03: qty 100

## 2019-03-03 MED ORDER — SUGAMMADEX SODIUM 200 MG/2ML IV SOLN
INTRAVENOUS | Status: DC | PRN
  Administered 2019-03-03: 150 mg via INTRAVENOUS

## 2019-03-03 MED ORDER — ACETAMINOPHEN 325 MG PO TABS: 325.0000 mg | ORAL_TABLET | Freq: Four times a day (QID) | ORAL | Status: DC | PRN

## 2019-03-03 MED ORDER — MENTHOL 3 MG MT LOZG: 1.0000 | LOZENGE | OROMUCOSAL | Status: DC | PRN

## 2019-03-03 MED ORDER — DIPHENHYDRAMINE HCL 12.5 MG/5ML PO ELIX: 12.5000 mg | ORAL_SOLUTION | ORAL | Status: DC | PRN

## 2019-03-03 MED ORDER — HYDROMORPHONE HCL 1 MG/ML IJ SOLN: 0.5000 mg | INTRAMUSCULAR | Status: DC | PRN

## 2019-03-03 MED ORDER — GLYCOPYRROLATE 0.2 MG/ML IJ SOLN
INTRAMUSCULAR | Status: AC
  Filled 2019-03-03: qty 2

## 2019-03-03 MED ORDER — FENTANYL CITRATE (PF) 100 MCG/2ML IJ SOLN
INTRAMUSCULAR | Status: AC
  Filled 2019-03-03: qty 2

## 2019-03-03 MED ORDER — HYDROCHLOROTHIAZIDE 25 MG PO TABS
25.0000 mg | ORAL_TABLET | Freq: Every day | ORAL | Status: DC
  Administered 2019-03-03 – 2019-03-05 (×3): 25 mg via ORAL
  Filled 2019-03-03 (×3): qty 1

## 2019-03-03 MED ORDER — MIDAZOLAM HCL 2 MG/2ML IJ SOLN
INTRAMUSCULAR | Status: DC | PRN
  Administered 2019-03-03: 1 mg via INTRAVENOUS

## 2019-03-03 MED ORDER — GLYCOPYRROLATE 0.2 MG/ML IJ SOLN
INTRAMUSCULAR | Status: DC | PRN
  Administered 2019-03-03: .4 mg via INTRAVENOUS
  Administered 2019-03-03 (×2): 0.2 mg via INTRAVENOUS

## 2019-03-03 MED ORDER — PANTOPRAZOLE SODIUM 40 MG PO TBEC
40.0000 mg | DELAYED_RELEASE_TABLET | Freq: Two times a day (BID) | ORAL | Status: DC
  Administered 2019-03-03 – 2019-03-05 (×5): 40 mg via ORAL
  Filled 2019-03-03 (×5): qty 1

## 2019-03-03 MED ORDER — LEVOTHYROXINE SODIUM 100 MCG PO TABS
100.0000 ug | ORAL_TABLET | Freq: Every day | ORAL | Status: DC
  Administered 2019-03-04 – 2019-03-05 (×2): 100 ug via ORAL
  Filled 2019-03-03 (×2): qty 1

## 2019-03-03 MED ORDER — CHLORHEXIDINE GLUCONATE 4 % EX LIQD: 60.0000 mL | Freq: Once | CUTANEOUS | Status: DC

## 2019-03-03 MED ORDER — BISACODYL 10 MG RE SUPP: 10.0000 mg | Freq: Every day | RECTAL | Status: DC | PRN

## 2019-03-03 MED ORDER — FLEET ENEMA 7-19 GM/118ML RE ENEM: 1.0000 | ENEMA | Freq: Once | RECTAL | Status: DC | PRN

## 2019-03-03 MED ORDER — TRAMADOL HCL 50 MG PO TABS
50.0000 mg | ORAL_TABLET | ORAL | Status: DC | PRN
  Administered 2019-03-03 (×2): 100 mg via ORAL
  Administered 2019-03-04 – 2019-03-05 (×2): 50 mg via ORAL
  Filled 2019-03-03: qty 2
  Filled 2019-03-03: qty 1
  Filled 2019-03-03: qty 2
  Filled 2019-03-03: qty 1

## 2019-03-03 MED ORDER — LIDOCAINE HCL (PF) 2 % IJ SOLN
INTRAMUSCULAR | Status: AC
  Filled 2019-03-03: qty 10

## 2019-03-03 MED ORDER — AMLODIPINE BESYLATE 10 MG PO TABS
10.0000 mg | ORAL_TABLET | Freq: Every day | ORAL | Status: DC
  Administered 2019-03-04 – 2019-03-05 (×2): 10 mg via ORAL
  Filled 2019-03-03 (×2): qty 1

## 2019-03-03 MED ORDER — ACETAMINOPHEN 10 MG/ML IV SOLN
INTRAVENOUS | Status: AC
  Filled 2019-03-03: qty 100

## 2019-03-03 MED ORDER — ROCURONIUM BROMIDE 50 MG/5ML IV SOLN
INTRAVENOUS | Status: AC
  Filled 2019-03-03: qty 2

## 2019-03-03 MED ORDER — ALUM & MAG HYDROXIDE-SIMETH 200-200-20 MG/5ML PO SUSP: 30.0000 mL | ORAL | Status: DC | PRN

## 2019-03-03 MED ORDER — ATROPINE SULFATE 0.4 MG/ML IJ SOLN
INTRAMUSCULAR | Status: AC
  Filled 2019-03-03: qty 1

## 2019-03-03 MED ORDER — CELECOXIB 200 MG PO CAPS
ORAL_CAPSULE | ORAL | Status: AC
  Administered 2019-03-03: 400 mg via ORAL
  Filled 2019-03-03: qty 2

## 2019-03-03 MED ORDER — NEOMYCIN-POLYMYXIN B GU 40-200000 IR SOLN
Status: DC | PRN
  Administered 2019-03-03: 16 mL

## 2019-03-03 SURGICAL SUPPLY — 61 items
BLADE DRUM FLTD (BLADE) ×2 IMPLANT
BLADE SAW 90X25X1.19 OSCILLAT (BLADE) ×2 IMPLANT
CANISTER SUCT 1200ML W/VALVE (MISCELLANEOUS) ×2 IMPLANT
CANISTER SUCT 3000ML PPV (MISCELLANEOUS) ×4 IMPLANT
CARTRIDGE OIL MAESTRO DRILL (MISCELLANEOUS) ×1 IMPLANT
COVER WAND RF STERILE (DRAPES) ×2 IMPLANT
CUP ACETBLR 52 OD 100 SERIES (Hips) ×2 IMPLANT
DIFFUSER DRILL AIR PNEUMATIC (MISCELLANEOUS) ×2 IMPLANT
DRAPE 3/4 80X56 (DRAPES) ×2 IMPLANT
DRAPE INCISE IOBAN 66X60 STRL (DRAPES) ×2 IMPLANT
DRSG DERMACEA 8X12 NADH (GAUZE/BANDAGES/DRESSINGS) ×2 IMPLANT
DRSG OPSITE POSTOP 4X12 (GAUZE/BANDAGES/DRESSINGS) ×2 IMPLANT
DRSG OPSITE POSTOP 4X14 (GAUZE/BANDAGES/DRESSINGS) IMPLANT
DRSG TEGADERM 4X4.75 (GAUZE/BANDAGES/DRESSINGS) ×2 IMPLANT
DURAPREP 26ML APPLICATOR (WOUND CARE) ×2 IMPLANT
ELECT REM PT RETURN 9FT ADLT (ELECTROSURGICAL) ×2
ELECTRODE REM PT RTRN 9FT ADLT (ELECTROSURGICAL) ×1 IMPLANT
GLOVE BIO SURGEON STRL SZ7.5 (GLOVE) ×4 IMPLANT
GLOVE BIOGEL M STRL SZ7.5 (GLOVE) ×4 IMPLANT
GLOVE BIOGEL PI IND STRL 7.5 (GLOVE) ×1 IMPLANT
GLOVE BIOGEL PI INDICATOR 7.5 (GLOVE) ×1
GLOVE INDICATOR 8.0 STRL GRN (GLOVE) ×2 IMPLANT
GOWN STRL REUS W/ TWL LRG LVL3 (GOWN DISPOSABLE) ×2 IMPLANT
GOWN STRL REUS W/ TWL XL LVL3 (GOWN DISPOSABLE) ×1 IMPLANT
GOWN STRL REUS W/TWL LRG LVL3 (GOWN DISPOSABLE) ×2
GOWN STRL REUS W/TWL XL LVL3 (GOWN DISPOSABLE) ×1
HEAD M SROM 36MM PLUS 1.5 (Hips) ×1 IMPLANT
HEMOVAC 400CC 10FR (MISCELLANEOUS) ×2 IMPLANT
HOLDER FOLEY CATH W/STRAP (MISCELLANEOUS) ×2 IMPLANT
HOOD PEEL AWAY FLYTE STAYCOOL (MISCELLANEOUS) ×6 IMPLANT
KIT TURNOVER KIT A (KITS) ×2 IMPLANT
LINER MARATHON 4MM 10DEG 36X52 (Hips) ×2 IMPLANT
MANIFOLD NEPTUNE II (INSTRUMENTS) ×2 IMPLANT
NDL SAFETY ECLIPSE 18X1.5 (NEEDLE) ×1 IMPLANT
NEEDLE HYPO 18GX1.5 SHARP (NEEDLE) ×1
NS IRRIG 500ML POUR BTL (IV SOLUTION) ×2 IMPLANT
OIL CARTRIDGE MAESTRO DRILL (MISCELLANEOUS) ×2
PACK HIP PROSTHESIS (MISCELLANEOUS) ×2 IMPLANT
PAD ONESTEP ZOLL R SERIES ADT (MISCELLANEOUS) ×2 IMPLANT
PENCIL SMOKE ULTRAEVAC 22 CON (MISCELLANEOUS) ×2 IMPLANT
PIN STEIN THRED 5/32 (Pin) ×2 IMPLANT
PULSAVAC PLUS IRRIG FAN TIP (DISPOSABLE) ×2
SOL .9 NS 3000ML IRR  AL (IV SOLUTION) ×1
SOL .9 NS 3000ML IRR UROMATIC (IV SOLUTION) ×1 IMPLANT
SOL PREP PVP 2OZ (MISCELLANEOUS) ×2
SOLUTION PREP PVP 2OZ (MISCELLANEOUS) ×1 IMPLANT
SPONGE DRAIN TRACH 4X4 STRL 2S (GAUZE/BANDAGES/DRESSINGS) ×2 IMPLANT
SROM M HEAD 36MM PLUS 1.5 (Hips) ×2 IMPLANT
STAPLER SKIN PROX 35W (STAPLE) ×2 IMPLANT
STEM AML 12.0 STD 6 LRG (Hips) ×2 IMPLANT
SUT ETHIBOND #5 BRAIDED 30INL (SUTURE) ×2 IMPLANT
SUT VIC AB 0 CT1 36 (SUTURE) ×2 IMPLANT
SUT VIC AB 1 CT1 36 (SUTURE) ×4 IMPLANT
SUT VIC AB 2-0 CT1 27 (SUTURE) ×1
SUT VIC AB 2-0 CT1 TAPERPNT 27 (SUTURE) ×1 IMPLANT
SYR 20ML LL LF (SYRINGE) ×2 IMPLANT
TAPE CLOTH 3X10 WHT NS LF (GAUZE/BANDAGES/DRESSINGS) ×2 IMPLANT
TAPE TRANSPORE STRL 2 31045 (GAUZE/BANDAGES/DRESSINGS) ×2 IMPLANT
TIP FAN IRRIG PULSAVAC PLUS (DISPOSABLE) ×1 IMPLANT
TOWEL OR 17X26 4PK STRL BLUE (TOWEL DISPOSABLE) ×2 IMPLANT
TRAY FOLEY MTR SLVR 16FR STAT (SET/KITS/TRAYS/PACK) ×2 IMPLANT

## 2019-03-03 NOTE — TOC Progression Note (Signed)
Transition of Care [facility]) - Progression Note    Patient Details  Name: Heather Potter MRN: [id number] Date of Birth: 12/16/1945  [facility]) CM/SW [place], RN Phone Number: 03/03/2019, 11:59 AM  Clinical Narrative:     Requested the price of Lovenox       Expected Discharge Plan and Services                                                 Social Determinants of Health (SDOH) Interventions    Readmission Risk Interventions No flowsheet data found.

## 2019-03-03 NOTE — Anesthesia Preprocedure Evaluation (Signed)
Anesthesia Evaluation  Patient identified by MRN, date of birth, ID band Patient awake    Reviewed: Allergy & Precautions, H&P , NPO status , Patient's Chart, lab work & pertinent test results, reviewed documented beta blocker date and time   Airway Mallampati: II  TM Distance: >3 FB Neck ROM: full    Dental  (+) Teeth Intact   Pulmonary neg pulmonary ROS, former smoker,    Pulmonary exam normal        Cardiovascular Exercise Tolerance: Poor hypertension, On Medications + Past MI  Normal cardiovascular exam Rhythm:regular Rate:Normal     Neuro/Psych negative neurological ROS  negative psych ROS   GI/Hepatic negative GI ROS, Neg liver ROS,   Endo/Other  negative endocrine ROSHypothyroidism   Renal/GU negative Renal ROS  negative genitourinary   Musculoskeletal   Abdominal   Peds  Hematology negative hematology ROS (+)   Anesthesia Other Findings Past Medical History: No date: Colitis No date: History of blood transfusion     Comment:  D and C and miscarriage No date: History of gynecologic surgery     Comment:  had right ovary removed, unsure about uterus/cervix No date: Hyperlipidemia No date: Hypertension No date: Hypothyroidism [id number]: Lichen planus     Comment:  Treated by Dr. Kellie Moor (derm) with methotrexate No date: Osteoarthritis No date: Osteopenia Past Surgical History: No date: APPENDECTOMY 1998: COLON SURGERY     Comment:  colectomy for ? 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 No date: OOPHORECTOMY     Comment:  right No date: OTHER SURGICAL  HISTORY     Comment:  part of intestine removed No date: TUBAL LIGATION     Comment:  bilat   Reproductive/Obstetrics negative OB ROS                             Anesthesia Physical Anesthesia Plan  ASA: III  Anesthesia Plan: General ETT   Post-op Pain Management:    Induction:   PONV Risk Score and Plan:   Airway Management Planned:   Additional Equipment:   Intra-op Plan:   Post-operative Plan:   Informed Consent: I have reviewed the patients History and Physical, chart, labs and discussed the procedure including the risks, benefits and alternatives for the proposed anesthesia with the patient or authorized representative who has indicated his/her understanding and acceptance.     Dental Advisory Given  Plan Discussed with: CRNA  Anesthesia Plan Comments:         Anesthesia Quick Evaluation

## 2019-03-03 NOTE — Progress Notes (Signed)
Spoke with Dr. Andree Elk who states pt needs Cardiology Consult within 6 hours. MD states Dr. Clayborn Bigness is aware of concerns. Pt will need telemetry monitoring on ortho floor.  Orders placed.

## 2019-03-03 NOTE — Op Note (Signed)
OPERATIVE NOTE  DATE OF SURGERY:  03/03/2019  PATIENT NAME:  Heather Potter   DOB: March 15, 1946  MRN: [id number]  PRE-OPERATIVE DIAGNOSIS: Degenerative arthrosis of the right hip, primary  POST-OPERATIVE DIAGNOSIS:  Same  PROCEDURE:  Right total hip arthroplasty  SURGEON:  Marciano Sequin. M.D.  ASSISTANT: Cassell Smiles, PA-C (present and scrubbed throughout the case, critical for assistance with exposure, retraction, instrumentation, and closure)  ANESTHESIA: general  ESTIMATED BLOOD LOSS: 250 mL  FLUIDS REPLACED: 900 mL of crystalloid  DRAINS: 2 medium drains to a Hemovac reservoir  IMPLANTS UTILIZED: DePuy 12 mm large stature AML femoral stem, 52 mm OD Pinnacle 100 acetabular component, +4 mm 10 degree Pinnacle Marathon polyethylene insert, and a 36 mm M-SPEC +1.5 mm hip ball  INDICATIONS FOR SURGERY: AANIKA Potter is a 73 y.o. year old female with a long history of progressive hip and groin  pain. X-rays demonstrated severe degenerative changes. The patient had not seen any significant improvement despite conservative nonsurgical intervention. After discussion of the risks and benefits of surgical intervention, the patient expressed understanding of the risks benefits and agree with plans for total hip arthroplasty.   The risks, benefits, and alternatives were discussed at length including but not limited to the risks of infection, bleeding, nerve injury, stiffness, blood clots, the need for revision surgery, limb length inequality, dislocation, cardiopulmonary complications, among others, and they were willing to proceed.  PROCEDURE IN DETAIL: The patient was brought into the operating room and, after adequate general anesthesia was achieved, the patient was placed in a left lateral decubitus position. Axillary roll was placed and all bony prominences were well-padded. The patient's right hip was cleaned and prepped with alcohol and DuraPrep and draped in the usual sterile  fashion. A "timeout" was performed as per usual protocol. A lateral curvilinear incision was made gently curving towards the posterior superior iliac spine. The IT band was incised in line with the skin incision and the fibers of the gluteus maximus were split in line. The piriformis tendon was identified, skeletonized, and incised at its insertion to the proximal femur and reflected posteriorly. A T type posterior capsulotomy was performed. Prior to dislocation of the femoral head, a threaded Steinmann pin was inserted through a separate stab incision into the pelvis superior to the acetabulum and bent in the form of a stylus so as to assess limb length and hip offset throughout the procedure. The femoral head was then dislocated posteriorly. Inspection of the femoral head demonstrated severe degenerative changes with full-thickness loss of articular cartilage. The femoral neck cut was performed using an oscillating saw. The anterior capsule was elevated off of the femoral neck using a periosteal elevator. Attention was then directed to the acetabulum. The remnant of the labrum was excised using electrocautery. Inspection of the acetabulum also demonstrated significant degenerative changes. The acetabulum was reamed in sequential fashion up to a 51 mm diameter. Good punctate bleeding bone was encountered. A 52 mm Pinnacle 100 acetabular component was positioned and impacted into place. Good scratch fit was appreciated. A +4 mm neutral polyethylene trial was inserted.  Attention was then directed to the proximal femur. A hole for reaming of the proximal femoral canal was created using a high-speed burr. The femoral canal was reamed in sequential fashion up to a 11.5 mm diameter. This allowed for approximately 7 cm of scratch fit.  It was thus elected to ream up to a 12 mm diameter to allow for a line to  line fit.  Serial broaches were inserted up to a 12 mm large stature femoral broach. Calcar region was planed  and a trial reduction was performed using a 36 mm hip ball with a +1.5 mm neck length.  Reasonably good stability was noted, but it was elected to trial a +4 mm 10 degree trial with the high side at the 10 o'clock position.  Good equalization of limb lengths and hip offset was appreciated and excellent stability was noted both anteriorly and posteriorly. Trial components were removed. The acetabular shell was irrigated with copious amounts of normal saline with antibiotic solution and suctioned dry. A +4 mm 10 degree Pinnacle Marathon polyethylene insert was positioned with the high side oriented at the 10 o'clock position and impacted into place. Next, a 12 mm large stature AML femoral stem was positioned and impacted into place. Excellent scratch fit was appreciated. A trial reduction was again performed with a 36 mm hip ball with a +1.5 mm neck length. Again, good equalization of limb lengths was appreciated and excellent stability appreciated both anteriorly and posteriorly. The hip was then dislocated and the trial hip ball was removed. The Morse taper was cleaned and dried. A 36 mm M-SPEC hip ball with a +1.5 mm neck length was placed on the trunnion and impacted into place. The hip was then reduced and placed through range of motion. Excellent stability was appreciated both anteriorly and posteriorly.  The wound was irrigated with copious amounts of normal saline with antibiotic solution and suctioned dry. Good hemostasis was appreciated. The posterior capsulotomy was repaired using #5 Ethibond. Piriformis tendon was reapproximated to the undersurface of the gluteus medius tendon using #5 Ethibond. Two medium drains were placed in the wound bed and brought out through separate stab incisions to be attached to a Hemovac reservoir. The IT band was reapproximated using interrupted sutures of #1 Vicryl. Subcutaneous tissue was approximated using first #0 Vicryl followed by #2-0 Vicryl. The skin was closed with  skin staples.  The patient tolerated the procedure well and was transported to the recovery room in stable condition.   Marciano Sequin., M.D.

## 2019-03-03 NOTE — Care Management (Signed)
Per [facility] customer care Lovenox not covered( PA required for Brand name). Enoxaparin (generic) co-pay for a 30 day supply $90.00. No PA required Teir 4 medication No deductible  Retail pharmacies are: [facility]  Mail Order pharmacy [facility]. $180.00 for a 90 day supply. Enoxaparin.

## 2019-03-03 NOTE — Anesthesia Postprocedure Evaluation (Signed)
Anesthesia Post Note  Patient: Heather Potter  Procedure(s) Performed: TOTAL HIP ARTHROPLASTY (Right Hip)  Patient location during evaluation: PACU Anesthesia Type: General Level of consciousness: awake and alert Pain management: pain level controlled Vital Signs Assessment: post-procedure vital signs reviewed and stable Respiratory status: spontaneous breathing, nonlabored ventilation, respiratory function stable and patient connected to nasal cannula oxygen Cardiovascular status: blood pressure returned to baseline and stable Postop Assessment: no apparent nausea or vomiting Anesthetic complications: no     Last Vitals:  Vitals:   03/03/19 1253 03/03/19 1333  BP: 127/62 123/72  Pulse: 63 74  Resp: 18 12  Temp: 36.4 C 36.4 C  SpO2: 99% 100%    Last Pain:  Vitals:   03/03/19 1333  TempSrc: Oral  PainSc:                  [name]

## 2019-03-03 NOTE — Progress Notes (Signed)
Patient educated on and demonstrated use of instinctive spirometer.

## 2019-03-03 NOTE — Anesthesia Procedure Notes (Signed)
Procedure Name: Intubation Date/Time: 03/03/2019 7:26 AM Performed by: Johnna Acosta, CRNA Pre-anesthesia Checklist: Patient identified, Emergency Drugs available, Suction available, Patient being monitored and Timeout performed Patient Re-evaluated:Patient Re-evaluated prior to induction Oxygen Delivery Method: Circle system utilized Preoxygenation: Pre-oxygenation with 100% oxygen Induction Type: IV induction Ventilation: Mask ventilation without difficulty Laryngoscope Size: McGraph and 2 Grade View: Grade I Tube type: Oral Tube size: 7.0 mm Number of attempts: 1 Airway Equipment and Method: Stylet and Video-laryngoscopy Placement Confirmation: ETT inserted through vocal cords under direct vision,  positive ETCO2 and breath sounds checked- equal and bilateral Secured at: 20 cm Tube secured with: Tape Dental Injury: Teeth and Oropharynx as per pre-operative assessment  Difficulty Due To: Difficulty was anticipated, Difficult Airway-  due to neck instability and Difficult Airway- due to limited oral opening Future Recommendations: Recommend- induction with short-acting agent, and alternative techniques readily available Comments: Pt reported neck comfort prior to intubation.

## 2019-03-03 NOTE — Transfer of Care (Signed)
Immediate Anesthesia Transfer of Care Note  Patient: Heather Potter  Procedure(s) Performed: TOTAL HIP ARTHROPLASTY (Right Hip)  Patient Location: PACU  Anesthesia Type:General  Level of Consciousness: drowsy  Airway & Oxygen Therapy: Patient Spontanous Breathing and Patient connected to face mask oxygen  Post-op Assessment: Report given to RN and Post -op Vital signs reviewed and stable  Post vital signs: Reviewed and stable  Last Vitals:  Vitals Value Taken Time  BP 111/57 03/03/19 1116  Temp 36.4 C 03/03/19 1116  Pulse 67 03/03/19 1119  Resp 15 03/03/19 1119  SpO2 100 % 03/03/19 1119  Vitals shown include unvalidated device data.  Last Pain:  Vitals:   03/03/19 0637  TempSrc: Tympanic  PainSc: 0-No pain         Complications: No apparent anesthesia complications

## 2019-03-03 NOTE — Evaluation (Signed)
Physical Therapy Evaluation Patient Details Name: Heather Potter MRN: [id number] DOB: 1945-06-05 Today's Date: 03/03/2019   History of Present Illness  Pt is 73 yo female s/p THA, posterior approach, WBAT. PMH of smoking, HTN, MI, hypothyroidism, HLD, HTN, one kidney. Of note, pt pending cardiology consult after surgery, PT spoke with Dr. Clayborn Bigness, cleared for PT evaluation prior to session.     Clinical Impression  Patient in bed, A&Ox4, denied R hip pain at rest, but exhibited pain signs/symptoms with RLE movement/mobility. Pt reported she lives in a two story home (able to stay on the first floor) with her husband and mother in law. Previously independent at baseline for ADLs/IADLs, (she reported that she does normally use a SPC first thing in the AM, but her pain improves throughout the day).  The patient demonstrated therapeutic exercises with verbal/tactile cues, very little physical assist needed for initial SLR on RLE, but able to complete several reps without assistance. Supine to sit with minA and step by step cueing. Sit <> Stand with CGA and RW, pt initially demonstrated NWB on RLE, but with cues and ambulation progressed to WBAT on RLE. Pt up in recliner at end of session, all needs in reach.  Overall the patient demonstrated deficits (see "PT Problem List") that impede the patient's functional abilities, safety, and mobility and would benefit from skilled PT intervention. Recommendation is HHPT.      Follow Up Recommendations Home health PT    Equipment Recommendations  Rolling walker with 5" wheels;3in1 (PT)    Recommendations for Other Services OT consult     Precautions / Restrictions Precautions Precautions: Posterior Hip Precaution Booklet Issued: Yes (comment) Restrictions Weight Bearing Restrictions: Yes RLE Weight Bearing: Weight bearing as tolerated      Mobility  Bed Mobility Overal bed mobility: Needs Assistance Bed Mobility: Supine to Sit      Supine to sit: Min assist;HOB elevated     General bed mobility comments: cued for sequencing to maximize independence/adhere to precautions  Transfers Overall transfer level: Needs assistance Equipment used: Rolling walker (2 wheeled) Transfers: Sit to/from Stand Sit to Stand: Min guard         General transfer comment: pt performed with NWB on RLE  Ambulation/Gait Ambulation/Gait assistance: Min guard Gait Distance (Feet): 3 Feet Assistive device: Rolling walker (2 wheeled)       General Gait Details: step to gait pattern utilized, pt with improved weight bearing on RLE  Stairs            Wheelchair Mobility    Modified Rankin (Stroke Patients Only)       Balance Overall balance assessment: Needs assistance Sitting-balance support: Feet supported Sitting balance-Leahy Scale: Good       Standing balance-Leahy Scale: Fair                               Pertinent Vitals/Pain Pain Assessment: Faces Faces Pain Scale: Hurts a little bit Pain Location: R hip with mobility Pain Descriptors / Indicators: Guarding;Grimacing;Moaning Pain Intervention(s): Limited activity within patient's tolerance;Monitored during session;Repositioned    Home Living Family/patient expects to be discharged to:: Private residence Living Arrangements: Spouse/significant other;Other relatives(mother in law) Available Help at Discharge: Family Type of Home: House Home Access: Stairs to enter Entrance Stairs-Rails: Right;Left;Can reach both Entrance Stairs-Number of Steps: 5 Home Layout: Two level;1/2 bath on main level;Able to live on main level with bedroom/bathroom Home Equipment: None Additional Comments:  Pt has mother in laws equipment at home, none of her own    Prior Function Level of Independence: Independent               Hand Dominance   Dominant Hand: Right    Extremity/Trunk Assessment   Upper Extremity Assessment Upper Extremity  Assessment: Overall WFL for tasks assessed    Lower Extremity Assessment Lower Extremity Assessment: RLE deficits/detail;LLE deficits/detail RLE Deficits / Details: s/p THA LLE Deficits / Details: WFLs       Communication   Communication: No difficulties  Cognition Arousal/Alertness: Awake/alert Behavior During Therapy: WFL for tasks assessed/performed Overall Cognitive Status: Within Functional Limits for tasks assessed                                        General Comments      Exercises Total Joint Exercises Ankle Circles/Pumps: AROM;Strengthening;Both;10 reps Quad Sets: AROM;Strengthening;Both;10 reps Gluteal Sets: AROM;Strengthening;10 reps;Both Heel Slides: AROM;Strengthening;Both;10 reps Hip ABduction/ADduction: AROM;Strengthening;Right;10 reps Straight Leg Raises: AROM;Strengthening;Right;5 reps   Assessment/Plan    PT Assessment Patient needs continued PT services  PT Problem List Decreased strength;Decreased mobility;Decreased range of motion;Decreased safety awareness;Decreased knowledge of precautions;Decreased activity tolerance;Decreased balance;Decreased knowledge of use of DME;Pain       PT Treatment Interventions DME instruction;Therapeutic exercise;Gait training;Balance training;Neuromuscular re-education;Stair training;Functional mobility training;Therapeutic activities;Patient/family education    PT Goals (Current goals can be found in the Care Plan section)  Acute Rehab PT Goals Patient Stated Goal: to go home PT Goal Formulation: With patient Time For Goal Achievement: 03/17/19 Potential to Achieve Goals: Good    Frequency BID   Barriers to discharge        Co-evaluation               AM-PAC PT "6 Clicks" Mobility  Outcome Measure Help needed turning from your back to your side while in a flat bed without using bedrails?: A Little Help needed moving from lying on your back to sitting on the side of a flat bed  without using bedrails?: A Little Help needed moving to and from a bed to a chair (including a wheelchair)?: A Little Help needed standing up from a chair using your arms (e.g., wheelchair or bedside chair)?: A Little Help needed to walk in hospital room?: A Little Help needed climbing 3-5 steps with a railing? : A Little 6 Click Score: 18    End of Session Equipment Utilized During Treatment: Gait belt Activity Tolerance: Patient tolerated treatment well Patient left: in chair;with chair alarm set;with call bell/phone within reach;with SCD's reapplied Nurse Communication: Mobility status PT Visit Diagnosis: Muscle weakness (generalized) (M62.81);Pain;Other abnormalities of gait and mobility (R26.89) Pain - Right/Left: Right Pain - part of body: Hip    Time: [id number] PT Time Calculation (min) (ACUTE ONLY): 33 min   Charges:   PT Evaluation $PT Eval Low Complexity: 1 Low PT Treatments $Therapeutic Exercise: 8-22 mins        Lieutenant Diego PT, DPT 3:59 PM,03/03/19 [phone]

## 2019-03-03 NOTE — Anesthesia Post-op Follow-up Note (Signed)
Anesthesia QCDR form completed.        

## 2019-03-03 NOTE — H&P (Signed)
The patient has been re-examined, and the chart reviewed, and there have been no interval changes to the documented history and physical.    The risks, benefits, and alternatives have been discussed at length. The patient expressed understanding of the risks benefits and agreed with plans for surgical intervention.  Juvencio Verdi P. Talayah Picardi, Jr. M.D.    

## 2019-03-04 LAB — SURGICAL PATHOLOGY

## 2019-03-04 NOTE — Progress Notes (Signed)
  Subjective: 1 Day Post-Op Procedure(s) (LRB): TOTAL HIP ARTHROPLASTY (Right) Patient reports pain as  Well-controlled.   Patient is well, and has had no acute complaints or problems.  Plan is to go Home after hospital stay. Negative for chest pain and shortness of breath Fever: no Gastrointestinal: negative for nausea and vomiting.  Patient has not had a bowel movement.  Objective: Vital signs in last 24 hours: Temp:  [97 F (36.1 C)-98.9 F (37.2 C)] 98.9 F (37.2 C) (10/15 0457) Pulse Rate:  [62-76] 64 (10/15 0457) Resp:  [12-21] 16 (10/15 0457) BP: (101-127)/(55-72) 109/55 (10/15 0457) SpO2:  [91 %-100 %] 94 % (10/15 0457)  Intake/Output from previous day:  Intake/Output Summary (Last 24 hours) at 03/04/2019 0746 Last data filed at 03/04/2019 0457 Gross per 24 hour  Intake 1249.89 ml  Output 2390 ml  Net -1140.11 ml    Intake/Output this shift: No intake/output data recorded.  Labs: No results for input(s): HGB in the last 72 hours. No results for input(s): WBC, RBC, HCT, PLT in the last 72 hours. No results for input(s): NA, K, CL, CO2, BUN, CREATININE, GLUCOSE, CALCIUM in the last 72 hours. No results for input(s): LABPT, INR in the last 72 hours.   EXAM General - Patient is Alert, Appropriate and Oriented Extremity - Neurovascular intact Dorsiflexion/Plantar flexion intact Incision: scant drainage Compartment soft Dressing/Incision - dry, minimal bleeding Motor Function - intact, moving foot and toes well on exam.  Cardiovascular- Regular rate and rhythm, no murmurs/rubs/gallops Respiratory- Lungs clear to auscultation bilaterally Gastrointestinal- active bowel sounds   Assessment/Plan: 1 Day Post-Op Procedure(s) (LRB): TOTAL HIP ARTHROPLASTY (Right) Active Problems:   H/O total hip arthroplasty  Estimated body mass index is 27.07 kg/m as calculated from the following:   Height as of 02/23/19: 5' 2.5" (1.588 m).   Weight as of 02/23/19: 68.2  kg. Advance diet Up with therapy Plan for discharge tomorrow  Patient to be evaluated by cardiology. Order placed.   DVT Prophylaxis - Lovenox, TED hose, foot pumps  Weight-Bearing as tolerated to right leg  Cassell Smiles, PA-C [facility] Orthopaedic Surgery 03/04/2019, 7:46 AM

## 2019-03-04 NOTE — TOC Initial Note (Signed)
Transition of Care [facility]) - Initial/Assessment Note    Patient Details  Name: Heather Potter MRN: [id number] Date of Birth: 01-16-1946  [facility] CM/SW Contact:    Su Hilt, RN Phone Number: 03/04/2019, 11:50 AM  Clinical Narrative:                  Met with the patient to discuss DC plan and needs She lives at home with her husband and mother in law She needs a RW and a BSC, I notified Jeneen Rinks with [facility] She is wanting to use [facility], I notified Helene Kelp with [facility] I notified her of the price of Lovenox at $90 for 30 days, she is familiar with how to do it as she has given them to her mother in law for months Her husband will provide transportation She is up to date with her PCP and can afford her medications  Expected Discharge Plan: [place] Barriers to Discharge: Continued Medical Work up   Patient Goals and CMS Choice Patient states their goals for this hospitalization and ongoing recovery are:: go home CMS Medicare.gov Compare Post Acute Care list provided to:: Patient Choice offered to / list presented to : Patient  Expected Discharge Plan and Services Expected Discharge Plan: [place]   Discharge Planning Services: CM Consult Post Acute Care Choice: [place] arrangements for the past 2 months: Single Family Home                 DME Arranged: 3-N-1, Walker rolling DME Agency: [facility] Date DME Agency Contacted: 03/04/19 Time DME Agency Contacted: 16 Representative spoke with at DME Agency: Jeneen Rinks HH Arranged: PT [place]: [facility] ([facility] [employer]) Date [place]: 03/04/19 Time [place]: 72 Representative spoke with at [place]: Burna Arrangements/Services Living arrangements for the past 2 months: [place] Lives with:: Spouse, Relatives(spouse and Mother in Sports coach) Patient language and need for interpreter reviewed::  Yes Do you feel safe going back to the place where you live?: Yes      Need for Family Participation in Patient Care: No (Comment) Care giver support system in place?: Yes (comment) Current home services: DME(grabber, cane) Criminal Activity/Legal Involvement Pertinent to Current Situation/Hospitalization: No - Comment as needed  Activities of Daily Living Home Assistive Devices/Equipment: Cane (specify quad or straight) ADL Screening (condition at time of admission) Patient's cognitive ability adequate to safely complete daily activities?: Yes Is the patient deaf or have difficulty hearing?: Yes Does the patient have difficulty seeing, even when wearing glasses/contacts?: No Does the patient have difficulty concentrating, remembering, or making decisions?: No Patient able to express need for assistance with ADLs?: Yes Does the patient have difficulty dressing or bathing?: No Independently performs ADLs?: Yes (appropriate for developmental age) Does the patient have difficulty walking or climbing stairs?: Yes Weakness of Legs: None Weakness of Arms/Hands: None  Permission Sought/Granted   Permission granted to share information with : Yes, Verbal Permission Granted              Emotional Assessment Appearance:: Appears stated age Attitude/Demeanor/Rapport: Engaged Affect (typically observed): Appropriate Orientation: : Oriented to Self, Oriented to Place, Oriented to  Time, Oriented to Situation Alcohol / Substance Use: Not Applicable Psych Involvement: No (comment)  Admission diagnosis:  AVASCULAR NECROSIS OF RIGHT HIP. Patient Active Problem List   Diagnosis Date Noted  . H/O total hip arthroplasty 03/03/2019  .  Osteoarthritis of right hip 01/03/2019  . Avascular necrosis of bone of right hip ([place]) 11/11/2018  . Generalized osteoarthritis of hand 11/11/2018  . PMR (polymyalgia rheumatica) (HCC) 05/26/2018  . Lichen planus 56/43/3295  . Well woman exam 08/28/2016  .  Superficial varicosities 09/13/2015  . Osteopenia   . Hypothyroidism 10/27/2009  . HLD (hyperlipidemia) 10/27/2009  . Essential hypertension 10/27/2009  . Osteoarthritis 10/27/2009  . NEPHRECTOMY, HX OF 10/27/2009   PCP:  Elby Beck, FNP Pharmacy:   [facility] [address] [place] - [place] [address] [place] [address] Phone: [phone] Fax: [phone]  [facility] - [place], [place] [address] Phone: [phone] Fax: [phone]     Social Determinants of Health (SDOH) Interventions    Readmission Risk Interventions No flowsheet data found.

## 2019-03-04 NOTE — Evaluation (Signed)
Occupational Therapy Evaluation Patient Details Name: Heather Potter MRN: [id number] DOB: 1945-10-16 Today's Date: 03/04/2019    History of Present Illness Pt is 73 yo female s/p THA, posterior approach, WBAT. PMH of smoking, HTN, MI, hypothyroidism, HLD, HTN, one kidney. Of note, pt pending cardiology consult after surgery, PT spoke with Dr. Clayborn Bigness, cleared for PT evaluation prior to session.   Clinical Impression   Pt seen for OT evaluation this date, POD#1 from above surgery. Pt was independent in all ADL prior to surgery and occasionally using SPC for functional mobility in the mornings due to R hip pain. Pt also endorses several near falls resulting from R hip pain and dysfunction. Pt is eager to return to PLOF with less pain and improved safety and independence. Pt currently requires minimal assist for LB dressing while in seated position due to pain and limited AROM of R hip. Pt able to recall 2/3 posterior total hip precautions at start of session and unable to verbalize how to implement during ADL and mobility. Pt instructed in posterior total hip precautions and how to implement, self care skills, falls prevention strategies, home/routines modifications, pet care considerations, functional transfer training and RW mgt in kitchen vs bathroom, DME/AE for LB bathing and dressing tasks, compression stocking mgt strategies, and car transfer techniques. Pt verbalized understanding. Handout provided to support recall and carryover. At end of session, pt able to recall 3/3 posterior total hip precautions. Pt would benefit from additional instruction in self care skills and techniques to help maintain precautions with or without assistive devices to support recall and carryover prior to discharge. Recommend BSC upon discharge.      Follow Up Recommendations  No OT follow up    Equipment Recommendations  3 in 1 bedside commode    Recommendations for Other Services       Precautions /  Restrictions Precautions Precautions: Posterior Hip;Fall Precaution Booklet Issued: Yes (comment) Restrictions Weight Bearing Restrictions: Yes RLE Weight Bearing: Weight bearing as tolerated      Mobility Bed Mobility Overal bed mobility: Needs Assistance Bed Mobility: Supine to Sit;Sit to Supine     Supine to sit: Min guard;HOB elevated Sit to supine: Min guard   General bed mobility comments: cues for sequencing/precautions  Transfers                      Balance Overall balance assessment: Needs assistance Sitting-balance support: Feet supported Sitting balance-Leahy Scale: Good                                     ADL either performed or assessed with clinical judgement   ADL Overall ADL's : Needs assistance/impaired                                       General ADL Comments: Min A for LOB bathing and dressing, Max A for compression stocking mgt; CGA for functional ADL transfers; spouse able to provide needed level of assist per pt     Vision Baseline Vision/History: Wears glasses Wears Glasses: Reading only Patient Visual Report: No change from baseline       Perception     Praxis      Pertinent Vitals/Pain Pain Assessment: No/denies pain Pain Intervention(s): Premedicated before session     Hand Dominance Right  Extremity/Trunk Assessment Upper Extremity Assessment Upper Extremity Assessment: Overall WFL for tasks assessed   Lower Extremity Assessment Lower Extremity Assessment: RLE deficits/detail RLE Deficits / Details: s/p THA       Communication Communication Communication: No difficulties   Cognition Arousal/Alertness: Awake/alert Behavior During Therapy: WFL for tasks assessed/performed Overall Cognitive Status: Within Functional Limits for tasks assessed                                     General Comments       Exercises Other Exercises Other Exercises: Pt instructed in  posterior THPs and how to maintain during ADL tasks and functional transfers, functional transfer training, RW mgt in bathroom/kitchen, AE/DME, home/routines  modifications, pet care considerations, and falls prevention strategies; handout provided to support recall and carryover   Shoulder Instructions      Home Living Family/patient expects to be discharged to:: Private residence Living Arrangements: Spouse/significant other;Other relatives(mother in law) Available Help at Discharge: Family Type of Home: House Home Access: Stairs to enter [employer] of Steps: 5 Entrance Stairs-Rails: Right;Left;Can reach both Home Layout: Two level;1/2 bath on main level;Able to live on main level with bedroom/bathroom     Bathroom Shower/Tub: Teacher, early years/pre: Standard     Home Equipment: None;Adaptive equipment Adaptive Equipment: Reacher Additional Comments: Pt has mother in laws equipment at home, none of her own      Prior Functioning/Environment Level of Independence: Independent;Independent with assistive device(s)        Comments: Pt uses SPC occasionally in the am 2/2 R hip pain; indep with ADL; several near falls/slight LOB 2/2 R hip pain        OT Problem List: Decreased strength;Decreased range of motion;Pain;Decreased knowledge of use of DME or AE;Decreased knowledge of precautions;Impaired balance (sitting and/or standing)      OT Treatment/Interventions: Self-care/ADL training;Therapeutic exercise;Therapeutic activities;DME and/or AE instruction;Patient/family education;Balance training    OT Goals(Current goals can be found in the care plan section) Acute Rehab OT Goals Patient Stated Goal: to go home OT Goal Formulation: With patient Time For Goal Achievement: 03/18/19 Potential to Achieve Goals: Good ADL Goals Pt Will Perform Lower Body Dressing: with supervision;sit to/from stand;with adaptive equipment(maintaining posterior THPs) Pt  Will Transfer to Toilet: with supervision;ambulating(BSC over toilet, maintaining posterior THPs) Additional ADL Goal #1: Pt will independently instruct family/caregiver in compression stocking mgt Additional ADL Goal #2: Pt will independently verbalize 3/3 posterior THPs and how to maintain during ADL tasks.  OT Frequency: Min 1X/week   Barriers to D/C:            Co-evaluation              AM-PAC OT "6 Clicks" Daily Activity     Outcome Measure Help from another person eating meals?: None Help from another person taking care of personal grooming?: None Help from another person toileting, which includes using toliet, bedpan, or urinal?: A Little Help from another person bathing (including washing, rinsing, drying)?: A Little Help from another person to put on and taking off regular upper body clothing?: None Help from another person to put on and taking off regular lower body clothing?: A Little 6 Click Score: 21   End of Session    Activity Tolerance: Patient tolerated treatment well Patient left: in bed;with call bell/phone within reach;with bed alarm set;Other (comment)(TED off by nuse tech, pillows positioned to prevent  R IR of hip and prevent pt crossing legs)  OT Visit Diagnosis: Other abnormalities of gait and mobility (R26.89);Muscle weakness (generalized) (M62.81);Pain Pain - Right/Left: Right Pain - part of body: Hip                Time: [id number] OT Time Calculation (min): 30 min Charges:  OT General Charges $OT Visit: 1 Visit OT Evaluation $OT Eval Low Complexity: 1 Low OT Treatments $Self Care/Home Management : 8-22 mins  Jeni Salles, MPH, MS, OTR/L ascom [phone] 03/04/19, 9:21 AM

## 2019-03-04 NOTE — Progress Notes (Signed)
Physical Therapy Treatment Patient Details Name: Heather Potter MRN: [id number] DOB: 1945/07/20 Today's Date: 03/04/2019    History of Present Illness Pt is 73 yo female s/p THA, posterior approach, WBAT. PMH of smoking, HTN, MI, hypothyroidism, HLD, HTN, one kidney. Of note, pt pending cardiology consult after surgery, PT spoke with Dr. Clayborn Bigness, cleared for PT evaluation prior to session.    PT Comments    Completed lap around unit, stair training and bathroom to void with RW and min guard.  Returned to bed and she was able to manage LE's without assist.  Excellent progress with sessions today and anticipate discharge home tomorrow.   Follow Up Recommendations  Home health PT;Supervision for mobility/OOB     Equipment Recommendations  Rolling walker with 5" wheels;3in1 (PT)    Recommendations for Other Services       Precautions / Restrictions Precautions Precautions: Posterior Hip;Fall Precaution Booklet Issued: No Restrictions Weight Bearing Restrictions: Yes RLE Weight Bearing: Weight bearing as tolerated    Mobility  Bed Mobility Overal bed mobility: Needs Assistance Bed Mobility: Supine to Sit     Supine to sit: Min guard Sit to supine: Min guard   General bed mobility comments: cues for sequencing/precautions  Transfers Overall transfer level: Needs assistance Equipment used: Rolling walker (2 wheeled) Transfers: Sit to/from Stand Sit to Stand: Min guard         General transfer comment: education that she can use RLE for transfers.  Continues to hold LE in extension for sit to stand transfers.  voiced understanding.  Ambulation/Gait Ambulation/Gait assistance: Min guard Gait Distance (Feet): 200 Feet Assistive device: Rolling walker (2 wheeled) Gait Pattern/deviations: Step-through pattern Gait velocity: decreased   General Gait Details: generaly safe and steady.  some drifting noted but able to self correct.   Stairs Stairs: Yes Stairs  assistance: Min guard Stair Management: Two rails;Step to pattern Number of Stairs: 4 General stair comments: with ease   Wheelchair Mobility    Modified Rankin (Stroke Patients Only)       Balance Overall balance assessment: Needs assistance Sitting-balance support: Feet supported Sitting balance-Leahy Scale: Good     Standing balance support: Bilateral upper extremity supported Standing balance-Leahy Scale: Fair Standing balance comment: relies on walker for support.                            Cognition Arousal/Alertness: Awake/alert Behavior During Therapy: WFL for tasks assessed/performed Overall Cognitive Status: Within Functional Limits for tasks assessed                                        Exercises Total Joint Exercises Ankle Circles/Pumps: AROM;Strengthening;Both;10 reps Quad Sets: AROM;Strengthening;Both;10 reps Gluteal Sets: AROM;Strengthening;10 reps;Both Heel Slides: AROM;Strengthening;Both;10 reps Hip ABduction/ADduction: AROM;Strengthening;Right;10 reps Other Exercises Other Exercises: bathroom to void.    General Comments        Pertinent Vitals/Pain Pain Assessment: No/denies pain Pain Location: Stated she feels better than before surgery. Pain Intervention(s): Monitored during session    Home Living Family/patient expects to be discharged to:: Private residence Living Arrangements: Spouse/significant other;Other relatives(mother in law) Available Help at Discharge: Family Type of Home: House Home Access: Stairs to enter Entrance Stairs-Rails: Right;Left;Can reach both Home Layout: Two level;1/2 bath on main level;Able to live on main level with bedroom/bathroom Home Equipment: None;Adaptive equipment Additional Comments: Pt has mother in  laws equipment at home, none of her own    Prior Function Level of Independence: Independent;Independent with assistive device(s)      Comments: Pt uses SPC occasionally  in the am 2/2 R hip pain; indep with ADL; several near falls/slight LOB 2/2 R hip pain   PT Goals (current goals can now be found in the care plan section) Acute Rehab PT Goals Patient Stated Goal: to go home Progress towards PT goals: Progressing toward goals    Frequency    BID      PT Plan Current plan remains appropriate    Co-evaluation              AM-PAC PT "6 Clicks" Mobility   Outcome Measure  Help needed turning from your back to your side while in a flat bed without using bedrails?: A Little Help needed moving from lying on your back to sitting on the side of a flat bed without using bedrails?: A Little Help needed moving to and from a bed to a chair (including a wheelchair)?: A Little Help needed standing up from a chair using your arms (e.g., wheelchair or bedside chair)?: A Little Help needed to walk in hospital room?: A Little Help needed climbing 3-5 steps with a railing? : A Little 6 Click Score: 18    End of Session Equipment Utilized During Treatment: Gait belt Activity Tolerance: Patient tolerated treatment well Patient left: in bed;with call bell/phone within reach;with bed alarm set;with SCD's reapplied Nurse Communication: Mobility status Pain - Right/Left: Right Pain - part of body: Hip     Time: [id number] PT Time Calculation (min) (ACUTE ONLY): 19 min  Charges:  $Gait Training: 8-22 mins $Therapeutic Exercise: 8-22 mins                    Chesley Noon, PTA 03/04/19, 12:28 PM

## 2019-03-04 NOTE — Progress Notes (Signed)
Physical Therapy Treatment Patient Details Name: Heather Potter MRN: [id number] DOB: 1945/11/20 Today's Date: 03/04/2019    History of Present Illness Pt is 73 yo female s/p THA, posterior approach, WBAT. PMH of smoking, HTN, MI, hypothyroidism, HLD, HTN, one kidney. Of note, pt pending cardiology consult after surgery, PT spoke with Dr. Clayborn Bigness, cleared for PT evaluation prior to session.    PT Comments    Out of bed with min guard.  Was able to walk around unit x 1 and use bathroom to void.  Participated in exercises as described below.  Excellent progress today.  Anticipate stair training this pm.   Follow Up Recommendations  Home health PT     Equipment Recommendations       Recommendations for Other Services       Precautions / Restrictions Precautions Precautions: Posterior Hip;Fall Precaution Booklet Issued: No Restrictions Weight Bearing Restrictions: Yes RLE Weight Bearing: Weight bearing as tolerated    Mobility  Bed Mobility Overal bed mobility: Needs Assistance Bed Mobility: Supine to Sit     Supine to sit: Min guard Sit to supine: Min guard   General bed mobility comments: cues for sequencing/precautions  Transfers Overall transfer level: Needs assistance Equipment used: Rolling walker (2 wheeled) Transfers: Sit to/from Stand Sit to Stand: Min guard            Ambulation/Gait Ambulation/Gait assistance: Min guard Gait Distance (Feet): 200 Feet Assistive device: Rolling walker (2 wheeled) Gait Pattern/deviations: Step-through pattern Gait velocity: decreased   General Gait Details: generaly safe and steady.  some drifting noted but able to self correct.   Stairs             Wheelchair Mobility    Modified Rankin (Stroke Patients Only)       Balance Overall balance assessment: Needs assistance Sitting-balance support: Feet supported Sitting balance-Leahy Scale: Good     Standing balance support: Bilateral upper  extremity supported Standing balance-Leahy Scale: Fair Standing balance comment: relies on walker for support.                            Cognition Arousal/Alertness: Awake/alert Behavior During Therapy: WFL for tasks assessed/performed Overall Cognitive Status: Within Functional Limits for tasks assessed                                        Exercises Total Joint Exercises Ankle Circles/Pumps: AROM;Strengthening;Both;10 reps Quad Sets: AROM;Strengthening;Both;10 reps Gluteal Sets: AROM;Strengthening;10 reps;Both Heel Slides: AROM;Strengthening;Both;10 reps Hip ABduction/ADduction: AROM;Strengthening;Right;10 reps Other Exercises Other Exercises: bathroom to void.    General Comments        Pertinent Vitals/Pain Pain Assessment: No/denies pain Pain Intervention(s): Premedicated before session    [place] expects to be discharged to:: Private residence Living Arrangements: Spouse/significant other;Other relatives(mother in law) Available Help at Discharge: Family Type of Home: House Home Access: Stairs to enter Entrance Stairs-Rails: Right;Left;Can reach both Home Layout: Two level;1/2 bath on main level;Able to live on main level with bedroom/bathroom Home Equipment: None;Adaptive equipment Additional Comments: Pt has mother in laws equipment at home, none of her own    Prior Function Level of Independence: Independent;Independent with assistive device(s)      Comments: Pt uses SPC occasionally in the am 2/2 R hip pain; indep with ADL; several near falls/slight LOB 2/2 R hip pain   PT Goals (  current goals can now be found in the care plan section) Acute Rehab PT Goals Patient Stated Goal: to go home Progress towards PT goals: Progressing toward goals    Frequency    BID      PT Plan Current plan remains appropriate    Co-evaluation              AM-PAC PT "6 Clicks" Mobility   Outcome Measure  Help  needed turning from your back to your side while in a flat bed without using bedrails?: A Little Help needed moving from lying on your back to sitting on the side of a flat bed without using bedrails?: A Little Help needed moving to and from a bed to a chair (including a wheelchair)?: A Little Help needed standing up from a chair using your arms (e.g., wheelchair or bedside chair)?: A Little Help needed to walk in hospital room?: A Little Help needed climbing 3-5 steps with a railing? : A Little 6 Click Score: 18    End of Session Equipment Utilized During Treatment: Gait belt Activity Tolerance: Patient tolerated treatment well Patient left: in chair;with chair alarm set;with call bell/phone within reach;with SCD's reapplied   Pain - Right/Left: Right Pain - part of body: Hip     Time: [id number] PT Time Calculation (min) (ACUTE ONLY): 25 min  Charges:  $Gait Training: 8-22 mins $Therapeutic Exercise: 8-22 mins                    Chesley Noon, PTA 03/04/19, 11:03 AM

## 2019-03-04 NOTE — Consult Note (Signed)
Reason for Consult: bradycardia possible arrhythmia Referring Physician:  Dr. Marry Guan orthopedics  Heather Potter is an 73 y.o. female.  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  Denies any previous cardiac history she has hypertension hyperlipidemia but has been relatively healthy she has been suffer from polymyalgia rheumatica requiring steroid therapy which contributed to a vascular necrosis reportedly anesthesia noticed during induction that she had an episode of bradycardia with possible pauses which was very transient patient was treated at the time with significant improvement the patient has had not had any symptoms similar to this prior to surgery or since denies any chest pain or shortness of breath feels reasonably well  Past Medical History:  Diagnosis Date  . Colitis   . History of blood transfusion    D and C and miscarriage  . History of gynecologic surgery    had right ovary removed, unsure about uterus/cervix  . Hyperlipidemia   . Hypertension   . Hypothyroidism   . Lichen planus [id number]   Treated by Dr. Kellie Moor (derm) with methotrexate  . Osteoarthritis   . Osteopenia     Past Surgical History:  Procedure Laterality Date  . APPENDECTOMY    . [place]   colectomy for ? Diverticulits  . DILATION AND CURETTAGE OF UTERUS    . ESOPHAGEAL MANOMETRY N/A 08/21/2015   Procedure: ESOPHAGEAL MANOMETRY (EM);  Surgeon: Mauri Pole, MD;  Location: [facility];  Service: Endoscopy;  Laterality: N/A;  . HERNIA REPAIR     right lower abd  . 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 OOPHORECTOMY     right  . OTHER SURGICAL HISTORY     part of intestine removed  . TOTAL HIP ARTHROPLASTY Right 03/03/2019   Procedure: TOTAL HIP ARTHROPLASTY;  Surgeon: Dereck Leep, MD;  Location: [facility];  Service: Orthopedics;  Laterality: Right;  . TUBAL LIGATION     bilat     Family History  Problem Relation Age of Onset  . Pancreatic cancer Mother   . Heart attack Father   . Liver cancer Brother   . Colon cancer Neg Hx   . Breast cancer Neg Hx     Social History:  reports that she quit smoking about 11 years ago. She has never used smokeless tobacco. She reports current alcohol use. She reports that she does not use drugs.  Allergies: No Known Allergies  Medications: I have reviewed the patient's current medications.  Results for orders placed or performed during the hospital encounter of 03/03/19 (from the past 48 hour(s))  ABO/Rh     Status: None   Collection Time: 03/03/19  6:47 AM  Result Value Ref Range   ABO/RH(D)      Jenetta Downer NEG Performed at [facility], [address], [place], [place] [address]     Dg Hip Port Unilat With Pelvis 1v Right  Result Date: 03/03/2019 CLINICAL DATA:  Status post hip arthroplasty. EXAM: DG HIP (WITH OR WITHOUT PELVIS) 1V PORT RIGHT COMPARISON:  11/02/2018 FINDINGS: Two views of the right hip demonstrate a right hip arthroplasty. The right hip appears to be located without complicating features. The entire right femoral prosthesis is visualized. Evidence for surgical drain and skin staples. Visualized pelvic bony ring is intact. Normal appearance of the left hip. IMPRESSION: Right hip arthroplasty without complicating features. Electronically Signed   By: Scherrie Gerlach.D.  On: 03/03/2019 13:55    Review of Systems  Constitutional: Positive for malaise/fatigue.  HENT: Negative.   Eyes: Negative.   Respiratory: Negative.   Cardiovascular: Negative.   Gastrointestinal: Negative.   Genitourinary: Negative.   Musculoskeletal: Positive for joint pain and myalgias.  Skin: Negative.   Neurological: Negative.   Endo/Heme/Allergies: Negative.   Psychiatric/Behavioral: Negative.    Blood pressure (!) 110/53, pulse 65, temperature 97.9 F (36.6 C), temperature source Oral, resp. rate 16, SpO2 94  %. Physical Exam  Nursing note and vitals reviewed. Constitutional: She is oriented to person, place, and time. She appears well-developed and well-nourished.  HENT:  Head: Normocephalic and atraumatic.  Eyes: Pupils are equal, round, and reactive to light. Conjunctivae and EOM are normal.  Neck: Normal range of motion. Neck supple.  Cardiovascular: Normal rate, regular rhythm and normal heart sounds.  Respiratory: Effort normal and breath sounds normal.  GI: Soft. Bowel sounds are normal.  Musculoskeletal:     Right hip: She exhibits decreased range of motion, decreased strength and tenderness.     Comments: Post right hip total replacement postop with mild tenderness reduced range of motion getting physical therapy  Neurological: She is alert and oriented to person, place, and time. She has normal reflexes.  Skin: Skin is warm and dry.  Psychiatric: She has a normal mood and affect.    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  plan  agree with postoperative care  low risk for significant arrhythmia or cardiac issues  this appears to be related to anesthesia so therefore is not likely to recur  recommend conservative medical therapy  continue rehab  continue therapy for polymyalgia rheumatica  Tonee Silverstein D Tenesia Escudero 03/04/2019, 2:16 PM

## 2019-03-05 MED ORDER — APIXABAN 2.5 MG PO TABS: 2.5000 mg | ORAL_TABLET | Freq: Two times a day (BID) | ORAL | 0 refills | Status: DC

## 2019-03-05 MED ORDER — CELECOXIB 200 MG PO CAPS: 200.0000 mg | ORAL_CAPSULE | Freq: Two times a day (BID) | ORAL | 0 refills | Status: AC

## 2019-03-05 MED ORDER — OXYCODONE HCL 5 MG PO TABS: 5.0000 mg | ORAL_TABLET | ORAL | 0 refills | Status: DC | PRN

## 2019-03-05 NOTE — TOC Progression Note (Signed)
Transition of Care [facility]) - Progression Note    Patient Details  Name: KIARALIZ TRATHEN MRN: [id number] Date of Birth: January 10, 1946  Transition of Care [facility]) CM/SW [place], RN Phone Number: 03/05/2019, 12:25 PM  Clinical Narrative:     [facility] Benefit line at [phone], after a lengthy hold time, They provided a copay of $35.00 for 10 Eliquis Pills 2.5 Mg I notified The PA of the cost and he explained it to the patient that she will use the Card for 30 days and then have an RX for the remaining 5 days of the medication to pay separately.  I explained to him that the patient will need 2 separate RX   Expected Discharge Plan: [place] Barriers to Discharge: Barriers Resolved  Expected Discharge Plan and Services Expected Discharge Plan: [place]   Discharge Planning Services: CM Consult Post Acute Care Choice: [place] arrangements for the past 2 months: Single Family Home                 DME Arranged: 3-N-1, Walker rolling DME Agency: [employer] Date DME Agency Contacted: 03/04/19 Time DME Agency Contacted: [id number] Representative spoke with at DME Agency: Jeneen Rinks [place]: PT [place]: [facility] [employer] Date [place]: 03/04/19 Time [place]: 1148 Representative spoke with at [place]: [place] ([place]) Interventions    Readmission Risk Interventions No flowsheet data found.

## 2019-03-05 NOTE — TOC Transition Note (Signed)
Transition of Care [facility]) - Progression Note    Patient Details  Name: ROZELL SAMMET MRN: [id number] Date of Birth: [birth year]  Transition of Care [facility]) CM/SW [place], RN Phone Number: 03/05/2019, 11:49 AM  Clinical Narrative:     Provided the patient a discount card for Eloquis that makes it free for 30 days.  She is unable to give self injection due to arthritis in her hands. The PA will provide a written RX to use with the Eloquis card. The patient has RW and BSC in room and is [facility] with [facility] PT, No additional needs  Expected Discharge Plan: [place] Barriers to Discharge: Continued Medical Work up  Expected Discharge Plan and Services Expected Discharge Plan: [place]   Discharge Planning Services: CM Consult Post Acute Care Choice: [place] arrangements for the past 2 months: Single Family Home                 DME Arranged: 3-N-1, Walker rolling DME Agency: [employer] Date DME Agency Contacted: 03/04/19 Time DME Agency Contacted: [id number] Representative spoke with at DME Agency: Jeneen Rinks HH Arranged: PT [place]: [facility] [employer] Date [place]: 03/04/19 Time [place]: 1148 Representative spoke with at [place]: [place] ([place]) Interventions    Readmission Risk Interventions No flowsheet data found.

## 2019-03-05 NOTE — Plan of Care (Signed)
Patient met goals adequately for discharge to home with spouse.

## 2019-03-05 NOTE — Progress Notes (Signed)
  Subjective: 2 Days Post-Op Procedure(s) (LRB): TOTAL HIP ARTHROPLASTY (Right) Patient reports pain as  well controlled.   Patient is well, and has had no acute complaints or problems Plan is to go Home after hospital stay. Negative for chest pain and shortness of breath Fever: no Gastrointestinal: negative for nausea and vomiting.   Patient has had a bowel movement.  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  Patient states no one else in the home will be able to assist her.  Objective: Vital signs in last 24 hours: Temp:  [97.9 F (36.6 C)-98.4 F (36.9 C)] 98.4 F (36.9 C) (10/16 0812) Pulse Rate:  [65-70] 70 (10/16 0812) Resp:  [16-17] 17 (10/16 0812) BP: (108-136)/(53-71) 136/71 (10/16 0812) SpO2:  [93 %-96 %] 93 % (10/16 0812)  Intake/Output from previous day:  Intake/Output Summary (Last 24 hours) at 03/05/2019 0829 Last data filed at 03/04/2019 2337 Gross per 24 hour  Intake 480 ml  Output 75 ml  Net 405 ml    Intake/Output this shift: No intake/output data recorded.  Labs: No results for input(s): HGB in the last 72 hours. No results for input(s): WBC, RBC, HCT, PLT in the last 72 hours. No results for input(s): NA, K, CL, CO2, BUN, CREATININE, GLUCOSE, CALCIUM in the last 72 hours. No results for input(s): LABPT, INR in the last 72 hours.   EXAM General - Patient is Alert, Appropriate and Oriented Extremity - Neurovascular intact Dorsiflexion/Plantar flexion intact Incision: scant drainage and Drain remains in place. Compartment soft Dressing/Incision - dry, minimal dried blood present Motor Function - intact, moving foot and toes well on exam.  Cardiovascular- Regular rate and rhythm, no murmurs/rubs/gallops Respiratory- Lungs clear to auscultation bilaterally  Assessment/Plan: 2 Days Post-Op Procedure(s) (LRB): TOTAL HIP ARTHROPLASTY (Right) Active Problems:   H/O total  hip arthroplasty  Estimated body mass index is 27.07 kg/m as calculated from the following:   Height as of 02/23/19: 5' 2.5" (1.588 m).   Weight as of 02/23/19: 68.2 kg. Advance diet Up with therapy Discharge home with home health  Drain removed and honeycomb dressing replaced.  DVT Prophylaxis - Lovenox, Ted hose and foot pumps Weight-Bearing as tolerated to right leg  I will check with care management to see about the price of Eliquis as a substitute for Lovenox.  Cassell Smiles, PA-C [facility] Orthopa[facility] 03/05/2019, 8:29 AM

## 2019-03-05 NOTE — Progress Notes (Signed)
Discharge instructions and prescriptions given to patient with verbalization of understanding. Time allowed for questions. Patient wheeled out

## 2019-03-05 NOTE — Progress Notes (Signed)
Occupational Therapy Treatment Patient Details Name: Heather Potter MRN: [id number] DOB: 03/17/1946 Today's Date: 03/05/2019    History of present illness Pt is 73 yo female s/p THA, posterior approach, WBAT. PMH of smoking, HTN, MI, hypothyroidism, HLD, HTN, one kidney. Of note, pt pending cardiology consult after surgery, PT spoke with Dr. Clayborn Bigness, cleared for PT evaluation prior to session.   OT comments  Pt re-instructed in posterior THPs, AE/DME, falls prevention, pet care considerations, and home/routines modifications; pt able to return verbalize sequencing and plan for ADL tasks and functional ADL transfers at home while maintaining precautions. Pt continues to benefit from skilled OT services while hospitalized.     Follow Up Recommendations  No OT follow up    Equipment Recommendations  3 in 1 bedside commode    Recommendations for Other Services      Precautions / Restrictions Precautions Precautions: Posterior Hip;Fall Restrictions Weight Bearing Restrictions: Yes RLE Weight Bearing: Weight bearing as tolerated       Mobility Bed Mobility Overal bed mobility: Modified Independent                Transfers Overall transfer level: Needs assistance Equipment used: Rolling walker (2 wheeled) Transfers: Sit to/from Stand Sit to Stand: Supervision         General transfer comment: Pt educated that she can use her RLE for transfers, pt reported she is attempting to adhere to her posterior hip precautions    Balance Overall balance assessment: Modified Independent                                         ADL either performed or assessed with clinical judgement   ADL Overall ADL's : Needs assistance/impaired                                       General ADL Comments: Min A for LOB bathing and dressing, Max A for compression stocking mgt; CGA for functional ADL transfers; spouse able to provide needed level of assist  per pt     Vision Baseline Vision/History: Wears glasses Wears Glasses: Reading only Patient Visual Report: No change from baseline     Perception     Praxis      Cognition Arousal/Alertness: Awake/alert Behavior During Therapy: WFL for tasks assessed/performed Overall Cognitive Status: Within Functional Limits for tasks assessed                                          Exercises  Other Exercises: Pt re-instructed in posterior THPs, AE/DME, falls prevention, pet care considerations, and home/routines modifications; pt able to return verbalize sequencing and plan for ADL tasks and functional ADL transfers at home while maintaining precautions   Shoulder Instructions       General Comments      Pertinent Vitals/ Pain       Pain Assessment: No/denies pain Pain Location: a bit stiff this AM Pain Intervention(s): Limited activity within patient's tolerance;Monitored during session;Ice applied;Repositioned  Home Living  Prior Functioning/Environment              Frequency  Min 1X/week        Progress Toward Goals  OT Goals(current goals can now be found in the care plan section)  Progress towards OT goals: Progressing toward goals  Acute Rehab OT Goals Patient Stated Goal: to go home OT Goal Formulation: With patient Time For Goal Achievement: 03/18/19 Potential to Achieve Goals: Good  Plan Discharge plan remains appropriate;Frequency remains appropriate    Co-evaluation                 AM-PAC OT "6 Clicks" Daily Activity     Outcome Measure   Help from another person eating meals?: None Help from another person taking care of personal grooming?: None Help from another person toileting, which includes using toliet, bedpan, or urinal?: A Little Help from another person bathing (including washing, rinsing, drying)?: A Little Help from another person to put on and taking  off regular upper body clothing?: None Help from another person to put on and taking off regular lower body clothing?: A Little 6 Click Score: 21    End of Session    OT Visit Diagnosis: Other abnormalities of gait and mobility (R26.89);Muscle weakness (generalized) (M62.81)   Activity Tolerance Patient tolerated treatment well   Patient Left in chair;with call bell/phone within reach;with chair alarm set   Nurse Communication          Time: [id number] OT Time Calculation (min): 10 min  Charges: OT General Charges $OT Visit: 1 Visit OT Treatments $Self Care/Home Management : 8-22 mins  Jeni Salles, MPH, MS, OTR/L ascom [phone] 03/05/19, 12:08 PM

## 2019-03-05 NOTE — Discharge Summary (Addendum)
Physician Discharge Summary  Patient ID: Heather Potter MRN: [id number] DOB/AGE: May 09, 1946 73 y.o.  Admit date: 03/03/2019 Discharge date: 03/05/2019  Admission Diagnoses:  AVASCULAR NECROSIS OF RIGHT HIP.  Surgeries: PROCEDURE:  Right total hip arthroplasty  SURGEON:  Marciano Sequin. M.D.  ASSISTANT: Cassell Smiles, PA-C (present and scrubbed throughout the case, critical for assistance with exposure, retraction, instrumentation, and closure)  ANESTHESIA: general  ESTIMATED BLOOD LOSS: 250 mL  FLUIDS REPLACED: 900 mL of crystalloid  DRAINS: 2 medium drains to a Hemovac reservoir  IMPLANTS UTILIZED: DePuy 12 mm large stature AML femoral stem, 52 mm OD Pinnacle 100 acetabular component, +4 mm 10 degree Pinnacle Marathon polyethylene insert, and a 36 mm M-SPEC +1.5 mm hip ball   Discharge Diagnoses: Patient Active Problem List   Diagnosis Date Noted  . H/O total hip arthroplasty 03/03/2019  . Osteoarthritis of right hip 01/03/2019  . Avascular necrosis of bone of right hip ([place]) 11/11/2018  . Generalized osteoarthritis of hand 11/11/2018  . PMR (polymyalgia rheumatica) (HCC) 05/26/2018  . Lichen planus [id number]  . Well woman exam 08/28/2016  . Superficial varicosities 09/13/2015  . Osteopenia   . Hypothyroidism 10/27/2009  . HLD (hyperlipidemia) 10/27/2009  . Essential hypertension 10/27/2009  . Osteoarthritis 10/27/2009  . NEPHRECTOMY, HX OF 10/27/2009    Past Medical History:  Diagnosis Date  . Colitis   . History of blood transfusion    D and C and miscarriage  . History of gynecologic surgery    had right ovary removed, unsure about uterus/cervix  . Hyperlipidemia   . Hypertension   . Hypothyroidism   . Lichen planus [id number]   Treated by Dr. Kellie Moor (derm) with methotrexate  . Osteoarthritis   . Osteopenia      Transfusion: n/a   Consultants (if any): Treatment Team:  Yolonda Kida, MD  Discharged Condition: Improved   Hospital Course: Heather Potter is an 73 y.o. female who was admitted 03/03/2019 with a diagnosis of right hip avascular necrosis and went to the operating room on 03/03/2019 and underwent right total hip arthroplasty. The patient received perioperative antibiotics for prophylaxis (see below). The patient tolerated the procedure well and was transported to PACU in stable condition. After meeting PACU criteria, the patient was subsequently transferred to the Orthopaedics/Rehabilitation unit. Cardiology consulted in regards to bradycardia noted during induction of anesthesia.  The patient received DVT prophylaxis in the form of early mobilization, Lovenox, Foot Pumps and TED hose. A sacral pad had been placed and heels were elevated off of the bed with rolled towels in order to protect skin integrity. Foley catheter was discontinued on postoperative day #1. Wound drains were discontinued on postoperative day #2. The surgical incision was healing well without signs of infection.  Physical therapy was initiated postoperatively for transfers, gait training, and strengthening. Occupational therapy was initiated for activities of daily living and evaluation for assisted devices. Rehabilitation goals were reviewed in detail with the patient. The patient made steady progress with physical therapy and physical therapy recommended discharge to Home.   The patient achieved his preliminary goals of this hospitalization and was felt to be medically and orthopaedically appropriate for discharge.  She was given perioperative antibiotics:  Anti-infectives (From admission, onward)   Start     Dose/Rate Route Frequency Ordered Stop   03/03/19 1400  ceFAZolin (ANCEF) IVPB 2g/100 mL premix     2 g 200 mL/hr over 30 Minutes Intravenous Every 6 hours 03/03/19 1211 03/04/19 0839  03/03/19 0630  ceFAZolin (ANCEF) IVPB 2g/100 mL premix     2 g 200 mL/hr over 30 Minutes Intravenous On call to O.R. 03/03/19 [id number] 03/03/19  0749   03/03/19 0622  ceFAZolin (ANCEF) 2-4 GM/100ML-% IVPB    Note to Pharmacy: Trudie Reed   : cabinet override      03/03/19 0622 03/03/19 0734    .  Recent vital signs:  Vitals:   03/04/19 2336 03/05/19 0812  BP: (!) 123/56 136/71  Pulse: 67 70  Resp: 17 17  Temp: 98.3 F (36.8 C) 98.4 F (36.9 C)  SpO2: 96% 93%    Recent laboratory studies:  No results for input(s): WBC, HGB, HCT, PLT, K, CL, CO2, BUN, CREATININE, GLUCOSE, CALCIUM, LABPT, INR in the last 72 hours.  Diagnostic Studies: Dg Hip Port Unilat With Pelvis 1v Right  Result Date: 03/03/2019 CLINICAL DATA:  Status post hip arthroplasty. EXAM: DG HIP (WITH OR WITHOUT PELVIS) 1V PORT RIGHT COMPARISON:  11/02/2018 FINDINGS: Two views of the right hip demonstrate a right hip arthroplasty. The right hip appears to be located without complicating features. The entire right femoral prosthesis is visualized. Evidence for surgical drain and skin staples. Visualized pelvic bony ring is intact. Normal appearance of the left hip. IMPRESSION: Right hip arthroplasty without complicating features. Electronically Signed   By: Markus Daft M.D.   On: 03/03/2019 13:55    Discharge Medications:   Allergies as of 03/05/2019   No Known Allergies     Medication List    STOP taking these medications   acetaminophen-codeine 300-30 MG tablet Commonly known as: TYLENOL #3   aspirin EC 81 MG tablet   Turmeric 400 MG Caps     TAKE these medications   acetaminophen 500 MG tablet Commonly known as: TYLENOL Take 500 mg by mouth every 6 (six) hours as needed for mild pain or moderate pain.   amLODipine 10 MG tablet Commonly known as: NORVASC Take 1 tablet (10 mg total) by mouth daily.   apixaban 2.5 MG Tabs tablet Commonly known as: Eliquis Take 1 tablet (2.5 mg total) by mouth 2 (two) times daily.   apixaban 2.5 MG Tabs tablet Commonly known as: Eliquis Take 1 tablet (2.5 mg total) by mouth 2 (two) times daily for 5 days. Start  taking on: April 05, 2019   CALCIUM 1200 PO Take 600 mg by mouth 2 (two) times daily.   celecoxib 200 MG capsule Commonly known as: CELEBREX Take 1 capsule (200 mg total) by mouth 2 (two) times daily.   cholecalciferol 10 MCG (400 UNIT) Tabs tablet Commonly known as: VITAMIN D3 Take 400 Units by mouth at bedtime.   cyclobenzaprine 10 MG tablet Commonly known as: FLEXERIL Take 0.5-1 tablets (5-10 mg total) by mouth at bedtime as needed for muscle spasms.   gabapentin 100 MG capsule Commonly known as: NEURONTIN Take 1 capsule by mouth twice daily   hydrochlorothiazide 25 MG tablet Commonly known as: HYDRODIURIL Take 1 tablet (25 mg total) by mouth daily.   levothyroxine 100 MCG tablet Commonly known as: SYNTHROID Take 1 tablet (100 mcg total) by mouth daily.   oxyCODONE 5 MG immediate release tablet Commonly known as: Oxy IR/ROXICODONE Take 1 tablet (5 mg total) by mouth every 4 (four) hours as needed for moderate pain (pain score 4-6).   predniSONE 1 MG tablet Commonly known as: DELTASONE Take 2 tablets (2 mg total) by mouth daily with breakfast. Will be taking with 5 mg tablets for titration.  What changed:   how much to take  additional instructions   simvastatin 20 MG tablet Commonly known as: ZOCOR Take 0.5 tablets (10 mg total) by mouth daily at 6 PM.   traMADol 50 MG tablet Commonly known as: ULTRAM Take 1 tablet (50 mg total) by mouth every 12 (twelve) hours as needed for severe pain. What changed: when to take this            Durable Medical Equipment  (From admission, onward)         Start     Ordered   03/03/19 1212  DME Walker rolling  Once    Question:  Patient needs a walker to treat with the following condition  Answer:  S/P total hip arthroplasty   03/03/19 1211   03/03/19 1212  DME Bedside commode  Once    Question:  Patient needs a bedside commode to treat with the following condition  Answer:  S/P total hip arthroplasty   03/03/19  1211          Disposition: home w/ home health PT     Follow-up Information    Dereck Leep, MD On 04/13/2019.   Specialty: Orthopedic Surgery Why: at 11:15am Contact information: [place] [id number] [place], PA-C 03/05/2019, 12:35 PM

## 2019-03-05 NOTE — Care Management Important Message (Signed)
Important Message  Patient Details  Name: SAMAN YAZEL MRN: [id number] Date of Birth: 07-21-1945   Medicare Important Message Given:  Yes     Dannette Barbara 03/05/2019, 10:23 AM

## 2019-03-05 NOTE — Progress Notes (Signed)
Physical Therapy Treatment Patient Details Name: Heather Potter MRN: [id number] DOB: May 02, 1946 Today's Date: 03/05/2019    History of Present Illness Pt is 73 yo female s/p THA, posterior approach, WBAT. PMH of smoking, HTN, MI, hypothyroidism, HLD, HTN, one kidney. Of note, pt pending cardiology consult after surgery, PT spoke with Dr. Clayborn Bigness, cleared for PT evaluation prior to session.    PT Comments    Patient transferring from supine to sit EOB with RN when PT entered the room, preparing to ambulate to the bathroom. Pt demonstrated bed mobility ModI, and sit <> stand with RW and supervision. Pt still tended to transfers with NWB on RLE to attempt to adhere to her posterior hip precautions, educated about proper form, verbalized understanding. Pt ambulated ~153ft with RW and supervision, some generalized drifting noted, but pt was able to correct independently, decreased stride length noted on RLE, as well as decreased gait velocity. Pt also performed seated therapeutic exercises with verbal cues for form. Overall the patient is progressing nicely towards goals and would benefit from further skilled PT intervention to return to PLOF as able.     Follow Up Recommendations  Home health PT     Equipment Recommendations  Rolling walker with 5" wheels;3in1 (PT)    Recommendations for Other Services OT consult     Precautions / Restrictions Precautions Precautions: Posterior Hip;Fall Restrictions Weight Bearing Restrictions: Yes RLE Weight Bearing: Weight bearing as tolerated    Mobility  Bed Mobility Overal bed mobility: Modified Independent                Transfers Overall transfer level: Needs assistance Equipment used: Rolling walker (2 wheeled) Transfers: Sit to/from Stand Sit to Stand: Supervision         General transfer comment: Pt educated that she can use her RLE for transfers, pt reported she is attempting to adhere to her posterior hip  precautions  Ambulation/Gait Ambulation/Gait assistance: Supervision Gait Distance (Feet): 170 Feet Assistive device: Rolling walker (2 wheeled)   Gait velocity: decreased   General Gait Details: generaly safe and steady.  some drifting noted but able to self correct.   Stairs             Wheelchair Mobility    Modified Rankin (Stroke Patients Only)       Balance Overall balance assessment: Modified Independent                                          Cognition Arousal/Alertness: Awake/alert Behavior During Therapy: WFL for tasks assessed/performed Overall Cognitive Status: Within Functional Limits for tasks assessed                                        Exercises Total Joint Exercises Towel Squeeze: AROM;Both;Strengthening;Seated;15 reps Short Arc Quad: AROM;Strengthening;Right;Seated;15 reps Long Arc Quad: AROM;Strengthening;Right;15 reps;Seated Other Exercises Other Exercises: bathroom to void.    General Comments        Pertinent Vitals/Pain Pain Assessment: No/denies pain Pain Location: a bit stiff this AM Pain Intervention(s): Limited activity within patient's tolerance;Monitored during session;Ice applied;Repositioned    Home Living                      Prior Function  PT Goals (current goals can now be found in the care plan section) Progress towards PT goals: Progressing toward goals    Frequency    BID      PT Plan Discharge plan needs to be updated    Co-evaluation              AM-PAC PT "6 Clicks" Mobility   Outcome Measure  Help needed turning from your back to your side while in a flat bed without using bedrails?: None Help needed moving from lying on your back to sitting on the side of a flat bed without using bedrails?: None Help needed moving to and from a bed to a chair (including a wheelchair)?: None Help needed standing up from a chair using your arms  (e.g., wheelchair or bedside chair)?: None Help needed to walk in hospital room?: A Little Help needed climbing 3-5 steps with a railing? : A Little 6 Click Score: 22    End of Session Equipment Utilized During Treatment: Gait belt Activity Tolerance: Patient tolerated treatment well Patient left: with call bell/phone within reach;with SCD's reapplied;in chair;with chair alarm set Nurse Communication: Mobility status PT Visit Diagnosis: Muscle weakness (generalized) (M62.81);Pain;Other abnormalities of gait and mobility (R26.89) Pain - Right/Left: Right Pain - part of body: Hip     Time: [id number] PT Time Calculation (min) (ACUTE ONLY): 20 min  Charges:  $Therapeutic Exercise: 8-22 mins                     Lieutenant Diego PT, DPT 9:39 AM,03/05/19 [phone]

## 2019-03-08 ENCOUNTER — Telehealth: Payer: Self-pay | Admitting: Family Medicine

## 2019-03-08 NOTE — Telephone Encounter (Signed)
Please call patient and ask her if she is taking additional pain mediation following her hip surgery? If so, how often?

## 2019-03-08 NOTE — Telephone Encounter (Signed)
Patient called to get a refill on Tramadol.  Patient just had hip surgery. Patient said she has a few pills left.  Patient's been taking the medication 2 x a day. Patient uses [employer].

## 2019-03-08 NOTE — Telephone Encounter (Signed)
Last OV 01/11/2019 CPE Last refilled 01/29/2019 #60 x 0rf Upcoming appt With Family Medicine Elby Beck, FNP) 07/14/2019 at 10:30 AM

## 2019-03-10 ENCOUNTER — Other Ambulatory Visit: Payer: Self-pay | Admitting: Family Medicine

## 2019-03-10 DIAGNOSIS — Z7901 Long term (current) use of anticoagulants: Secondary | ICD-10-CM | POA: Diagnosis not present

## 2019-03-10 DIAGNOSIS — M19019 Primary osteoarthritis, unspecified shoulder: Secondary | ICD-10-CM

## 2019-03-10 DIAGNOSIS — E785 Hyperlipidemia, unspecified: Secondary | ICD-10-CM | POA: Diagnosis not present

## 2019-03-10 DIAGNOSIS — E039 Hypothyroidism, unspecified: Secondary | ICD-10-CM | POA: Diagnosis not present

## 2019-03-10 DIAGNOSIS — Z9181 History of falling: Secondary | ICD-10-CM | POA: Diagnosis not present

## 2019-03-10 DIAGNOSIS — Z471 Aftercare following joint replacement surgery: Secondary | ICD-10-CM | POA: Diagnosis not present

## 2019-03-10 DIAGNOSIS — L439 Lichen planus, unspecified: Secondary | ICD-10-CM | POA: Diagnosis not present

## 2019-03-10 DIAGNOSIS — Z96641 Presence of right artificial hip joint: Secondary | ICD-10-CM | POA: Diagnosis not present

## 2019-03-10 DIAGNOSIS — I10 Essential (primary) hypertension: Secondary | ICD-10-CM | POA: Diagnosis not present

## 2019-03-10 DIAGNOSIS — M8580 Other specified disorders of bone density and structure, unspecified site: Secondary | ICD-10-CM | POA: Diagnosis not present

## 2019-03-10 DIAGNOSIS — K529 Noninfective gastroenteritis and colitis, unspecified: Secondary | ICD-10-CM | POA: Diagnosis not present

## 2019-03-10 MED ORDER — TRAMADOL HCL 50 MG PO TABS: 50.0000 mg | ORAL_TABLET | Freq: Two times a day (BID) | ORAL | 0 refills | Status: DC | PRN

## 2019-03-10 NOTE — Telephone Encounter (Addendum)
Pt was prescribed Gabapentin 100mg , take 1 tab BID, but is not taking this.  Oxycodone 5mg , 1/2 tab BID prn - pt has not started taking this.   Pt was taking Acet-Codeine but she has finished this.   Taking Tramadol 50mg  2 daily PRN and can take Tylenol 500mg  if needed for increased pain.   Pt also taking Celebrex (generic) 200mg  BID  Still taking Prednisone, 3mg  daily    Will send to [facility] as FYI

## 2019-03-10 NOTE — Telephone Encounter (Signed)
Tramadol refill sent to patient's pharmacy.

## 2019-03-15 DIAGNOSIS — Z79899 Other long term (current) drug therapy: Secondary | ICD-10-CM | POA: Diagnosis not present

## 2019-03-15 DIAGNOSIS — M353 Polymyalgia rheumatica: Secondary | ICD-10-CM | POA: Diagnosis not present

## 2019-03-23 DIAGNOSIS — Z7901 Long term (current) use of anticoagulants: Secondary | ICD-10-CM | POA: Diagnosis not present

## 2019-03-23 DIAGNOSIS — E785 Hyperlipidemia, unspecified: Secondary | ICD-10-CM | POA: Diagnosis not present

## 2019-03-23 DIAGNOSIS — Z96641 Presence of right artificial hip joint: Secondary | ICD-10-CM | POA: Diagnosis not present

## 2019-03-23 DIAGNOSIS — I10 Essential (primary) hypertension: Secondary | ICD-10-CM | POA: Diagnosis not present

## 2019-03-23 DIAGNOSIS — Z9181 History of falling: Secondary | ICD-10-CM | POA: Diagnosis not present

## 2019-03-23 DIAGNOSIS — L439 Lichen planus, unspecified: Secondary | ICD-10-CM | POA: Diagnosis not present

## 2019-03-23 DIAGNOSIS — Z471 Aftercare following joint replacement surgery: Secondary | ICD-10-CM | POA: Diagnosis not present

## 2019-03-23 DIAGNOSIS — K529 Noninfective gastroenteritis and colitis, unspecified: Secondary | ICD-10-CM | POA: Diagnosis not present

## 2019-03-23 DIAGNOSIS — M8580 Other specified disorders of bone density and structure, unspecified site: Secondary | ICD-10-CM | POA: Diagnosis not present

## 2019-03-23 DIAGNOSIS — E039 Hypothyroidism, unspecified: Secondary | ICD-10-CM | POA: Diagnosis not present

## 2019-04-07 DIAGNOSIS — M353 Polymyalgia rheumatica: Secondary | ICD-10-CM | POA: Diagnosis not present

## 2019-04-07 DIAGNOSIS — Z7952 Long term (current) use of systemic steroids: Secondary | ICD-10-CM | POA: Diagnosis not present

## 2019-04-07 DIAGNOSIS — M159 Polyosteoarthritis, unspecified: Secondary | ICD-10-CM | POA: Diagnosis not present

## 2019-04-13 DIAGNOSIS — M87051 Idiopathic aseptic necrosis of right femur: Secondary | ICD-10-CM | POA: Diagnosis not present

## 2019-04-20 ENCOUNTER — Encounter: Payer: Self-pay | Admitting: Family Medicine

## 2019-04-20 NOTE — Telephone Encounter (Signed)
Appointment 12/4 Pt aware

## 2019-04-22 ENCOUNTER — Other Ambulatory Visit: Payer: Self-pay | Admitting: Family Medicine

## 2019-04-22 DIAGNOSIS — M19019 Primary osteoarthritis, unspecified shoulder: Secondary | ICD-10-CM

## 2019-04-22 MED ORDER — TRAMADOL HCL 50 MG PO TABS: 50.0000 mg | ORAL_TABLET | Freq: Two times a day (BID) | ORAL | 0 refills | Status: DC | PRN

## 2019-04-22 NOTE — Telephone Encounter (Signed)
Last filled on 03/10/2019 #60 with 0 refill. Last OV 01/11/2019 CPE. Next appointment on 07/14/2019 follow up.

## 2019-04-23 ENCOUNTER — Other Ambulatory Visit (INDEPENDENT_AMBULATORY_CARE_PROVIDER_SITE_OTHER): Payer: PPO

## 2019-04-23 ENCOUNTER — Other Ambulatory Visit: Payer: Self-pay

## 2019-04-23 ENCOUNTER — Encounter: Payer: Self-pay | Admitting: Family Medicine

## 2019-04-23 DIAGNOSIS — E039 Hypothyroidism, unspecified: Secondary | ICD-10-CM | POA: Diagnosis not present

## 2019-04-23 LAB — TSH: TSH: 0.46 u[IU]/mL (ref 0.35–4.50)

## 2019-05-05 DIAGNOSIS — M353 Polymyalgia rheumatica: Secondary | ICD-10-CM | POA: Diagnosis not present

## 2019-05-05 DIAGNOSIS — Z7952 Long term (current) use of systemic steroids: Secondary | ICD-10-CM | POA: Diagnosis not present

## 2019-05-05 DIAGNOSIS — M1990 Unspecified osteoarthritis, unspecified site: Secondary | ICD-10-CM | POA: Diagnosis not present

## 2019-05-09 DIAGNOSIS — Z471 Aftercare following joint replacement surgery: Secondary | ICD-10-CM | POA: Diagnosis not present

## 2019-05-12 ENCOUNTER — Other Ambulatory Visit: Payer: Self-pay

## 2019-05-12 ENCOUNTER — Other Ambulatory Visit: Payer: Self-pay | Admitting: Family Medicine

## 2019-05-12 DIAGNOSIS — I10 Essential (primary) hypertension: Secondary | ICD-10-CM

## 2019-05-12 DIAGNOSIS — M19042 Primary osteoarthritis, left hand: Secondary | ICD-10-CM

## 2019-05-12 DIAGNOSIS — M19019 Primary osteoarthritis, unspecified shoulder: Secondary | ICD-10-CM

## 2019-05-12 DIAGNOSIS — M19041 Primary osteoarthritis, right hand: Secondary | ICD-10-CM

## 2019-05-12 DIAGNOSIS — E785 Hyperlipidemia, unspecified: Secondary | ICD-10-CM

## 2019-05-12 DIAGNOSIS — E039 Hypothyroidism, unspecified: Secondary | ICD-10-CM

## 2019-05-12 MED ORDER — SIMVASTATIN 20 MG PO TABS: 10.0000 mg | ORAL_TABLET | Freq: Every day | ORAL | 0 refills | Status: DC

## 2019-05-12 MED ORDER — AMLODIPINE BESYLATE 10 MG PO TABS: 10.0000 mg | ORAL_TABLET | Freq: Every day | ORAL | 0 refills | Status: DC

## 2019-05-12 MED ORDER — HYDROCHLOROTHIAZIDE 25 MG PO TABS: 25.0000 mg | ORAL_TABLET | Freq: Every day | ORAL | 0 refills | Status: DC

## 2019-05-12 NOTE — Telephone Encounter (Signed)
Pt is changing from mail order pharmacy to [address] and there are 3 rx with no refills and pt request 3 mth refill for simvastatin 20 mg # 45, amlodipine 10 mg # 90 and hydrochlorothiazide 25 mg # 90 to [facility] [address]. Pt has appt already scheduled for 07/14/19 with Glenda Chroman FNP. Refills done per protocol and pt voiced understanding and appreciative.

## 2019-05-12 NOTE — Telephone Encounter (Signed)
Last office visit 01/11/2019 for CPE.  Last refilled 11/19/2018 for #180 with 1 refill.  Next Appt: 07/14/2019 for 6 month follow up.

## 2019-05-24 ENCOUNTER — Other Ambulatory Visit: Payer: Self-pay | Admitting: Family Medicine

## 2019-05-24 DIAGNOSIS — M19019 Primary osteoarthritis, unspecified shoulder: Secondary | ICD-10-CM

## 2019-05-25 ENCOUNTER — Encounter: Payer: Self-pay | Admitting: Family Medicine

## 2019-05-25 NOTE — Telephone Encounter (Signed)
Last filled on 04/22/2019 #60 with 0 refill. LOV 01/11/19 CPE and next appointment on 07/14/19

## 2019-05-25 NOTE — Telephone Encounter (Signed)
Noted  

## 2019-05-26 MED ORDER — TRAMADOL HCL 50 MG PO TABS: 50.0000 mg | ORAL_TABLET | Freq: Two times a day (BID) | ORAL | 0 refills | Status: DC | PRN

## 2019-05-27 ENCOUNTER — Encounter: Payer: Self-pay | Admitting: Family Medicine

## 2019-06-07 DIAGNOSIS — Z7952 Long term (current) use of systemic steroids: Secondary | ICD-10-CM | POA: Diagnosis not present

## 2019-06-07 DIAGNOSIS — M353 Polymyalgia rheumatica: Secondary | ICD-10-CM | POA: Diagnosis not present

## 2019-06-22 ENCOUNTER — Other Ambulatory Visit: Payer: Self-pay | Admitting: Family Medicine

## 2019-06-22 DIAGNOSIS — M19019 Primary osteoarthritis, unspecified shoulder: Secondary | ICD-10-CM

## 2019-06-23 ENCOUNTER — Encounter: Payer: Self-pay | Admitting: Family Medicine

## 2019-06-23 MED ORDER — TRAMADOL HCL 50 MG PO TABS: 50.0000 mg | ORAL_TABLET | Freq: Two times a day (BID) | ORAL | 0 refills | Status: DC | PRN

## 2019-06-23 NOTE — Telephone Encounter (Signed)
Last filled on 04/22/2019 #60 with 0 refill. LOV 01/11/19 CPE and next appointment on 07/14/19

## 2019-07-03 ENCOUNTER — Ambulatory Visit: Payer: PPO | Attending: Internal Medicine

## 2019-07-03 DIAGNOSIS — Z23 Encounter for immunization: Secondary | ICD-10-CM

## 2019-07-03 NOTE — Progress Notes (Signed)
   Covid-19 Vaccination Clinic  Name:  Heather Potter    MRN: [id number] DOB: June 26, 1945  07/03/2019  [name] was observed post Covid-19 immunization for 15 minutes without incidence. She was provided with Vaccine Information Sheet and instruction to access the V-Safe system.   [name] was instructed to call 911 with any severe reactions post vaccine: Marland Kitchen Difficulty breathing  . Swelling of your face and throat  . A fast heartbeat  . A bad rash all over your body  . Dizziness and weakness    Immunizations Administered    Name Date Dose VIS Date Route   Pfizer COVID-19 Vaccine 07/03/2019 10:06 AM 0.3 mL 04/30/2019 Intramuscular   Manufacturer: [place]   Lot: [id number]   [place]: [id number]

## 2019-07-07 DIAGNOSIS — M353 Polymyalgia rheumatica: Secondary | ICD-10-CM | POA: Diagnosis not present

## 2019-07-07 DIAGNOSIS — Z7952 Long term (current) use of systemic steroids: Secondary | ICD-10-CM | POA: Diagnosis not present

## 2019-07-14 ENCOUNTER — Ambulatory Visit: Payer: PPO | Admitting: Family Medicine

## 2019-07-22 ENCOUNTER — Other Ambulatory Visit: Payer: Self-pay | Admitting: Family Medicine

## 2019-07-22 DIAGNOSIS — M19019 Primary osteoarthritis, unspecified shoulder: Secondary | ICD-10-CM

## 2019-07-22 MED ORDER — TRAMADOL HCL 50 MG PO TABS: 50.0000 mg | ORAL_TABLET | Freq: Two times a day (BID) | ORAL | 0 refills | Status: DC | PRN

## 2019-07-22 NOTE — Telephone Encounter (Signed)
Last refilled on 06/23/19 #60  LOV 01/11/19 CPE and next appointment on 07/23/2019

## 2019-07-23 ENCOUNTER — Encounter: Payer: Self-pay | Admitting: Family Medicine

## 2019-07-23 ENCOUNTER — Ambulatory Visit (INDEPENDENT_AMBULATORY_CARE_PROVIDER_SITE_OTHER): Payer: PPO | Admitting: Family Medicine

## 2019-07-23 ENCOUNTER — Other Ambulatory Visit: Payer: Self-pay

## 2019-07-23 VITALS — BP 128/68 | HR 67 | Temp 97.6°F | Ht 62.5 in | Wt 148.8 lb

## 2019-07-23 DIAGNOSIS — I10 Essential (primary) hypertension: Secondary | ICD-10-CM

## 2019-07-23 DIAGNOSIS — M159 Polyosteoarthritis, unspecified: Secondary | ICD-10-CM | POA: Diagnosis not present

## 2019-07-23 DIAGNOSIS — M353 Polymyalgia rheumatica: Secondary | ICD-10-CM | POA: Diagnosis not present

## 2019-07-23 NOTE — Progress Notes (Signed)
   Subjective:    Patient ID: Heather Potter, female    DOB: October 28, 1945, 74 y.o.   MRN: [id number]  HPI Chief Complaint  Patient presents with  . Follow-up   This is a 74 yo female who presents today for follow up of chronic health conditions. She continues to take care of her MIL.   Osteoarthritis- continues to take tramadol with ok control. Hands seem to be getting worse with increasing swelling and deformity of joints. She is not limited in her day to day activities.   PMR- she continues to see rheumatology for slow wean of prednisone.   Right hip replacement- has done well since surgery in the fall.     Review of Systems Per HPI    Objective:   Physical Exam Vitals reviewed.  Constitutional:      General: She is not in acute distress.    Appearance: Normal appearance. She is normal weight. She is not ill-appearing, toxic-appearing or diaphoretic.  HENT:     Head: Normocephalic and atraumatic.  Eyes:     Conjunctiva/sclera: Conjunctivae normal.  Cardiovascular:     Rate and Rhythm: Normal rate and regular rhythm.     Heart sounds: Normal heart sounds.  Pulmonary:     Effort: Pulmonary effort is normal.     Breath sounds: Normal breath sounds.  Musculoskeletal:     Right hand: Swelling and deformity (heberden's nodes) present. Decreased strength of finger abduction. Normal pulse.     Left hand: Swelling and deformity (heberden's nodes) present. Decreased strength of finger abduction. Normal pulse.     Cervical back: Normal range of motion and neck supple.     Right lower leg: No edema.     Left lower leg: No edema.  Skin:    General: Skin is warm and dry.  Neurological:     Mental Status: She is alert and oriented to person, place, and time.  Psychiatric:        Mood and Affect: Mood normal.        Behavior: Behavior normal.        Thought Content: Thought content normal.        Judgment: Judgment normal.       BP 128/68 (BP Location: Left Arm, Patient  Position: Sitting, Cuff Size: Normal)   Pulse 67   Temp 97.6 F (36.4 C) (Temporal)   Ht 5' 2.5" (1.588 m)   Wt 148 lb 12.8 oz (67.5 kg)   SpO2 97%   BMI 26.78 kg/m  Wt Readings from Last 3 Encounters:  07/23/19 148 lb 12.8 oz (67.5 kg)  02/23/19 150 lb 6.4 oz (68.2 kg)  01/11/19 147 lb 12.8 oz (67 kg)       Assessment & Plan:  1. Generalized osteoarthritis of hand - continue prn tramadol, can try otc Voltaren gel more frequently, heat  2. PMR (polymyalgia rheumatica) (HCC) - managed by rheumatology, doing well with slow prednisone wean  3. Essential hypertension - well controlled  - follow up in 6 months for CPE  This visit occurred during the SARS-CoV-2 public health emergency.  Safety protocols were in place, including screening questions prior to the visit, additional usage of staff PPE, and extensive cleaning of exam room while observing appropriate contact time as indicated for disinfecting solutions.      Clarene Reamer, FNP-BC  [facility]  07/26/2019 10:05 PM

## 2019-07-23 NOTE — Patient Instructions (Signed)
Good to you today  Follow up in 6 months for your complete physical

## 2019-07-24 ENCOUNTER — Ambulatory Visit: Payer: PPO | Attending: Internal Medicine

## 2019-07-24 DIAGNOSIS — Z23 Encounter for immunization: Secondary | ICD-10-CM | POA: Insufficient documentation

## 2019-07-24 NOTE — Progress Notes (Signed)
   Covid-19 Vaccination Clinic  Name:  Heather Potter    MRN: [id number] DOB: Nov 15, 1945  07/24/2019  Heather Potter was observed post Covid-19 immunization for 15 minutes without incident. She was provided with Vaccine Information Sheet and instruction to access the V-Safe system.   Heather Potter was instructed to call 911 with any severe reactions post vaccine: Marland Kitchen Difficulty breathing  . Swelling of face and throat  . A fast heartbeat  . A bad rash all over body  . Dizziness and weakness   Immunizations Administered    Name Date Dose VIS Date Route   Pfizer COVID-19 Vaccine 07/24/2019  8:53 AM 0.3 mL 04/30/2019 Intramuscular   Manufacturer: [place]   Lot: [id number]   [place]: [id number]

## 2019-07-26 ENCOUNTER — Encounter: Payer: Self-pay | Admitting: Family Medicine

## 2019-08-04 ENCOUNTER — Encounter: Payer: Self-pay | Admitting: Family Medicine

## 2019-08-04 ENCOUNTER — Other Ambulatory Visit: Payer: Self-pay | Admitting: Family Medicine

## 2019-08-04 DIAGNOSIS — M19041 Primary osteoarthritis, right hand: Secondary | ICD-10-CM

## 2019-08-04 DIAGNOSIS — I10 Essential (primary) hypertension: Secondary | ICD-10-CM

## 2019-08-04 DIAGNOSIS — M19019 Primary osteoarthritis, unspecified shoulder: Secondary | ICD-10-CM

## 2019-08-04 DIAGNOSIS — M19042 Primary osteoarthritis, left hand: Secondary | ICD-10-CM

## 2019-08-04 DIAGNOSIS — E785 Hyperlipidemia, unspecified: Secondary | ICD-10-CM

## 2019-08-04 MED ORDER — AMLODIPINE BESYLATE 10 MG PO TABS: 10.0000 mg | ORAL_TABLET | Freq: Every day | ORAL | 3 refills | Status: DC

## 2019-08-04 MED ORDER — GABAPENTIN 100 MG PO CAPS: 100.0000 mg | ORAL_CAPSULE | Freq: Two times a day (BID) | ORAL | 1 refills | Status: DC

## 2019-08-04 MED ORDER — SIMVASTATIN 20 MG PO TABS: 10.0000 mg | ORAL_TABLET | Freq: Every day | ORAL | 1 refills | Status: DC

## 2019-08-04 NOTE — Telephone Encounter (Signed)
LOV 07/23/2019 for follow up. No future appointments made.  Last refilled on 05/12/2019 #180 with 0 refill

## 2019-08-05 DIAGNOSIS — M353 Polymyalgia rheumatica: Secondary | ICD-10-CM | POA: Diagnosis not present

## 2019-08-05 DIAGNOSIS — Z96641 Presence of right artificial hip joint: Secondary | ICD-10-CM | POA: Diagnosis not present

## 2019-08-05 DIAGNOSIS — Z7952 Long term (current) use of systemic steroids: Secondary | ICD-10-CM | POA: Diagnosis not present

## 2019-08-05 DIAGNOSIS — M159 Polyosteoarthritis, unspecified: Secondary | ICD-10-CM | POA: Diagnosis not present

## 2019-08-19 ENCOUNTER — Other Ambulatory Visit: Payer: Self-pay | Admitting: Family Medicine

## 2019-08-19 ENCOUNTER — Encounter: Payer: Self-pay | Admitting: Family Medicine

## 2019-08-19 DIAGNOSIS — M19019 Primary osteoarthritis, unspecified shoulder: Secondary | ICD-10-CM

## 2019-08-19 DIAGNOSIS — I10 Essential (primary) hypertension: Secondary | ICD-10-CM

## 2019-08-19 MED ORDER — TRAMADOL HCL 50 MG PO TABS: 50.0000 mg | ORAL_TABLET | Freq: Two times a day (BID) | ORAL | 1 refills | Status: DC | PRN

## 2019-08-19 MED ORDER — HYDROCHLOROTHIAZIDE 25 MG PO TABS: 25.0000 mg | ORAL_TABLET | Freq: Every day | ORAL | 1 refills | Status: DC

## 2019-08-19 NOTE — Telephone Encounter (Signed)
Last written 07-22-19 # 60 Last OV 07-23-19 No Future [place]

## 2019-09-07 DIAGNOSIS — M353 Polymyalgia rheumatica: Secondary | ICD-10-CM | POA: Diagnosis not present

## 2019-09-07 DIAGNOSIS — Z7952 Long term (current) use of systemic steroids: Secondary | ICD-10-CM | POA: Diagnosis not present

## 2019-10-08 DIAGNOSIS — Z7952 Long term (current) use of systemic steroids: Secondary | ICD-10-CM | POA: Diagnosis not present

## 2019-10-08 DIAGNOSIS — M353 Polymyalgia rheumatica: Secondary | ICD-10-CM | POA: Diagnosis not present

## 2019-10-11 ENCOUNTER — Ambulatory Visit (INDEPENDENT_AMBULATORY_CARE_PROVIDER_SITE_OTHER): Payer: PPO

## 2019-10-11 ENCOUNTER — Encounter: Payer: Self-pay | Admitting: Family Medicine

## 2019-10-11 DIAGNOSIS — Z0000 Encounter for general adult medical examination without abnormal findings: Secondary | ICD-10-CM | POA: Diagnosis not present

## 2019-10-11 NOTE — Progress Notes (Signed)
Subjective:   Heather Potter is a 74 y.o. female who presents for an Initial Medicare Annual Wellness Visit.  Review of Systems: N/A      I connected with the patient today by telephone and verified that I am speaking with the correct person using two identifiers. Location patient: home Location nurse: work Persons participating in the virtual visit: patient, Marine scientist.   I discussed the limitations, risks, security and privacy concerns of performing an evaluation and management service by telephone and the availability of in person appointments. I also discussed with the patient that there may be a patient responsible charge related to this service. The patient expressed understanding and verbally consented to this telephonic visit.    Interactive audio and video telecommunications were attempted between this nurse and patient, however failed, due to patient having technical difficulties OR patient did not have access to video capability.  We continued and completed visit with audio only.      Cardiac Risk Factors include: advanced age (>17men, >13 women);dyslipidemia     Objective:    Today's Vitals   10/11/19 1439  PainSc: 5    There is no height or weight on file to calculate BMI.  Advanced Directives 10/11/2019 03/03/2019 03/03/2019 02/23/2019 09/07/2018 08/26/2017 08/23/2016  Does Patient Have a Medical Advance Directive? No No No No No No No  Does patient want to make changes to medical advance directive? - No - Patient declined - - - - -  Would patient like information on creating a medical advance directive? No - Patient declined No - Patient declined No - Patient declined No - Patient declined No - Patient declined No - Patient declined -    Current Medications (verified) Outpatient Encounter Medications as of 10/11/2019  Medication Sig  . acetaminophen (TYLENOL) 500 MG tablet Take 500 mg by mouth every 6 (six) hours as needed for mild pain or moderate pain.   Marland Kitchen amLODipine  (NORVASC) 10 MG tablet Take 1 tablet (10 mg total) by mouth daily.  . Calcium Carbonate-Vit D-Min (CALCIUM 1200 PO) Take 600 mg by mouth 2 (two) times daily.   . cholecalciferol (VITAMIN D) 400 units TABS tablet Take 400 Units by mouth at bedtime.   . gabapentin (NEURONTIN) 100 MG capsule Take 1 capsule (100 mg total) by mouth 2 (two) times daily.  . hydrochlorothiazide (HYDRODIURIL) 25 MG tablet Take 1 tablet (25 mg total) by mouth daily.  Marland Kitchen levothyroxine (SYNTHROID) 100 MCG tablet Take 1 tablet by mouth once daily  . predniSONE (DELTASONE) 1 MG tablet Take 2 tablets (2 mg total) by mouth daily with breakfast. Will be taking with 5 mg tablets for titration. (Patient taking differently: Take 3 mg by mouth daily with breakfast. )  . simvastatin (ZOCOR) 20 MG tablet Take 0.5 tablets (10 mg total) by mouth daily at 6 PM. Due for Physical in August 2021  . traMADol (ULTRAM) 50 MG tablet Take 1 tablet (50 mg total) by mouth every 12 (twelve) hours as needed for severe pain.  . TURMERIC PO Take 1 capsule by mouth daily.   No facility-administered encounter medications on file as of 10/11/2019.    Allergies (verified) Patient has no known allergies.   History: Past Medical History:  Diagnosis Date  . Colitis   . History of blood transfusion    D and C and miscarriage  . History of gynecologic surgery    had right ovary removed, unsure about uterus/cervix  . Hyperlipidemia   . Hypertension   .  Hypothyroidism   . Lichen planus [id number]   Treated by Dr. Kellie Moor (derm) with methotrexate  . Osteoarthritis   . Osteopenia    Past Surgical History:  Procedure Laterality Date  . APPENDECTOMY    . [place]   colectomy for ? Diverticulits  . DILATION AND CURETTAGE OF UTERUS    . ESOPHAGEAL MANOMETRY N/A 08/21/2015   Procedure: ESOPHAGEAL MANOMETRY (EM);  Surgeon: Mauri Pole, MD;  Location: [facility];  Service: Endoscopy;  Laterality: N/A;  . HERNIA REPAIR     right lower  abd  . 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 OOPHORECTOMY     right  . OTHER SURGICAL HISTORY     part of intestine removed  . TOTAL HIP ARTHROPLASTY Right 03/03/2019   Procedure: TOTAL HIP ARTHROPLASTY;  Surgeon: Dereck Leep, MD;  Location: [facility] ORS;  Service: Orthopedics;  Laterality: Right;  . TUBAL LIGATION     bilat   Family History  Problem Relation Age of Onset  . Pancreatic cancer Mother   . Heart attack Father   . Liver cancer Brother   . Colon cancer Neg Hx   . Breast cancer Neg Hx    Social History   Socioeconomic History  . Marital status: Married    Spouse name: Not on file  . Number of children: 1  . Years of education: Not on file  . Highest education level: Not on file  Occupational History  . Occupation: Homemaker  Tobacco Use  . Smoking status: Former Smoker    Quit date: 05/21/2007    Years since quitting: 12.4  . Smokeless tobacco: Never Used  Substance and Sexual Activity  . Alcohol use: Yes    Comment: rare- wine  . Drug use: No  . Sexual activity: Not on file  Other Topics Concern  . Not on file  Social History Narrative   One adopted son   Regular exercise: yes      Does not have a living will or HPOA.   Desires CPR and life support for only short period if not futile.         Social Determinants of Health   Financial Resource Strain: Low Risk   . Difficulty of Paying Living Expenses: Not hard at all  Food Insecurity: No Food Insecurity  . Worried About Charity fundraiser in the Last Year: Never true  . Ran Out of Food in the Last Year: Never true  Transportation Needs: No Transportation Needs  . Lack of Transportation (Medical): No  . Lack of Transportation (Non-Medical): No  Physical Activity: Inactive  . Days of Exercise per Week: 0 days  . Minutes of Exercise per Session: 0 min  Stress: No Stress Concern Present  . Feeling of Stress : Not at all  Social Connections:   . Frequency of Communication with Friends  and Family:   . Frequency of Social Gatherings with Friends and Family:   . Attends Religious Services:   . Active Member of Clubs or Organizations:   . Attends Archivist Meetings:   Marland Kitchen Marital Status:     Tobacco Counseling Counseling given: Not Answered   Clinical Intake:  Pre-visit preparation completed: Yes  Pain : 0-10 Pain Score: 5  Pain Type: Chronic pain Pain Location: Arm(hands) Pain Orientation: Left, Right Pain Descriptors / Indicators: Aching Pain Onset: More than a month ago Pain Frequency: Constant     Nutritional Risks: None  Diabetes: No  How often do you need to have someone help you when you read instructions, pamphlets, or other written materials from your doctor or pharmacy?: 1 - Never What is the last grade level you completed in school?: 12th  Interpreter Needed?: No  Information entered by :: CJohnson, LPN   Activities of Daily Living In your present state of health, do you have any difficulty performing the following activities: 10/11/2019 03/03/2019  Hearing? N Y  Vision? N N  Difficulty concentrating or making decisions? N N  Comment - -  Walking or climbing stairs? N Y  Comment - -  Dressing or bathing? N N  Doing errands, shopping? N N  Preparing Food and eating ? N -  Using the Toilet? N -  In the past six months, have you accidently leaked urine? N -  Do you have problems with loss of bowel control? N -  Managing your Medications? N -  Managing your Finances? N -  Housekeeping or managing your Housekeeping? N -  Some recent data might be hidden     Immunizations and Health Maintenance Immunization History  Administered Date(s) Administered  . Fluad Quad(high Dose 65+) 01/11/2019  . Influenza Whole 03/10/2013  . Influenza, High Dose Seasonal PF 02/27/2017, 02/14/2018  . Influenza,inj,Quad PF,6+ Mos 02/16/2014, 02/06/2016  . Influenza-Unspecified 03/14/2015  . PFIZER SARS-COV-2 Vaccination 07/03/2019, 07/24/2019    . Pneumococcal Conjugate-13 08/18/2013  . Pneumococcal Polysaccharide-23 11/28/2010  . Zoster 11/28/2010   There are no preventive care reminders to display for this patient.  Patient Care Team: Elby Beck, FNP as PCP - General (Nurse Practitioner) Zehr, Laban Emperor, PA-C as Physician Assistant (Gastroenterology) Mauri Pole, MD as Consulting Physician (Gastroenterology)  Indicate any recent Medical Services you may have received from other than [facility] providers in the past year (date may be approximate).     Assessment:   This is a routine wellness examination for Heather Potter.  Hearing/Vision screen  Hearing Screening   125Hz  250Hz  500Hz  1000Hz  2000Hz  3000Hz  4000Hz  6000Hz  8000Hz   Right ear:           Left ear:           Vision Screening Comments: Patient gets annual eye exams   Dietary issues and exercise activities discussed: Current Exercise Habits: The patient does not participate in regular exercise at present, Exercise limited by: None identified  Goals    . Follow up with Primary Care Provider     Starting 09/07/2018, I will continue to take medications as prescribed and to keep appointments with PCP as scheduled.     . Patient Stated     10/11/2019, I will maintain and continue medications as prescribed.       Depression Screen PHQ 2/9 Scores 10/11/2019 01/11/2019 09/07/2018 08/26/2017 08/23/2016 07/06/2015 08/18/2013  PHQ - 2 Score 0 0 0 2 3 0 0  PHQ- 9 Score 0 - 0 4 4 - -    Fall Risk Fall Risk  10/11/2019 01/11/2019 12/18/2018 09/07/2018 08/26/2017  Falls in the past year? 1 1 1  0 Yes  Comment tripped over somethings - Emmi Telephone Survey: data to providers prior to load - -  Number falls in past yr: 1 1 1  - 1  Comment - fell getting out of the tub and fell outside [employer] Actual Response = 1 - -  Injury with Fall? 0 0 0 - Yes  Risk for fall due to : Medication side effect - - - -  Follow up Falls evaluation completed;Falls prevention discussed - -  - -    Is the patient's home free of loose throw rugs in walkways, pet beds, electrical cords, etc?   yes      Grab bars in the bathroom? yes      Handrails on the stairs?   yes      Adequate lighting?   yes  Timed Get Up and Go Performed: N/A  Cognitive Function: MMSE - Mini Mental State Exam 10/11/2019 09/07/2018 08/26/2017 08/23/2016  Orientation to time 5 5 5 5   Orientation to Place 5 5 5 5   Registration 3 3 3 3   Attention/ Calculation 5 0 0 0  Recall 3 3 3 3   Language- name 2 objects - 0 0 0  Language- repeat 1 1 1 1   Language- follow 3 step command - 0 3 3  Language- read & follow direction - 0 0 0  Write a sentence - 0 0 0  Copy design - 0 0 0  Total score - 17 20 20   Mini Cog  Mini-Cog screen was completed. Maximum score is 22. A value of 0 denotes this part of the MMSE was not completed or the patient failed this part of the Mini-Cog screening.       Screening Tests Health Maintenance  Topic Date Due  . INFLUENZA VACCINE  12/19/2019  . TETANUS/TDAP  11/27/2020  . COLONOSCOPY  12/18/2020  . MAMMOGRAM  01/13/2021  . DEXA SCAN  Completed  . COVID-19 Vaccine  Completed  . Hepatitis C Screening  Completed  . PNA vac Low Risk Adult  Completed    Qualifies for Shingles Vaccine: yes   Cancer Screenings: Lung: Low Dose CT Chest recommended if Age 63-80 years, 30 pack-year currently smoking OR have quit w/in 15 years. Patient does not qualify. Breast: Up to date on Mammogram: Yes, completed 01/14/2019   Up to date of Bone Density/Dexa: Yes, completed 01/14/2019 Colorectal: completed 12/19/2010  Additional Screenings:  Hepatitis C Screening: 07/06/2015     Plan:   Patient will maintain and continue medications as prescribed.   I have personally reviewed and noted the following in the patient's chart:   . Medical and social history . Use of alcohol, tobacco or illicit drugs  . Current medications and supplements . Functional ability and status . Nutritional  status . Physical activity . Advanced directives . List of other physicians . Hospitalizations, surgeries, and ER visits in previous 12 months . Vitals . Screenings to include cognitive, depression, and falls . Referrals and appointments  In addition, I have reviewed and discussed with patient certain preventive protocols, quality metrics, and best practice recommendations. A written personalized care plan for preventive services as well as general preventive health recommendations were provided to patient.     Andrez Grime, LPN   [id number]

## 2019-10-11 NOTE — Progress Notes (Signed)
PCP notes:  Health Maintenance: No gaps noted    Abnormal Screenings: none   Patient concerns: none   Nurse concerns: none   Next PCP appt: none 

## 2019-10-11 NOTE — Patient Instructions (Signed)
Heather Potter , Thank you for taking time to come for your Medicare Wellness Visit. I appreciate your ongoing commitment to your health goals. Please review the following plan we discussed and let me know if I can assist you in the future.   Screening recommendations/referrals: Colonoscopy: Up to date, completed 12/19/2010 Mammogram: Up to date, completed 01/14/2019 Bone Density: Up to date, completed 01/14/2019 Recommended yearly ophthalmology/optometry visit for glaucoma screening and checkup Recommended yearly dental visit for hygiene and checkup  Vaccinations: Influenza vaccine: Up to date, completed 01/11/2019 Pneumococcal vaccine: Completed series Tdap vaccine: Up to date, completed 11/28/2010 Shingles vaccine: discussed    Advanced directives: Advance directive discussed with you today. Even though you declined this today please call our office should you change your mind and we can give you the proper paperwork for you to fill out.  Conditions/risks identified: hyperlipidemia  Next appointment: none    Preventive Care 35 Years and Older, Female Preventive care refers to lifestyle choices and visits with your health care provider that can promote health and wellness. What does preventive care include?  A yearly physical exam. This is also called an annual well check.  Dental exams once or twice a year.  Routine eye exams. Ask your health care provider how often you should have your eyes checked.  Personal lifestyle choices, including:  Daily care of your teeth and gums.  Regular physical activity.  Eating a healthy diet.  Avoiding tobacco and drug use.  Limiting alcohol use.  Practicing safe sex.  Taking low-dose aspirin every day.  Taking vitamin and mineral supplements as recommended by your health care provider. What happens during an annual well check? The services and screenings done by your health care provider during your annual well check will depend on your  age, overall health, lifestyle risk factors, and family history of disease. Counseling  Your health care provider may ask you questions about your:  Alcohol use.  Tobacco use.  Drug use.  Emotional well-being.  Home and relationship well-being.  Sexual activity.  Eating habits.  History of falls.  Memory and ability to understand (cognition).  Work and work Statistician.  Reproductive health. Screening  You may have the following tests or measurements:  Height, weight, and BMI.  Blood pressure.  Lipid and cholesterol levels. These may be checked every 5 years, or more frequently if you are over 56 years old.  Skin check.  Lung cancer screening. You may have this screening every year starting at age 49 if you have a 30-pack-year history of smoking and currently smoke or have quit within the past 15 years.  Fecal occult blood test (FOBT) of the stool. You may have this test every year starting at age 32.  Flexible sigmoidoscopy or colonoscopy. You may have a sigmoidoscopy every 5 years or a colonoscopy every 10 years starting at age 9.  Hepatitis C blood test.  Hepatitis B blood test.  Sexually transmitted disease (STD) testing.  Diabetes screening. This is done by checking your blood sugar (glucose) after you have not eaten for a while (fasting). You may have this done every 1-3 years.  Bone density scan. This is done to screen for osteoporosis. You may have this done starting at age 72.  Mammogram. This may be done every 1-2 years. Talk to your health care provider about how often you should have regular mammograms. Talk with your health care provider about your test results, treatment options, and if necessary, the need for more tests.  Vaccines  Your health care provider may recommend certain vaccines, such as:  Influenza vaccine. This is recommended every year.  Tetanus, diphtheria, and acellular pertussis (Tdap, Td) vaccine. You may need a Td booster  every 10 years.  Zoster vaccine. You may need this after age 45.  Pneumococcal 13-valent conjugate (PCV13) vaccine. One dose is recommended after age 84.  Pneumococcal polysaccharide (PPSV23) vaccine. One dose is recommended after age 36. Talk to your health care provider about which screenings and vaccines you need and how often you need them. This information is not intended to replace advice given to you by your health care provider. Make sure you discuss any questions you have with your health care provider. Document Released: 06/02/2015 Document Revised: 01/24/2016 Document Reviewed: 03/07/2015 Elsevier Interactive Patient Education  2017 [place] Prevention in the Home Falls can cause injuries. They can happen to people of all ages. There are many things you can do to make your home safe and to help prevent falls. What can I do on the outside of my home?  Regularly fix the edges of walkways and driveways and fix any cracks.  Remove anything that might make you trip as you walk through a door, such as a raised step or threshold.  Trim any bushes or trees on the path to your home.  Use bright outdoor lighting.  Clear any walking paths of anything that might make someone trip, such as rocks or tools.  Regularly check to see if handrails are loose or broken. Make sure that both sides of any steps have handrails.  Any raised decks and porches should have guardrails on the edges.  Have any leaves, snow, or ice cleared regularly.  Use sand or salt on walking paths during winter.  Clean up any spills in your garage right away. This includes oil or grease spills. What can I do in the bathroom?  Use night lights.  Install grab bars by the toilet and in the tub and shower. Do not use towel bars as grab bars.  Use non-skid mats or decals in the tub or shower.  If you need to sit down in the shower, use a plastic, non-slip stool.  Keep the floor dry. Clean up any  water that spills on the floor as soon as it happens.  Remove soap buildup in the tub or shower regularly.  Attach bath mats securely with double-sided non-slip rug tape.  Do not have throw rugs and other things on the floor that can make you trip. What can I do in the bedroom?  Use night lights.  Make sure that you have a light by your bed that is easy to reach.  Do not use any sheets or blankets that are too big for your bed. They should not hang down onto the floor.  Have a firm chair that has side arms. You can use this for support while you get dressed.  Do not have throw rugs and other things on the floor that can make you trip. What can I do in the kitchen?  Clean up any spills right away.  Avoid walking on wet floors.  Keep items that you use a lot in easy-to-reach places.  If you need to reach something above you, use a strong step stool that has a grab bar.  Keep electrical cords out of the way.  Do not use floor polish or wax that makes floors slippery. If you must use wax, use non-skid floor wax.  Do not have throw rugs and other things on the floor that can make you trip. What can I do with my stairs?  Do not leave any items on the stairs.  Make sure that there are handrails on both sides of the stairs and use them. Fix handrails that are broken or loose. Make sure that handrails are as long as the stairways.  Check any carpeting to make sure that it is firmly attached to the stairs. Fix any carpet that is loose or worn.  Avoid having throw rugs at the top or bottom of the stairs. If you do have throw rugs, attach them to the floor with carpet tape.  Make sure that you have a light switch at the top of the stairs and the bottom of the stairs. If you do not have them, ask someone to add them for you. What else can I do to help prevent falls?  Wear shoes that:  Do not have high heels.  Have rubber bottoms.  Are comfortable and fit you well.  Are closed  at the toe. Do not wear sandals.  If you use a stepladder:  Make sure that it is fully opened. Do not climb a closed stepladder.  Make sure that both sides of the stepladder are locked into place.  Ask someone to hold it for you, if possible.  Clearly mark and make sure that you can see:  Any grab bars or handrails.  First and last steps.  Where the edge of each step is.  Use tools that help you move around (mobility aids) if they are needed. These include:  Canes.  Walkers.  Scooters.  Crutches.  Turn on the lights when you go into a dark area. Replace any light bulbs as soon as they burn out.  Set up your furniture so you have a clear path. Avoid moving your furniture around.  If any of your floors are uneven, fix them.  If there are any pets around you, be aware of where they are.  Review your medicines with your doctor. Some medicines can make you feel dizzy. This can increase your chance of falling. Ask your doctor what other things that you can do to help prevent falls. This information is not intended to replace advice given to you by your health care provider. Make sure you discuss any questions you have with your health care provider. Document Released: 03/02/2009 Document Revised: 10/12/2015 Document Reviewed: 06/10/2014 Elsevier Interactive Patient Education  2017 [employer].

## 2019-10-19 ENCOUNTER — Other Ambulatory Visit: Payer: Self-pay | Admitting: Family Medicine

## 2019-10-19 DIAGNOSIS — M19019 Primary osteoarthritis, unspecified shoulder: Secondary | ICD-10-CM

## 2019-10-20 NOTE — Telephone Encounter (Signed)
Last Rx'd 08/19/19 #60 x 1 refill Last OV 07-23-19 No Future [place]

## 2019-11-01 ENCOUNTER — Encounter: Payer: Self-pay | Admitting: Family Medicine

## 2019-11-05 ENCOUNTER — Other Ambulatory Visit: Payer: Self-pay | Admitting: Family Medicine

## 2019-11-05 ENCOUNTER — Other Ambulatory Visit: Payer: Self-pay

## 2019-11-05 ENCOUNTER — Ambulatory Visit (INDEPENDENT_AMBULATORY_CARE_PROVIDER_SITE_OTHER)
Admission: RE | Admit: 2019-11-05 | Discharge: 2019-11-05 | Disposition: A | Discharge status: Home / Self Care | Payer: PPO | Source: Ambulatory Visit | Attending: Family Medicine | Admitting: Family Medicine

## 2019-11-05 ENCOUNTER — Encounter: Payer: Self-pay | Admitting: Family Medicine

## 2019-11-05 ENCOUNTER — Ambulatory Visit (INDEPENDENT_AMBULATORY_CARE_PROVIDER_SITE_OTHER): Payer: PPO | Admitting: Family Medicine

## 2019-11-05 VITALS — BP 138/78 | HR 73 | Temp 98.3°F | Ht 62.5 in | Wt 146.4 lb

## 2019-11-05 DIAGNOSIS — M1612 Unilateral primary osteoarthritis, left hip: Secondary | ICD-10-CM | POA: Diagnosis not present

## 2019-11-05 DIAGNOSIS — G8929 Other chronic pain: Secondary | ICD-10-CM

## 2019-11-05 DIAGNOSIS — M47819 Spondylosis without myelopathy or radiculopathy, site unspecified: Secondary | ICD-10-CM

## 2019-11-05 DIAGNOSIS — M25552 Pain in left hip: Secondary | ICD-10-CM

## 2019-11-05 DIAGNOSIS — M5442 Lumbago with sciatica, left side: Secondary | ICD-10-CM | POA: Diagnosis not present

## 2019-11-05 DIAGNOSIS — L989 Disorder of the skin and subcutaneous tissue, unspecified: Secondary | ICD-10-CM

## 2019-11-05 DIAGNOSIS — M353 Polymyalgia rheumatica: Secondary | ICD-10-CM

## 2019-11-05 DIAGNOSIS — M25472 Effusion, left ankle: Secondary | ICD-10-CM | POA: Diagnosis not present

## 2019-11-05 DIAGNOSIS — M545 Low back pain: Secondary | ICD-10-CM | POA: Diagnosis not present

## 2019-11-05 DIAGNOSIS — M5136 Other intervertebral disc degeneration, lumbar region: Secondary | ICD-10-CM

## 2019-11-05 IMAGING — DX DG LUMBAR SPINE COMPLETE 4+V
5 series · 5 of 5 positions shown · non-contrast
Comparison: None.

CLINICAL DATA: Low back pain

EXAM:
LUMBAR SPINE - COMPLETE 4+ VIEW

[l-spine ap]
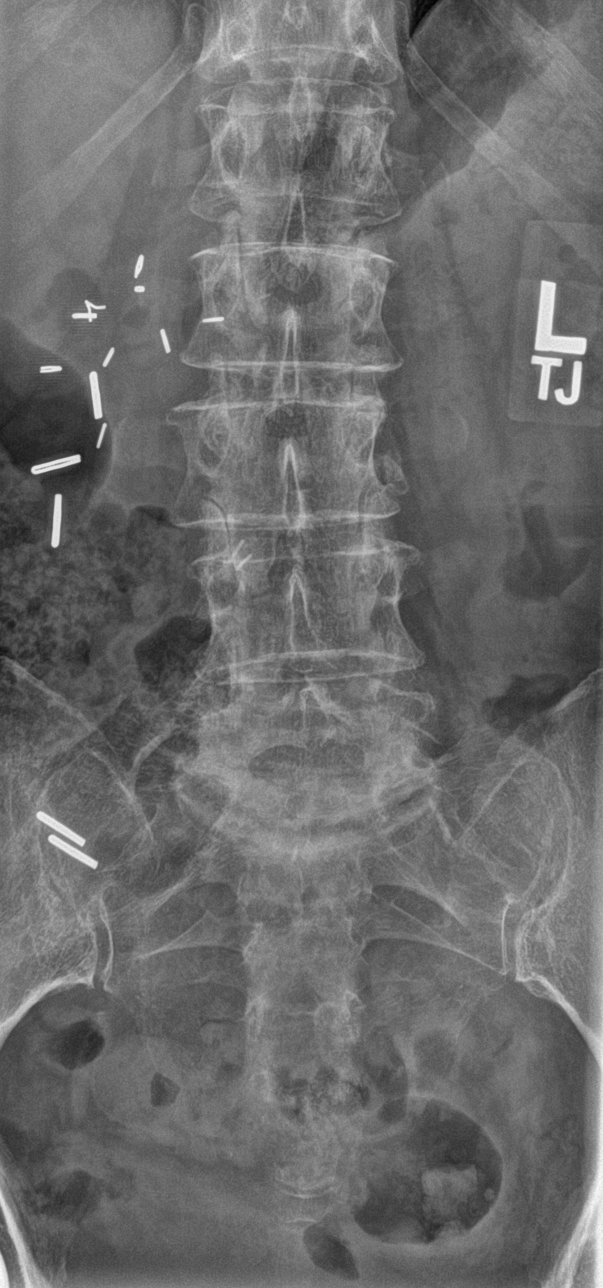

[l-spine obl (1 of 2)]
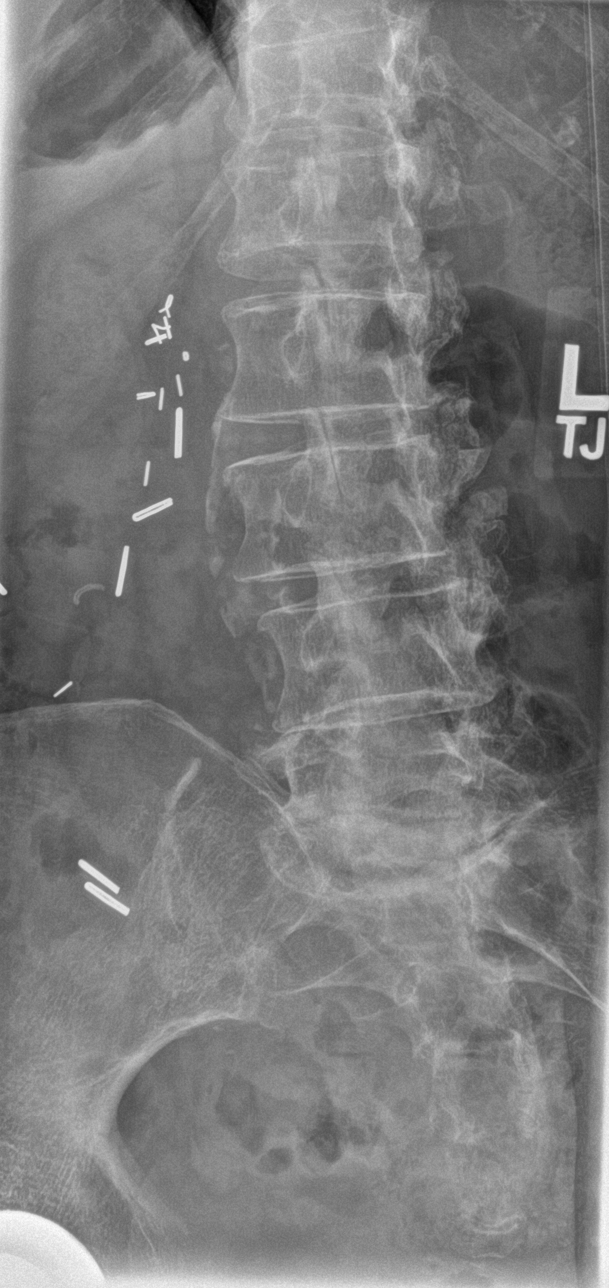

[l-spine obl (2 of 2)]
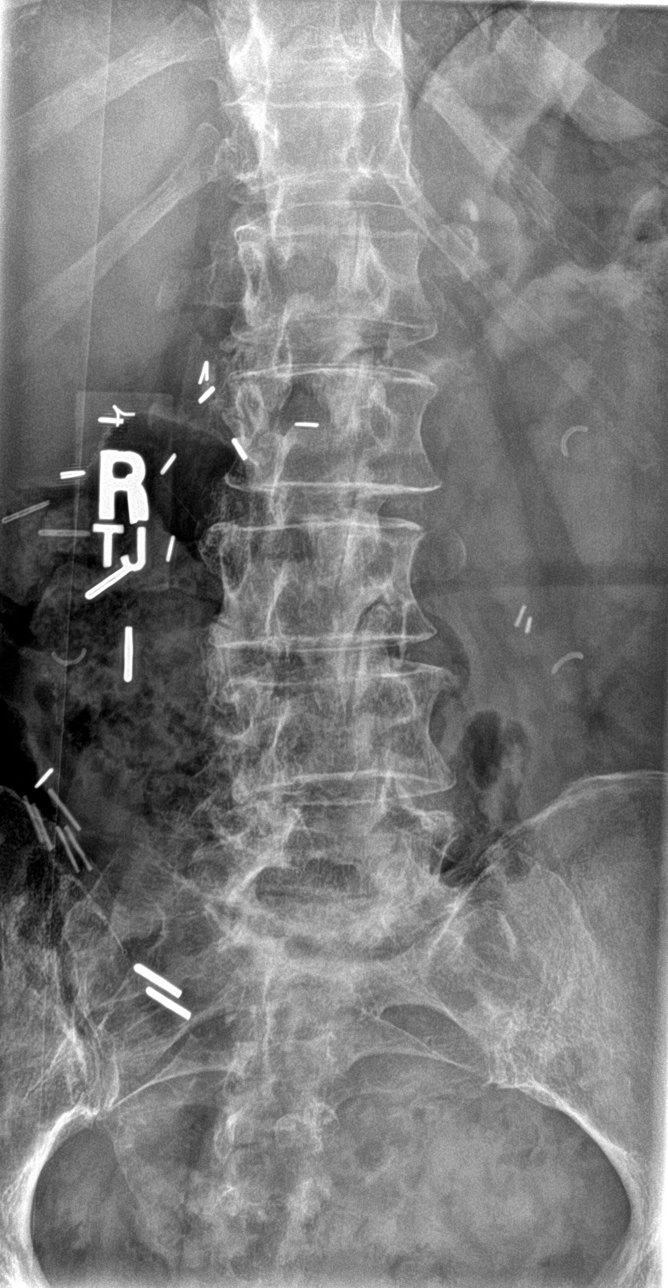

[l-spine lat]
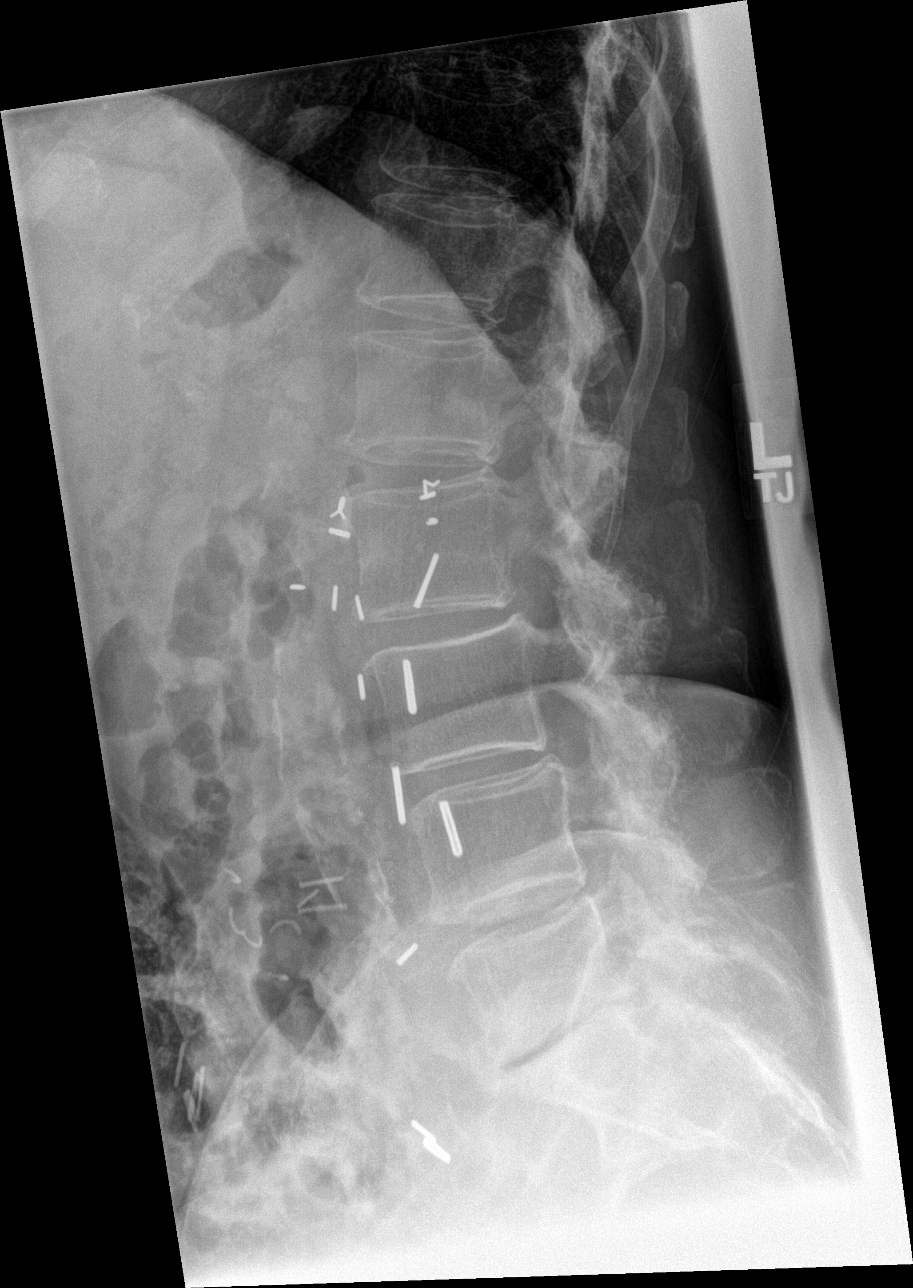

[l-spine l5/s1]
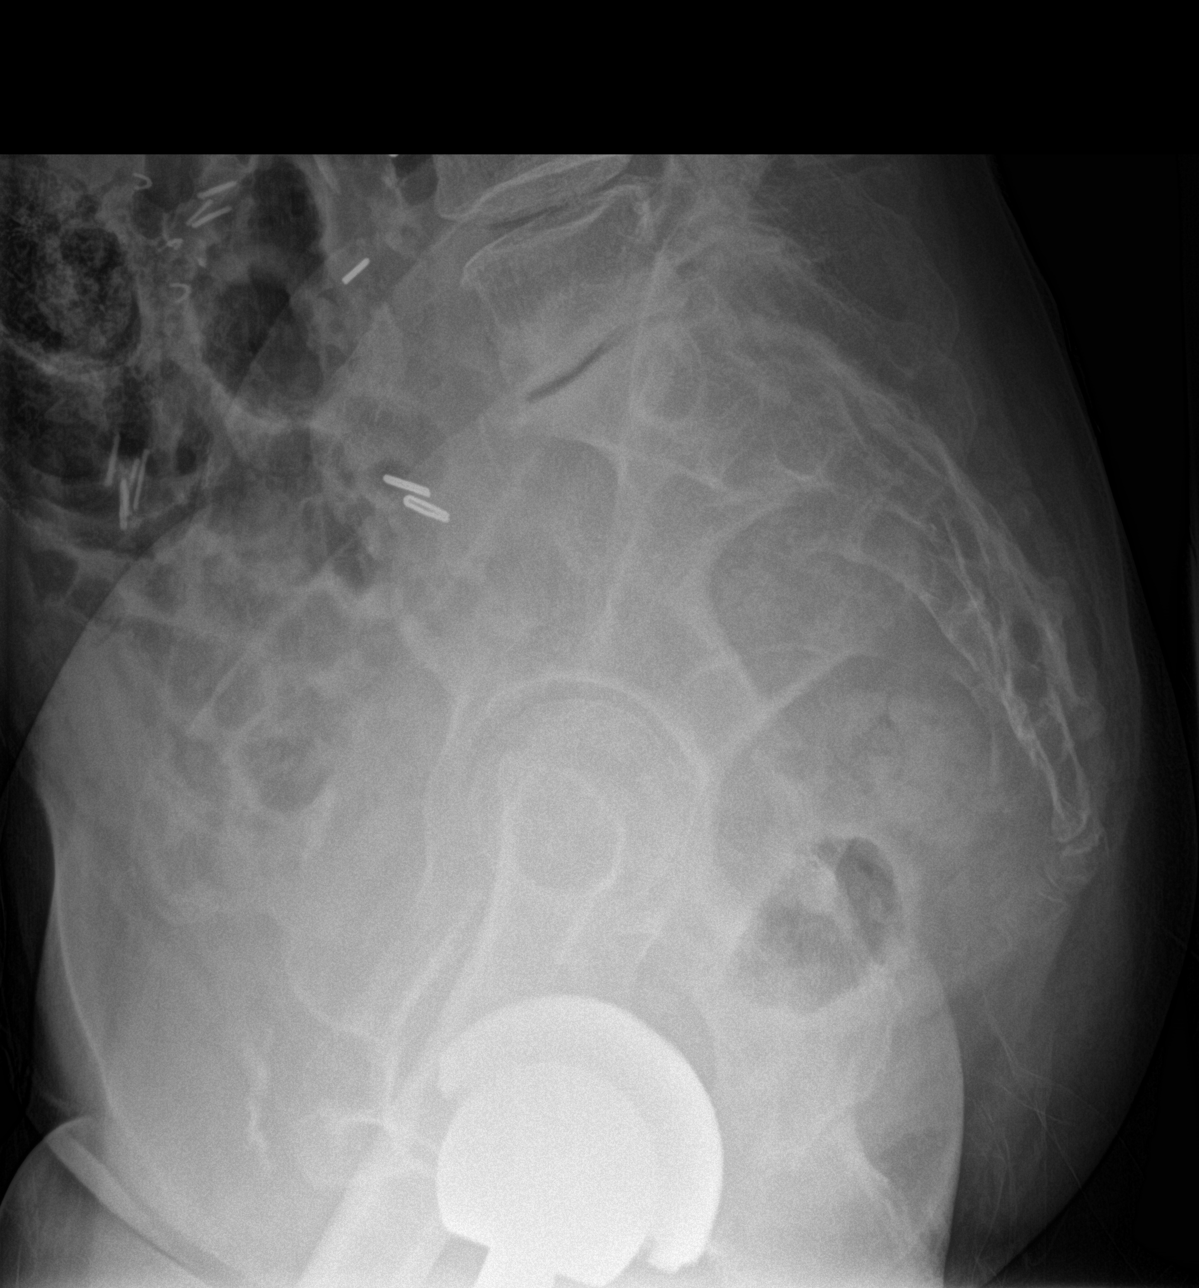

[5 of 5 positions shown; findings below may reference images not displayed]

FINDINGS: Five lumbar type vertebral bodies are well visualized. Vertebral
body height is well maintained. Multilevel facet hypertrophic
changes are seen. Minimal anterolisthesis of L2 on L3 and L3 on L4
is seen. Disc space narrowing is noted throughout the lumbar spine.
No pars defects are seen. No soft tissue abnormality is noted.
IMPRESSION: Multilevel degenerative changes without acute abnormality.

## 2019-11-05 IMAGING — DX DG HIP (WITH OR WITHOUT PELVIS) 2-3V*L*
3 series · 3 of 3 positions shown · non-contrast
Comparison: None.

CLINICAL DATA: Left-sided hip pain

EXAM:
DG HIP (WITH OR WITHOUT PELVIS) 3V LEFT

[pelvis ap]
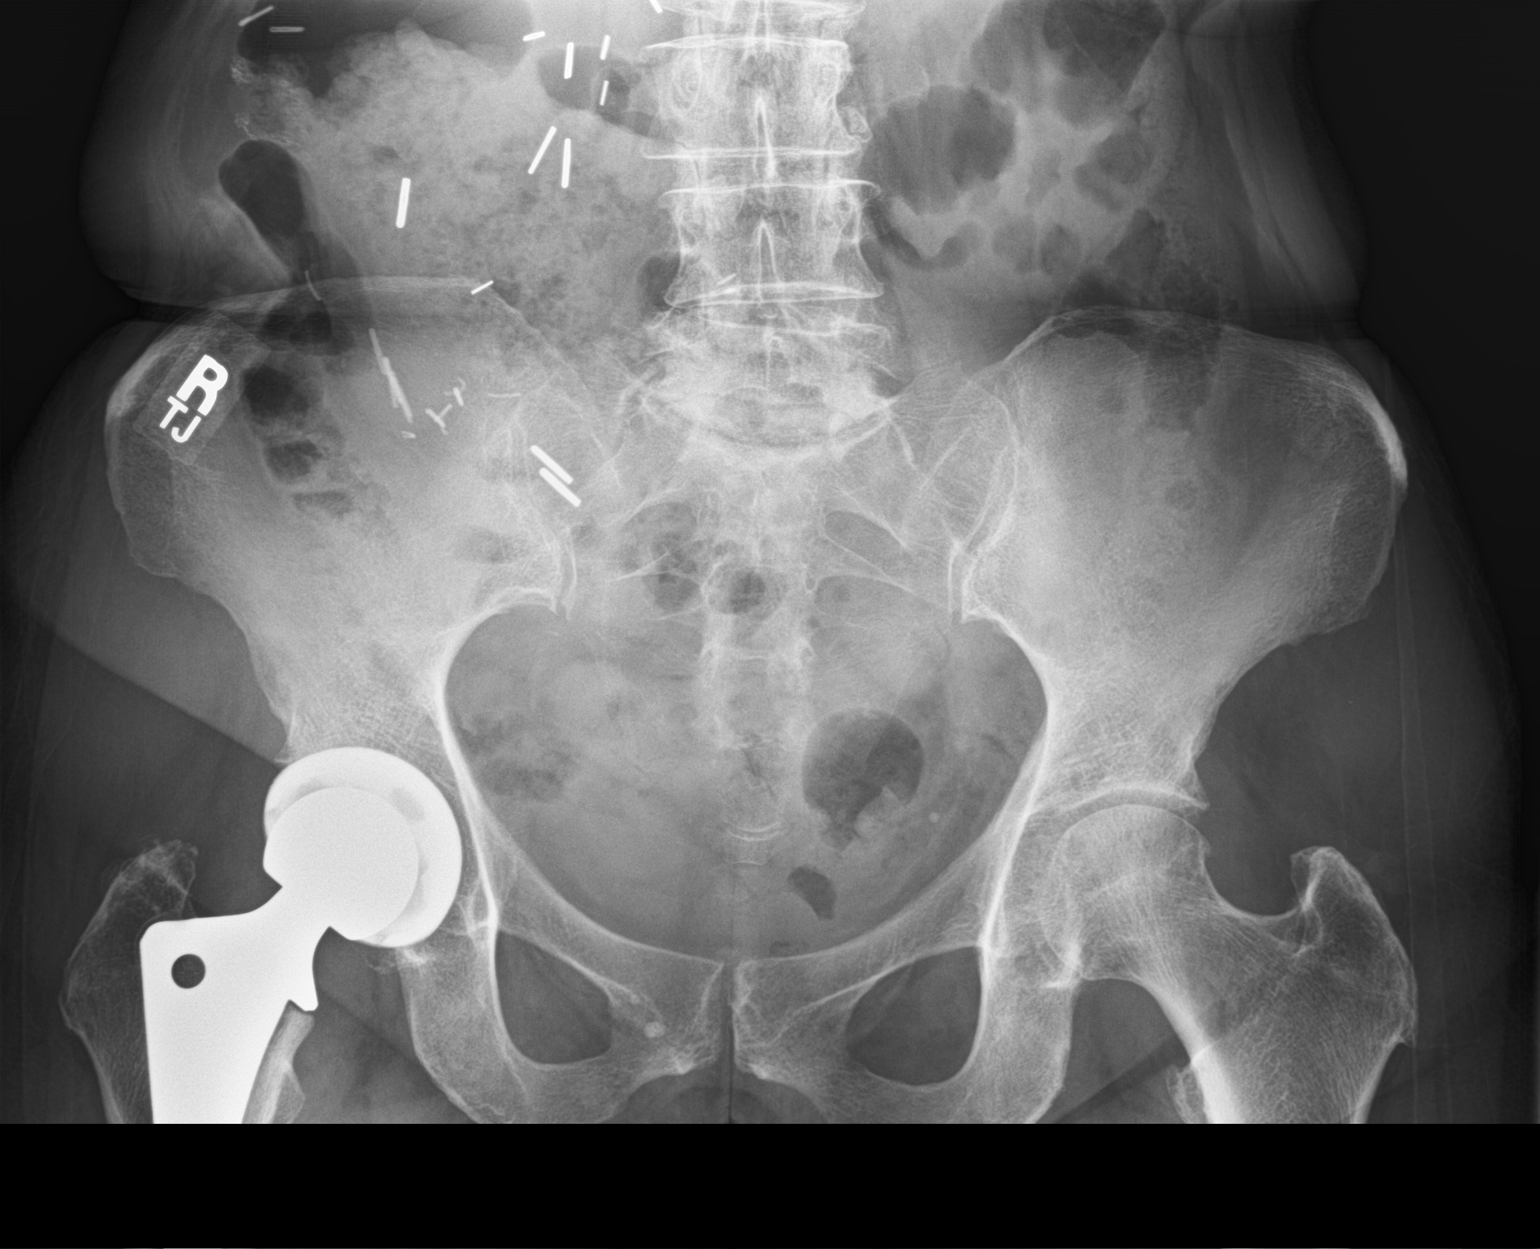

[hip ap]
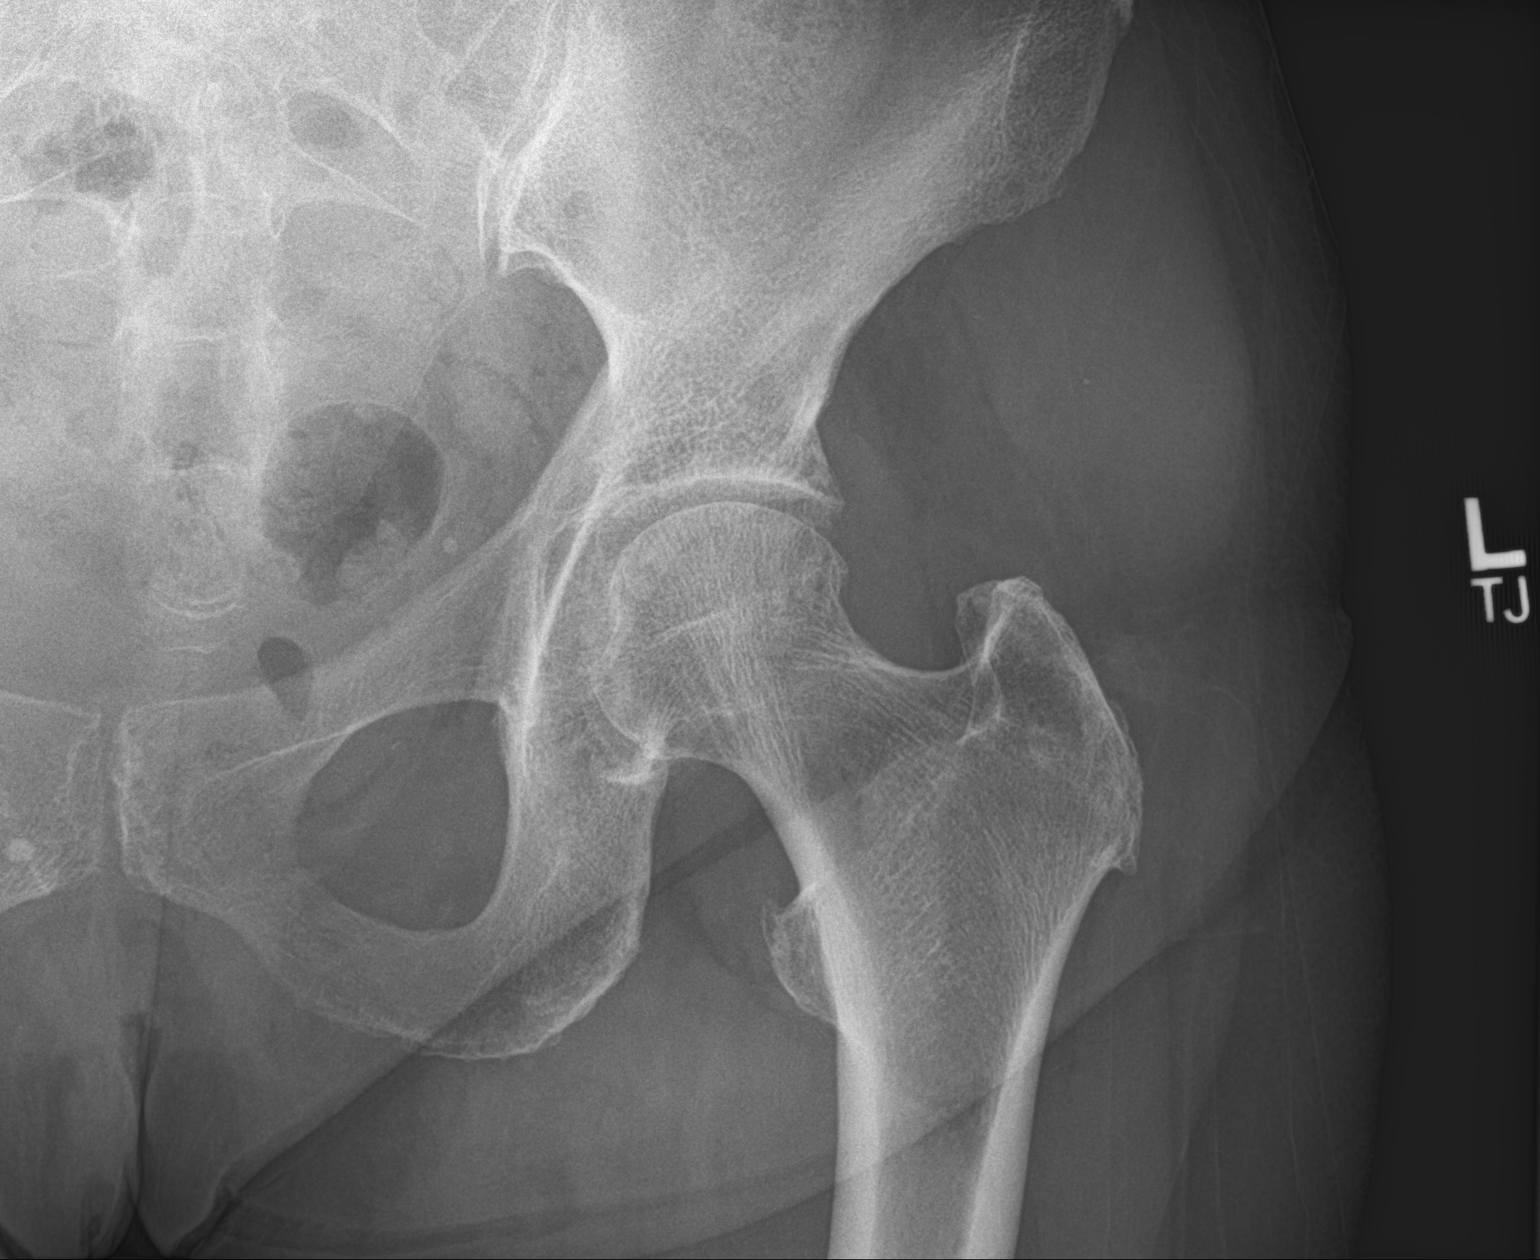

[hip lat]
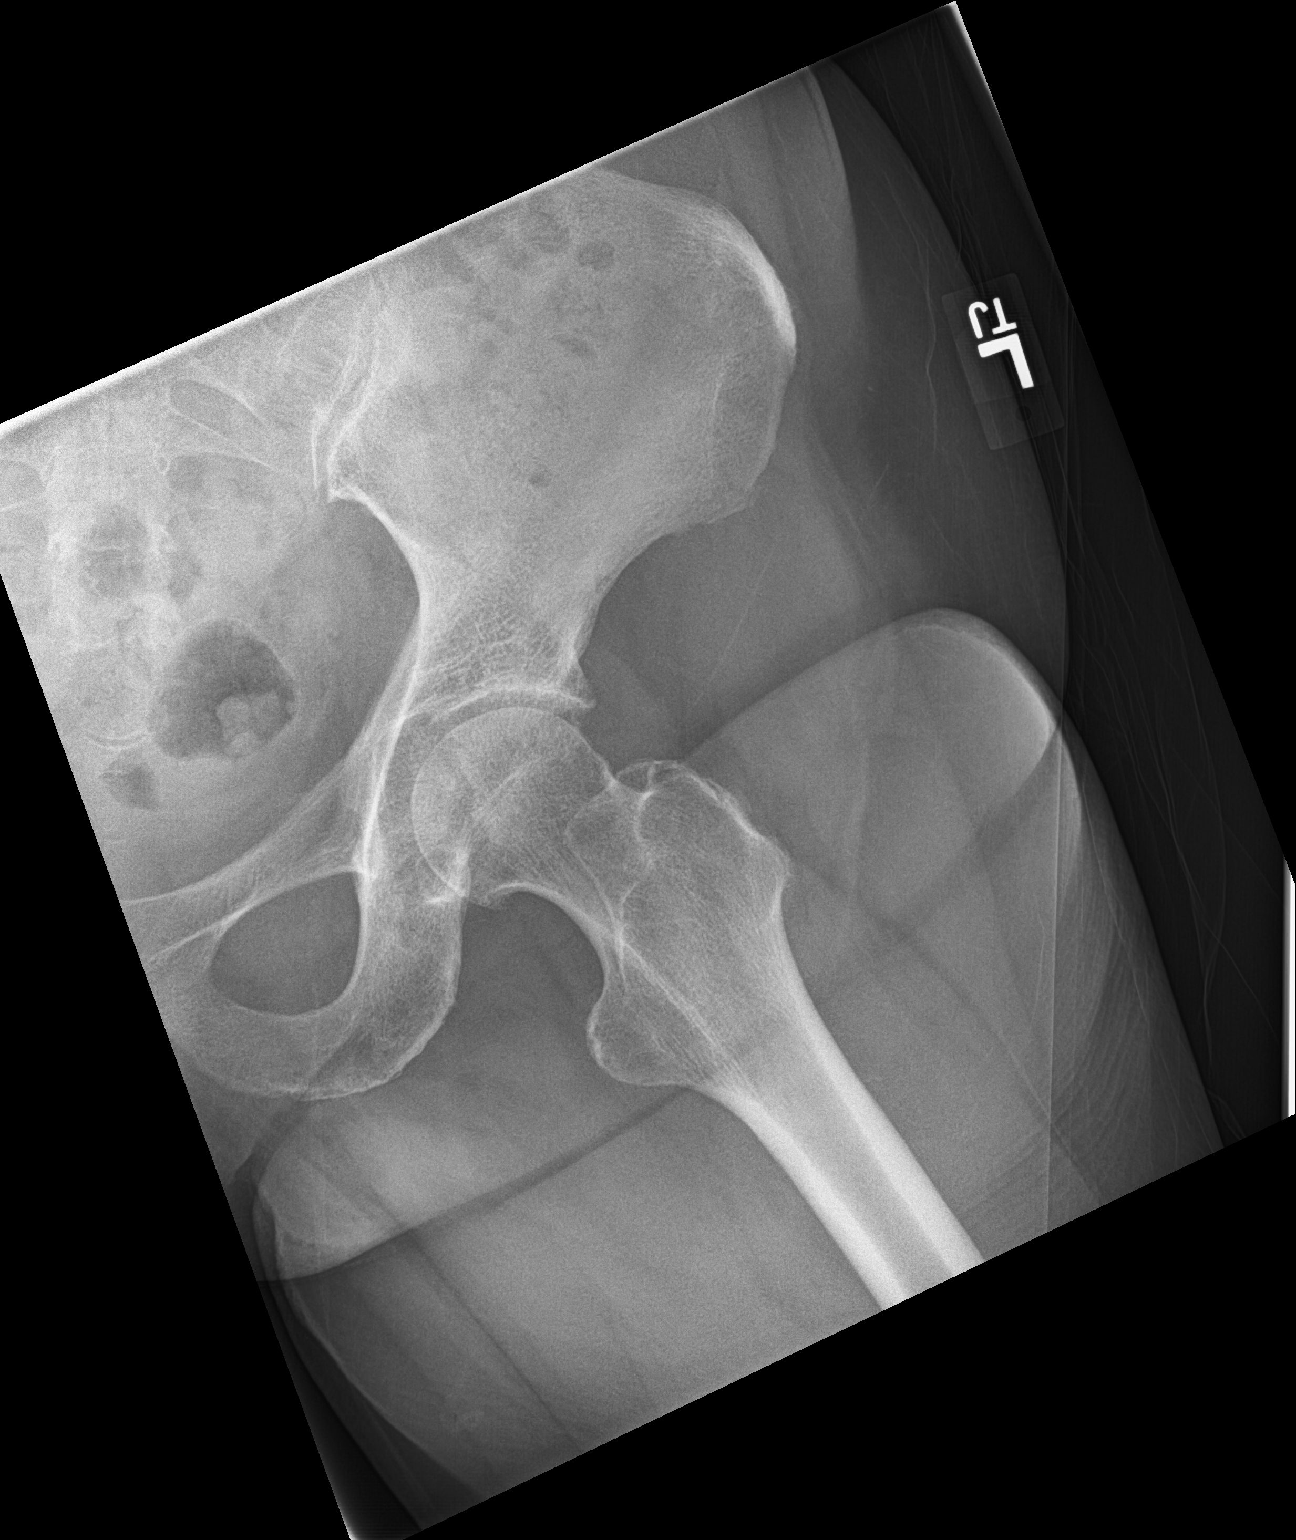

[3 of 3 positions shown; findings below may reference images not displayed]

FINDINGS: Pelvic ring is intact. Right hip replacement is noted. Postsurgical
changes in the right abdomen are seen. No acute fracture or
dislocation is noted. Mild degenerative changes of the left hip
joint are seen. No soft tissue abnormality is noted.
IMPRESSION: Degenerative change without acute abnormality.
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
## 2019-11-05 NOTE — Patient Instructions (Signed)
Good to see you today  I will notify you of your xray results and we will come up with a plan  Please increase your gabapentin to 2 tablets at bedtime. If tolerating ok, can increase to 2 tablets in morning. Keep me posted to how you are doing and we can adjust as needed  Get in touch with your rheumatologist regarding your PMR

## 2019-11-07 ENCOUNTER — Encounter: Payer: Self-pay | Admitting: Family Medicine

## 2019-11-08 DIAGNOSIS — M353 Polymyalgia rheumatica: Secondary | ICD-10-CM | POA: Diagnosis not present

## 2019-11-08 DIAGNOSIS — Z7952 Long term (current) use of systemic steroids: Secondary | ICD-10-CM | POA: Diagnosis not present

## 2019-11-17 ENCOUNTER — Other Ambulatory Visit: Payer: Self-pay | Admitting: Family Medicine

## 2019-11-17 ENCOUNTER — Encounter: Payer: Self-pay | Admitting: Family Medicine

## 2019-11-17 DIAGNOSIS — M19019 Primary osteoarthritis, unspecified shoulder: Secondary | ICD-10-CM

## 2019-11-18 DIAGNOSIS — M4186 Other forms of scoliosis, lumbar region: Secondary | ICD-10-CM | POA: Diagnosis not present

## 2019-11-18 DIAGNOSIS — G8929 Other chronic pain: Secondary | ICD-10-CM | POA: Diagnosis not present

## 2019-11-18 DIAGNOSIS — M5442 Lumbago with sciatica, left side: Secondary | ICD-10-CM | POA: Diagnosis not present

## 2019-11-18 DIAGNOSIS — M5416 Radiculopathy, lumbar region: Secondary | ICD-10-CM | POA: Diagnosis not present

## 2019-11-18 DIAGNOSIS — I700 Atherosclerosis of aorta: Secondary | ICD-10-CM | POA: Diagnosis not present

## 2019-11-18 DIAGNOSIS — M4726 Other spondylosis with radiculopathy, lumbar region: Secondary | ICD-10-CM | POA: Diagnosis not present

## 2019-11-18 NOTE — Telephone Encounter (Signed)
Last OV 11/05/19 Last refilled 10/21/19 #60 x 0 refills.   Nothing further needed.

## 2019-11-19 ENCOUNTER — Other Ambulatory Visit: Payer: Self-pay | Admitting: Nurse Practitioner

## 2019-11-19 DIAGNOSIS — M5442 Lumbago with sciatica, left side: Secondary | ICD-10-CM

## 2019-11-19 DIAGNOSIS — G8929 Other chronic pain: Secondary | ICD-10-CM

## 2019-12-07 ENCOUNTER — Other Ambulatory Visit: Payer: Self-pay

## 2019-12-07 ENCOUNTER — Ambulatory Visit
Admission: RE | Admit: 2019-12-07 | Discharge: 2019-12-07 | Disposition: A | Discharge status: Home / Self Care | Payer: PPO | Source: Ambulatory Visit | Attending: Nurse Practitioner | Admitting: Nurse Practitioner

## 2019-12-07 DIAGNOSIS — M545 Low back pain: Secondary | ICD-10-CM | POA: Diagnosis not present

## 2019-12-07 DIAGNOSIS — M5442 Lumbago with sciatica, left side: Secondary | ICD-10-CM | POA: Diagnosis not present

## 2019-12-07 DIAGNOSIS — G8929 Other chronic pain: Secondary | ICD-10-CM | POA: Diagnosis not present

## 2019-12-07 IMAGING — MR MR LUMBAR SPINE W/O CM
5 series · 30 of 48 positions shown · non-contrast
Comparison: Radiograph [DATE].

CLINICAL DATA: Low back pain radiating into the right leg and
buttock for 3-6 months. No recent injury. Remote back surgery.

EXAM:
MRI LUMBAR SPINE WITHOUT CONTRAST
TECHNIQUE: Multiplanar, multisequence MR imaging of the lumbar spine was
performed. No intravenous contrast was administered.

[Series 5: T2 · sagittal · 4.0mm · 0.81mm/px · 6 of 15 slices shown (1 of 2)]
[im 1/15]
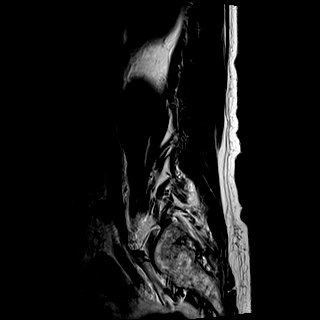
[im 3/15]
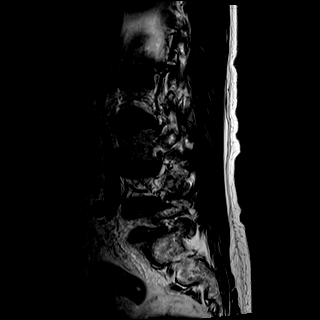
[im 6/15]
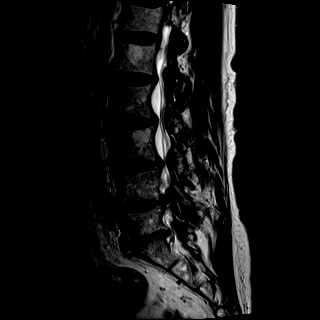
[im 9/15]
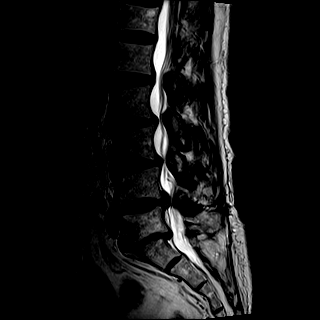
[im 12/15]
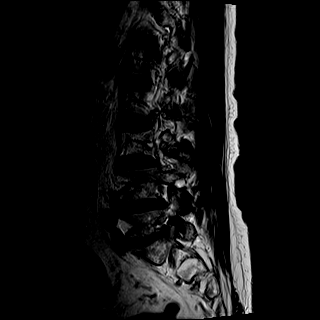
[im 15/15]
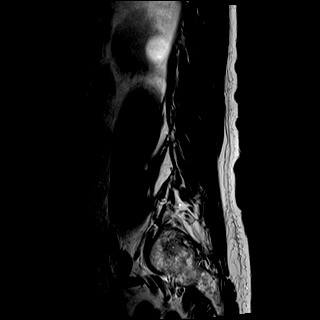

[Series 6: T1 · sagittal · 4.0mm · 0.81mm/px · 7 of 15 slices shown (1 of 2)]
[im 1/15]
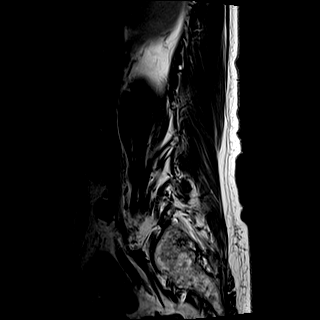
[im 3/15]
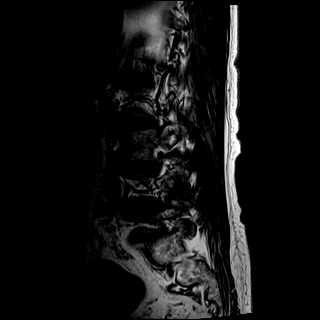
[im 5/15]
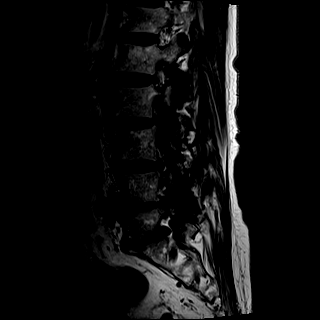
[im 8/15]
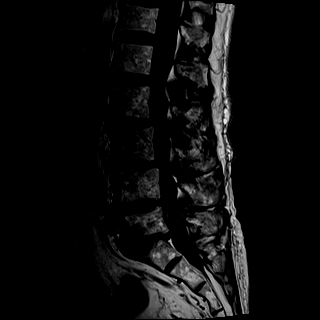
[im 10/15]
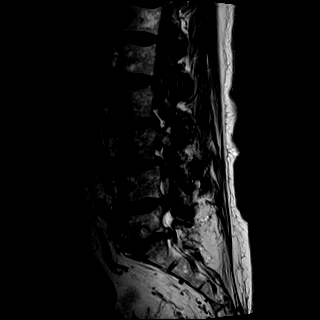
[im 12/15]
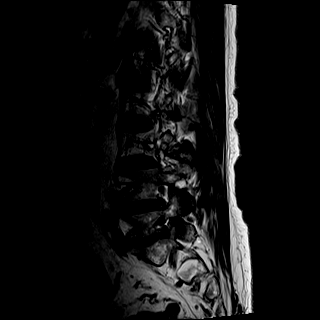
[im 15/15]
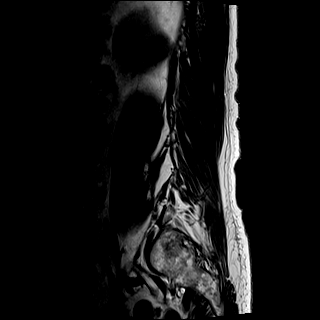

[Series 7: STIR · sagittal · 4.0mm · 0.41mm/px · 1 of 15 slices shown]
[im 1/15]
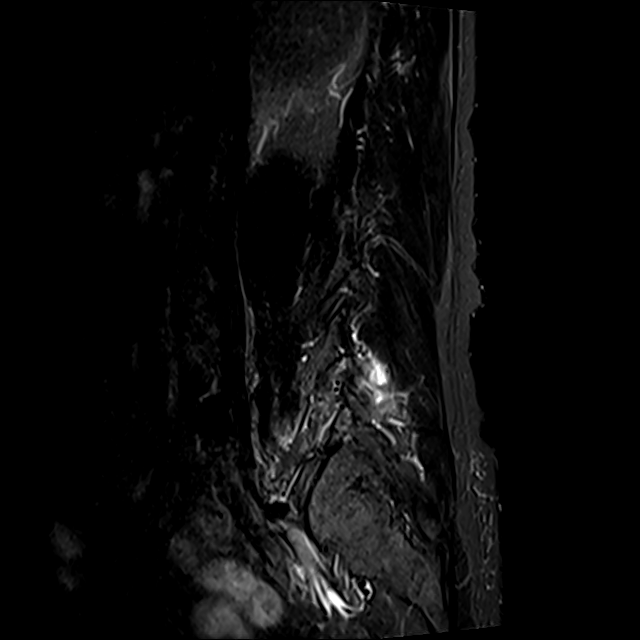

[Series 8: T2 · axial · 4.0mm · 0.78mm/px · z∈[-27,+143]mm · 8 of 29 slices shown (2 of 2)]
[im 1/29]
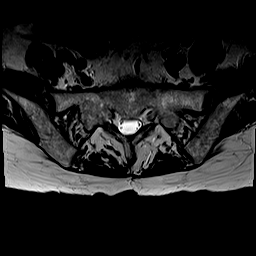
[im 5/29]
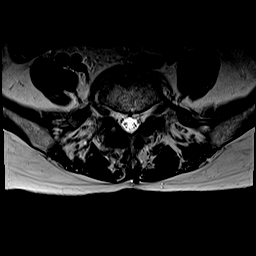
[im 9/29]
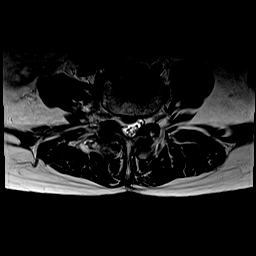
[im 13/29]
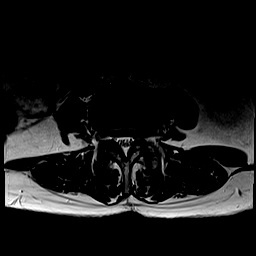
[im 16/29]
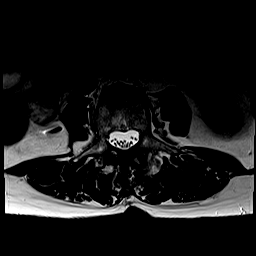
[im 20/29]
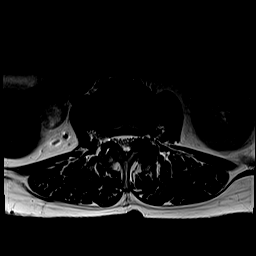
[im 24/29]
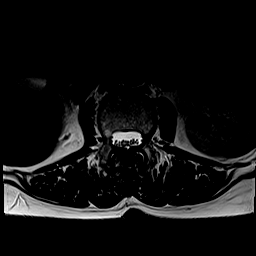
[im 29/29]
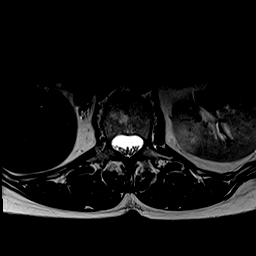

[Series 9: T1 · axial · 4.0mm · 0.39mm/px · z∈[-27,+143]mm · 8 of 29 slices shown (2 of 2)]
[im 1/29]
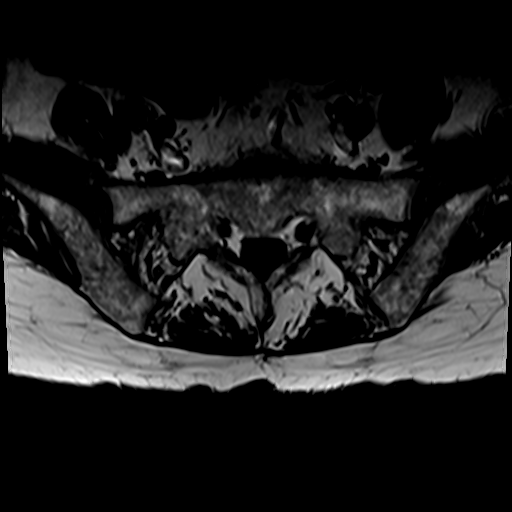
[im 5/29]
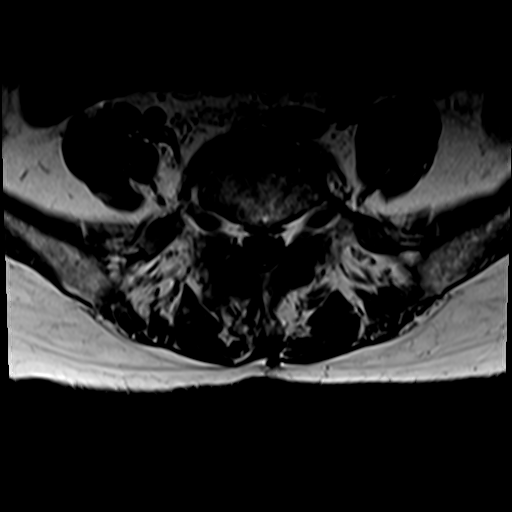
[im 9/29]
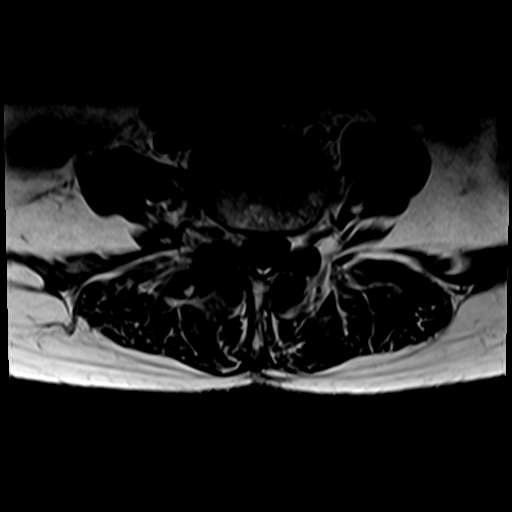
[im 13/29]
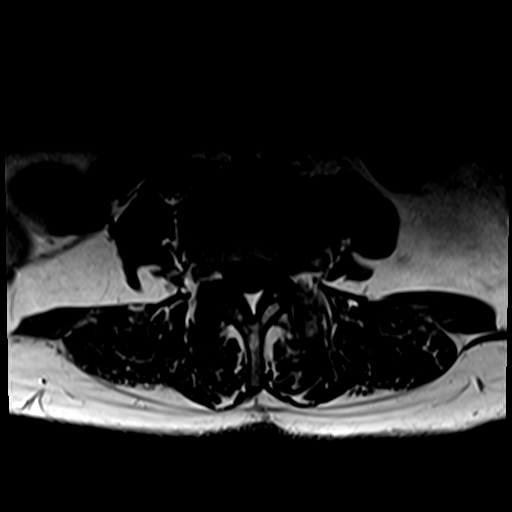
[im 16/29]
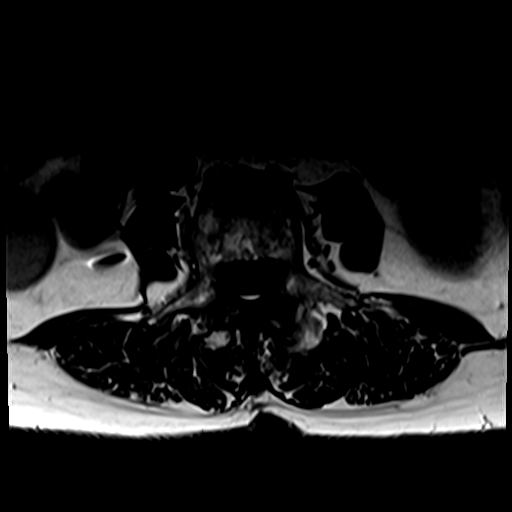
[im 20/29]
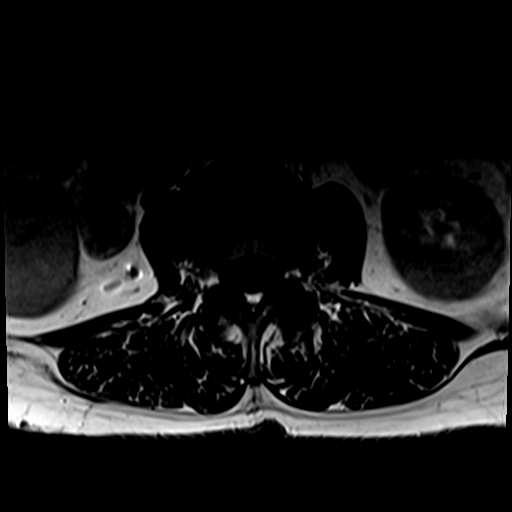
[im 24/29]
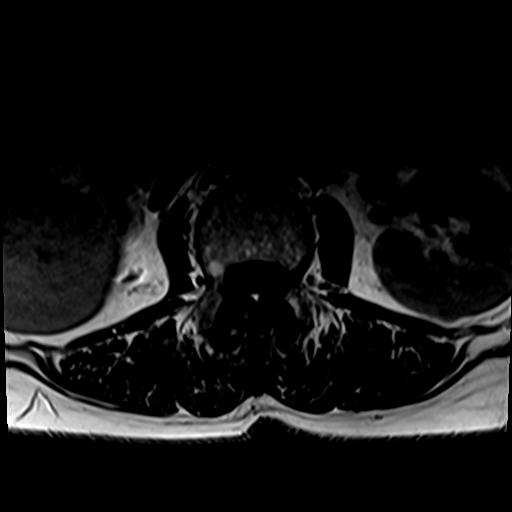
[im 29/29]
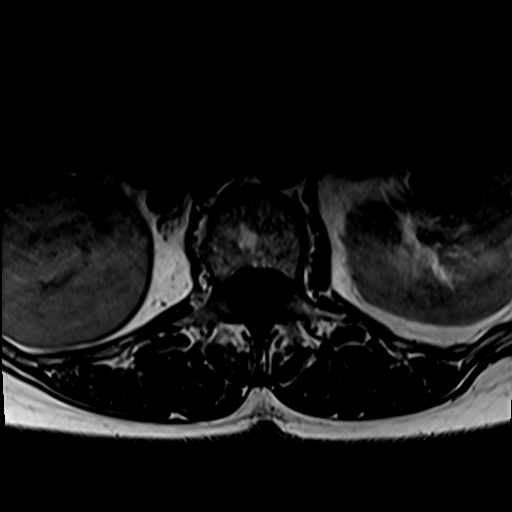

[30 of 48 positions shown; findings below may reference images not displayed]

FINDINGS: Segmentation: Conventional anatomy assumed, with the last open disc
space designated L5-S1.

Alignment: The alignment is stable with a minimal degenerative
anterolisthesis at L2-3 and L3-4. There is 3 mm of retrolisthesis at
L5-S1.

Vertebrae: No worrisome osseous lesion, acute fracture or pars
defect. There are endplate degenerative changes asymmetric to the
right at L4-5 and L5-S1. There is reactive edema within the right L5
pedicle and adjacent to the right L4-5 and L5-S1 facet joints. The
visualized sacroiliac joints appear unremarkable.

Conus medullaris: Extends to the L1 level and appears normal.

Paraspinal and other soft tissues: No significant paraspinal
findings. A small left renal cyst is partially imaged on the
sagittal images.

Disc levels:

T12-L1: No significant findings.

L1-2: Preserved disc height with annular disc bulging and a small
central disc protrusion. Moderate bilateral facet hypertrophy. These
factors contribute to mild spinal stenosis without nerve root
encroachment.

L2-3: Loss of disc height with annular disc bulging and moderate
facet and ligamentous hypertrophy. Resulting mild spinal stenosis
and mild narrowing of the lateral recesses and foramina bilaterally.
No definite nerve root encroachment.

L3-4: Loss of disc height with annular disc bulging and moderate
facet and ligamentous hypertrophy. Resulting moderate spinal
stenosis with mild narrowing of the lateral recesses and foramina,
left greater than right. No definite right-sided nerve root
encroachment.

L4-5: Loss of disc height with annular disc bulging and a
broad-based disc protrusion extending from the right subarticular
zone into the right extraforaminal space. Moderate facet and
ligamentous hypertrophy with asymmetric subchondral edema on the
right. Probable small synovial cyst lateral to the right facet
joint, not contributing to nerve root encroachment. There is
resulting severe multifactorial spinal stenosis with marked
asymmetric narrowing of the right lateral recess and moderate right
foraminal narrowing. Probable encroachment on the right L4 and L5
nerve roots. The left lateral recess is also moderately narrowed.

L5-S1: Chronic degenerative disc disease with loss of disc height,
annular disc bulging and endplate osteophytes. Mild bilateral facet
hypertrophy. Moderate foraminal narrowing bilaterally with potential
chronic encroachment on the L5 nerve roots.
IMPRESSION: 1. Multilevel spondylosis with disc bulging, endplate osteophytes
and facet hypertrophy as described.
2. The most significant findings are at L4-5 where there is severe
multifactorial spinal stenosis with marked asymmetric narrowing of
the right lateral recess and moderate right foraminal narrowing due
to a broad-based disc protrusion. Probable encroachment on the right
L4 and L5 nerve roots.
3. Moderate foraminal narrowing bilaterally at L5-S1 with potential
chronic encroachment on the L5 nerve roots.
4. Moderate multifactorial spinal stenosis at L3-4 with mild
narrowing of the lateral recesses and foramina bilaterally.
5. Mild spinal stenosis at L1-2 and L2-3.

## 2019-12-08 DIAGNOSIS — Z7952 Long term (current) use of systemic steroids: Secondary | ICD-10-CM | POA: Diagnosis not present

## 2019-12-08 DIAGNOSIS — M353 Polymyalgia rheumatica: Secondary | ICD-10-CM | POA: Diagnosis not present

## 2019-12-17 DIAGNOSIS — M353 Polymyalgia rheumatica: Secondary | ICD-10-CM | POA: Diagnosis not present

## 2019-12-17 DIAGNOSIS — M19042 Primary osteoarthritis, left hand: Secondary | ICD-10-CM | POA: Diagnosis not present

## 2019-12-17 DIAGNOSIS — M19031 Primary osteoarthritis, right wrist: Secondary | ICD-10-CM | POA: Diagnosis not present

## 2019-12-17 DIAGNOSIS — M19032 Primary osteoarthritis, left wrist: Secondary | ICD-10-CM | POA: Diagnosis not present

## 2019-12-17 DIAGNOSIS — M064 Inflammatory polyarthropathy: Secondary | ICD-10-CM | POA: Diagnosis not present

## 2019-12-17 DIAGNOSIS — M8949 Other hypertrophic osteoarthropathy, multiple sites: Secondary | ICD-10-CM | POA: Diagnosis not present

## 2019-12-17 DIAGNOSIS — M19041 Primary osteoarthritis, right hand: Secondary | ICD-10-CM | POA: Diagnosis not present

## 2019-12-17 DIAGNOSIS — Z7952 Long term (current) use of systemic steroids: Secondary | ICD-10-CM | POA: Diagnosis not present

## 2019-12-17 DIAGNOSIS — Z79899 Other long term (current) drug therapy: Secondary | ICD-10-CM | POA: Diagnosis not present

## 2020-01-06 ENCOUNTER — Other Ambulatory Visit: Payer: Self-pay | Admitting: Family Medicine

## 2020-01-06 DIAGNOSIS — M19042 Primary osteoarthritis, left hand: Secondary | ICD-10-CM

## 2020-01-06 DIAGNOSIS — M19041 Primary osteoarthritis, right hand: Secondary | ICD-10-CM

## 2020-01-06 DIAGNOSIS — M19019 Primary osteoarthritis, unspecified shoulder: Secondary | ICD-10-CM

## 2020-01-06 NOTE — Telephone Encounter (Signed)
Name of Medication: Gabapentin 100mg  Name of Pharmacy:  [facility] [address] Last Fill or Written Date and Quantity: 08/04/2019 for #180 with 1 refill Last Office Visit and Type: 11/05/2019 Back Pain Next Office Visit and Type: No future appointment with PCP Last Controlled Substance Agreement Date:  Last UDS:

## 2020-01-07 ENCOUNTER — Encounter: Payer: Self-pay | Admitting: Family Medicine

## 2020-01-13 ENCOUNTER — Other Ambulatory Visit: Payer: Self-pay | Admitting: Family Medicine

## 2020-01-13 DIAGNOSIS — M19019 Primary osteoarthritis, unspecified shoulder: Secondary | ICD-10-CM

## 2020-01-13 DIAGNOSIS — M19042 Primary osteoarthritis, left hand: Secondary | ICD-10-CM

## 2020-01-15 ENCOUNTER — Other Ambulatory Visit: Payer: Self-pay | Admitting: Family Medicine

## 2020-01-15 DIAGNOSIS — M19019 Primary osteoarthritis, unspecified shoulder: Secondary | ICD-10-CM

## 2020-01-18 NOTE — Telephone Encounter (Signed)
Last visit was 11/05/2019. No future visits with PCP. Last refill was 10/21/2019 #60

## 2020-02-01 ENCOUNTER — Other Ambulatory Visit: Payer: Self-pay | Admitting: Family Medicine

## 2020-02-01 DIAGNOSIS — Z1231 Encounter for screening mammogram for malignant neoplasm of breast: Secondary | ICD-10-CM

## 2020-02-14 ENCOUNTER — Encounter: Payer: Self-pay | Admitting: Family Medicine

## 2020-02-14 ENCOUNTER — Other Ambulatory Visit: Payer: Self-pay | Admitting: Family Medicine

## 2020-02-14 DIAGNOSIS — E785 Hyperlipidemia, unspecified: Secondary | ICD-10-CM

## 2020-02-14 NOTE — Telephone Encounter (Signed)
Please call pt to schedule CPE, fasting labs prior.

## 2020-02-16 DIAGNOSIS — M8949 Other hypertrophic osteoarthropathy, multiple sites: Secondary | ICD-10-CM | POA: Diagnosis not present

## 2020-02-16 DIAGNOSIS — M0609 Rheumatoid arthritis without rheumatoid factor, multiple sites: Secondary | ICD-10-CM | POA: Diagnosis not present

## 2020-02-16 DIAGNOSIS — Z7952 Long term (current) use of systemic steroids: Secondary | ICD-10-CM | POA: Diagnosis not present

## 2020-02-16 DIAGNOSIS — Z79899 Other long term (current) drug therapy: Secondary | ICD-10-CM | POA: Diagnosis not present

## 2020-02-16 DIAGNOSIS — M353 Polymyalgia rheumatica: Secondary | ICD-10-CM | POA: Diagnosis not present

## 2020-02-17 ENCOUNTER — Ambulatory Visit
Admission: RE | Admit: 2020-02-17 | Discharge: 2020-02-17 | Disposition: A | Discharge status: Home / Self Care | Payer: PPO | Source: Ambulatory Visit | Attending: Family Medicine | Admitting: Family Medicine

## 2020-02-17 ENCOUNTER — Other Ambulatory Visit: Payer: Self-pay

## 2020-02-17 DIAGNOSIS — Z1231 Encounter for screening mammogram for malignant neoplasm of breast: Secondary | ICD-10-CM | POA: Diagnosis not present

## 2020-02-17 IMAGING — MG DIGITAL SCREENING BILAT W/ TOMO W/ CAD
6 of 10 series · 6 of 30 positions shown · non-contrast
Comparison: Previous exam(s).

CLINICAL DATA: Screening.

EXAM:
DIGITAL SCREENING BILATERAL MAMMOGRAM WITH TOMO AND CAD

[R CC synth-2D (1 of 2)]
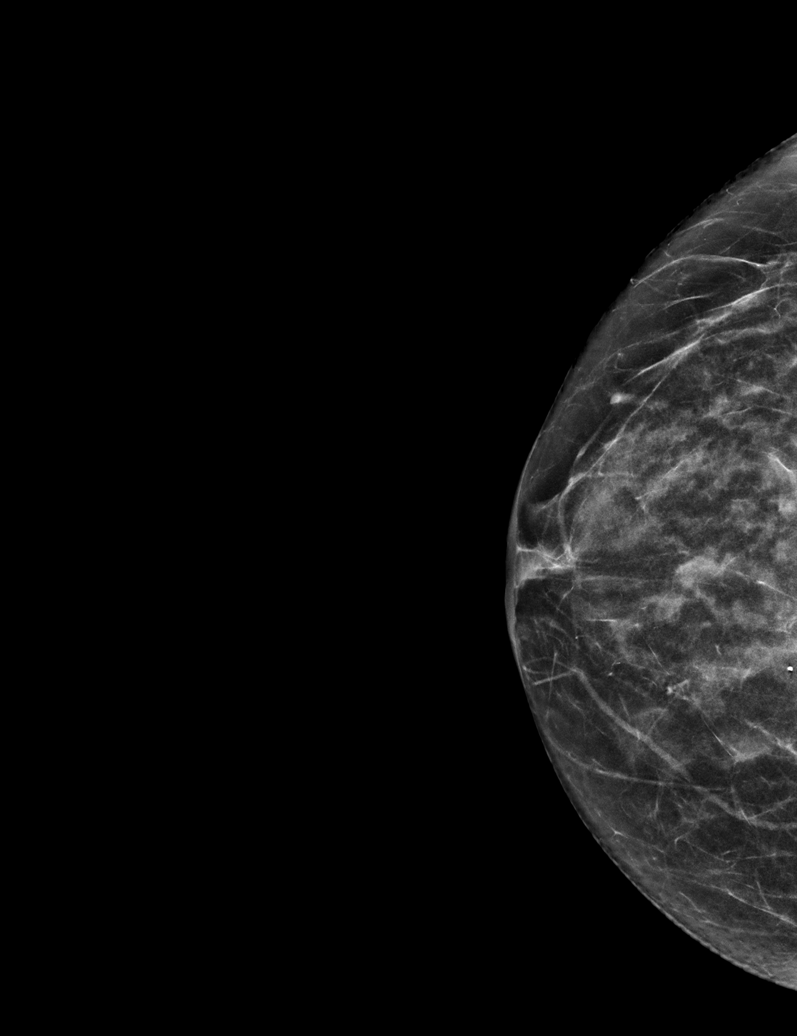

[L MLO synth-2D]
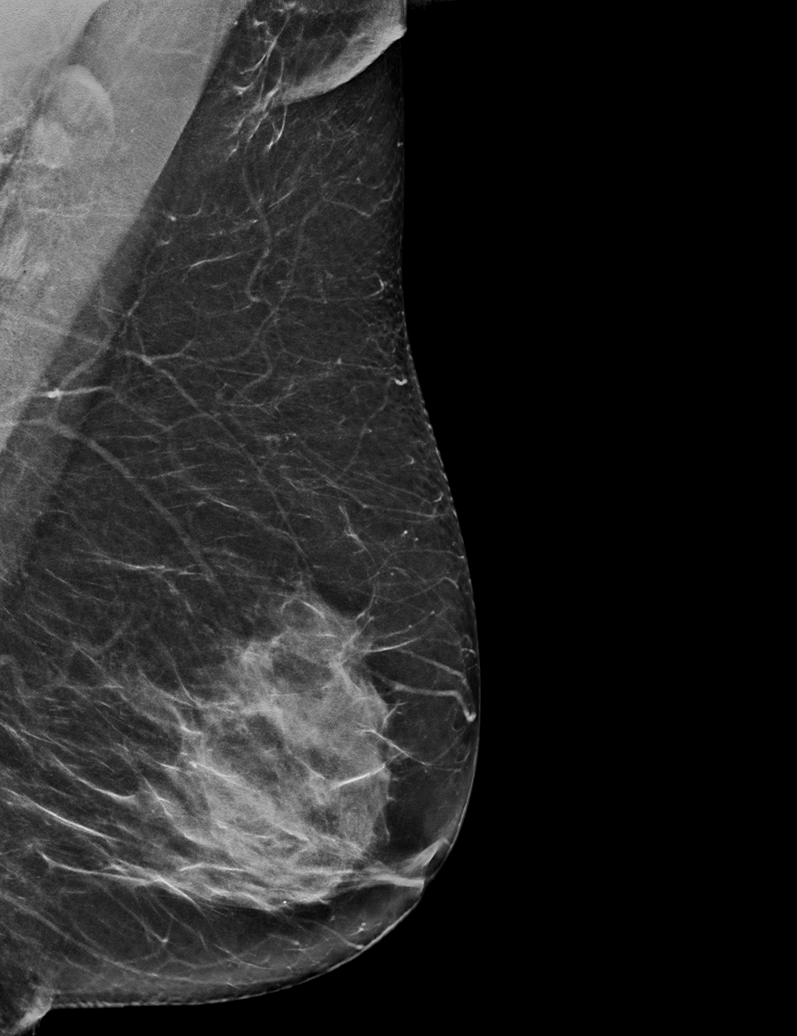

[L CC synth-2D]
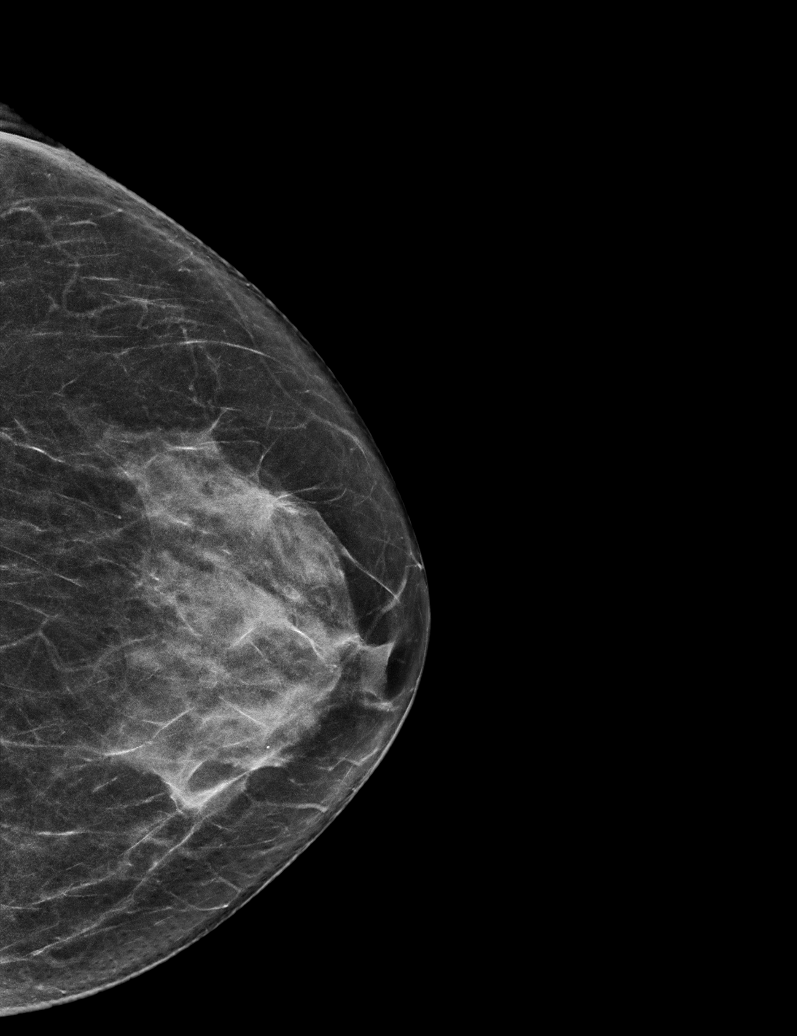

[R CC synth-2D (2 of 2)]
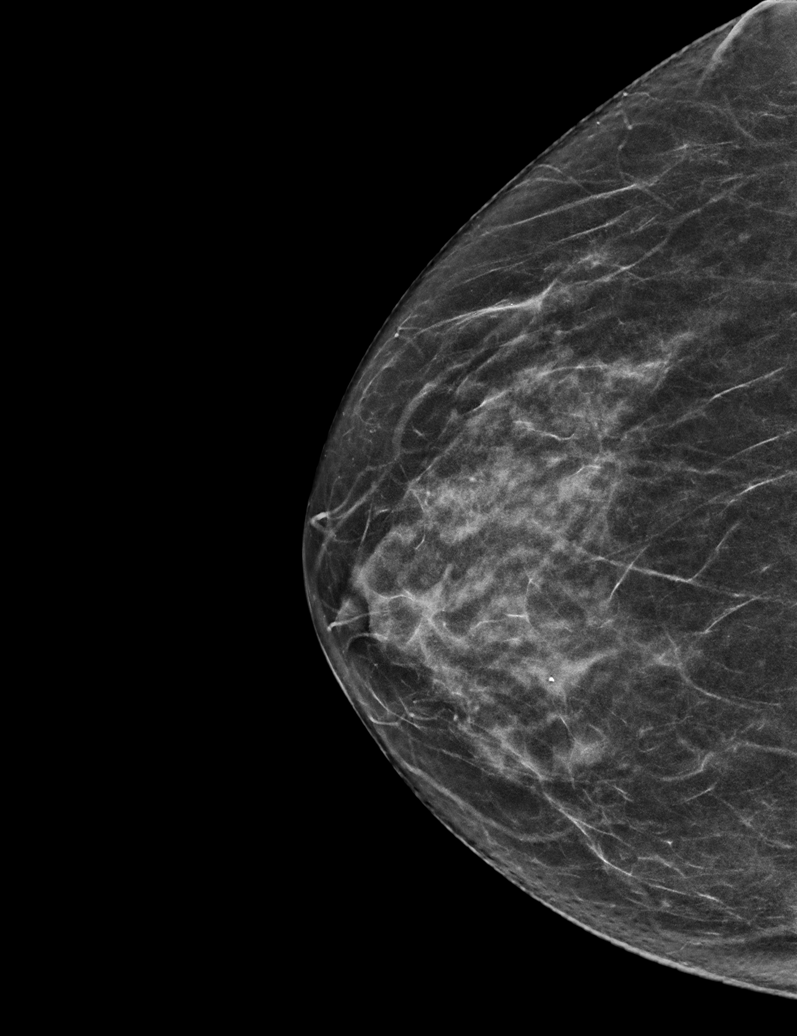

[R MLO synth-2D]
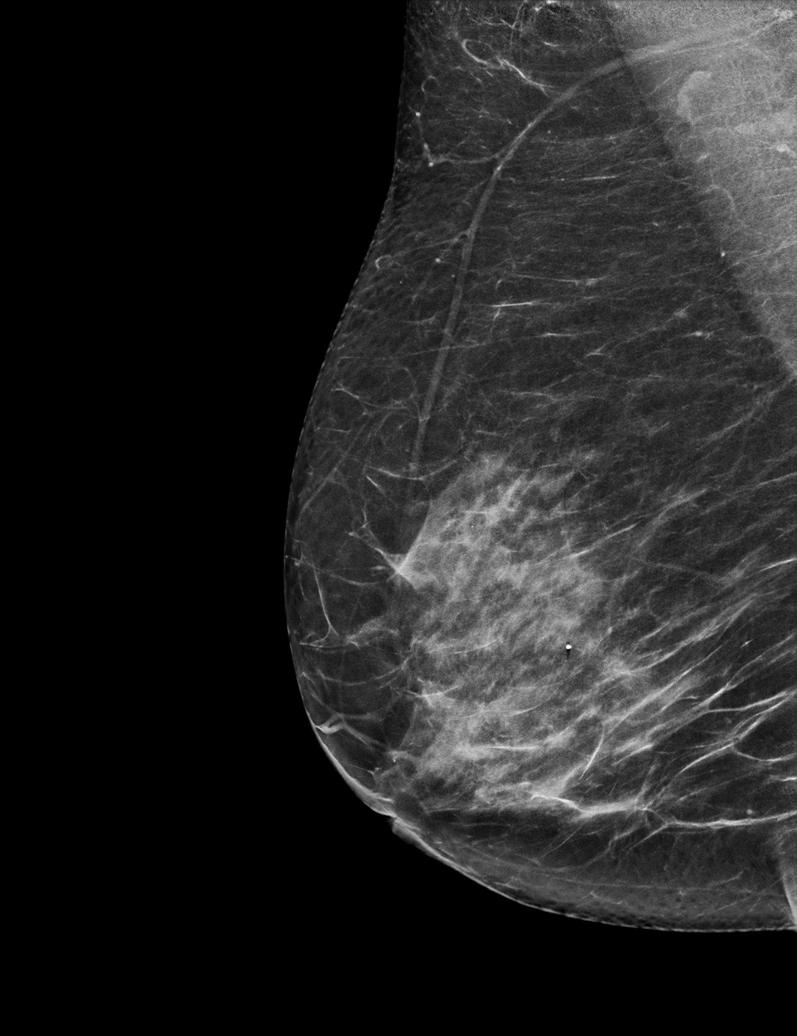

[L MLO tomo · tomo slice 33/65.0]
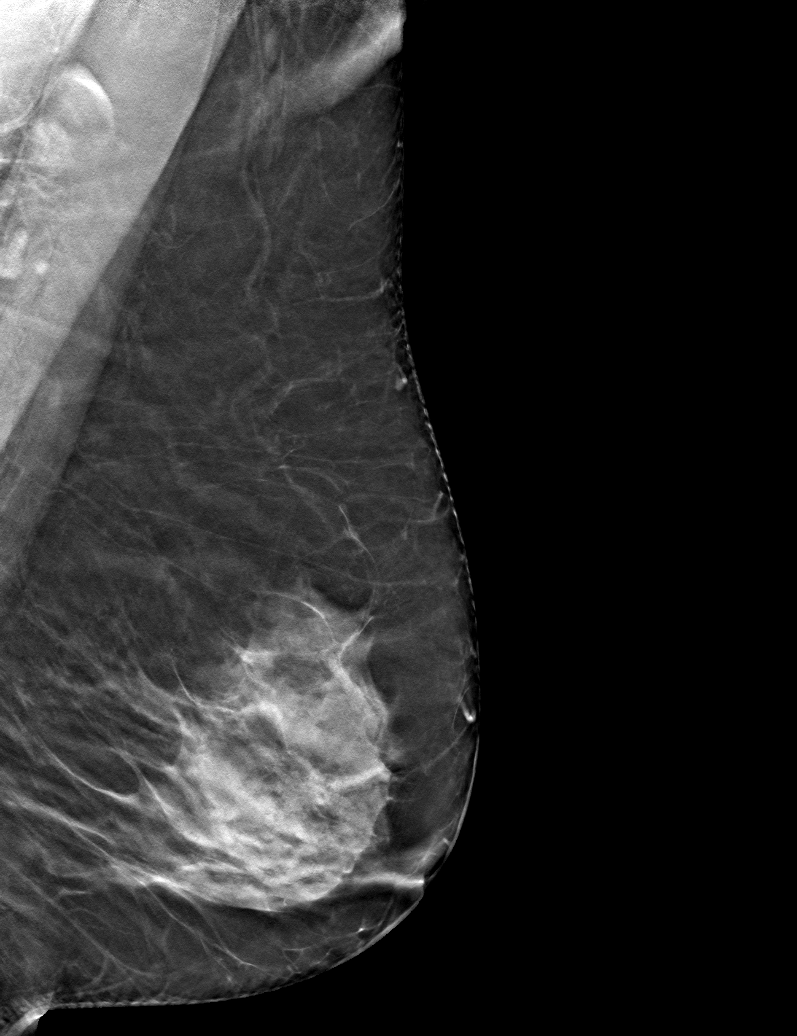

[6 of 30 positions shown; findings below may reference images not displayed]

ACR Breast Density Category c: The breast tissue is heterogeneously
dense, which may obscure small masses.
FINDINGS: There are no findings suspicious for malignancy. Images were
processed with CAD.
IMPRESSION: No mammographic evidence of malignancy. A result letter of this
screening mammogram will be mailed directly to the patient.

RECOMMENDATION:
Screening mammogram in one year. (Code:[5V])

BI-RADS CATEGORY  1: Negative.

## 2020-02-17 NOTE — Telephone Encounter (Signed)
Left message asking pt to call office  °

## 2020-02-24 ENCOUNTER — Other Ambulatory Visit: Payer: Self-pay | Admitting: Family Medicine

## 2020-02-24 DIAGNOSIS — I10 Essential (primary) hypertension: Secondary | ICD-10-CM

## 2020-02-25 DIAGNOSIS — D2271 Melanocytic nevi of right lower limb, including hip: Secondary | ICD-10-CM | POA: Diagnosis not present

## 2020-02-25 DIAGNOSIS — D2262 Melanocytic nevi of left upper limb, including shoulder: Secondary | ICD-10-CM | POA: Diagnosis not present

## 2020-02-25 DIAGNOSIS — D2261 Melanocytic nevi of right upper limb, including shoulder: Secondary | ICD-10-CM | POA: Diagnosis not present

## 2020-02-25 DIAGNOSIS — D2272 Melanocytic nevi of left lower limb, including hip: Secondary | ICD-10-CM | POA: Diagnosis not present

## 2020-02-25 DIAGNOSIS — L821 Other seborrheic keratosis: Secondary | ICD-10-CM | POA: Diagnosis not present

## 2020-02-25 DIAGNOSIS — L438 Other lichen planus: Secondary | ICD-10-CM | POA: Diagnosis not present

## 2020-02-25 DIAGNOSIS — D225 Melanocytic nevi of trunk: Secondary | ICD-10-CM | POA: Diagnosis not present

## 2020-02-29 DIAGNOSIS — Z96641 Presence of right artificial hip joint: Secondary | ICD-10-CM | POA: Diagnosis not present

## 2020-03-18 ENCOUNTER — Other Ambulatory Visit: Payer: Self-pay | Admitting: Family Medicine

## 2020-03-18 DIAGNOSIS — M19019 Primary osteoarthritis, unspecified shoulder: Secondary | ICD-10-CM

## 2020-03-20 NOTE — Telephone Encounter (Signed)
Refill request Tramadol Last refill 8/31/*21 #60/1 Last office visit 11/05/19

## 2020-04-05 ENCOUNTER — Other Ambulatory Visit: Payer: Self-pay | Admitting: Family Medicine

## 2020-04-05 DIAGNOSIS — M19042 Primary osteoarthritis, left hand: Secondary | ICD-10-CM

## 2020-04-05 DIAGNOSIS — M19019 Primary osteoarthritis, unspecified shoulder: Secondary | ICD-10-CM

## 2020-04-05 DIAGNOSIS — M19041 Primary osteoarthritis, right hand: Secondary | ICD-10-CM

## 2020-04-05 NOTE — Telephone Encounter (Signed)
Noted, refill sent to pharmacy. 

## 2020-04-05 NOTE — Telephone Encounter (Signed)
Patient left a voicemail stating that she needs a refill on her Gabapentin. Patient stated that the request originally went to the wrong doctor.  Last refill 01/07/20 #180 Last office visit 11/05/19 Upcoming appointment 04/17/20

## 2020-04-10 ENCOUNTER — Other Ambulatory Visit (INDEPENDENT_AMBULATORY_CARE_PROVIDER_SITE_OTHER): Payer: PPO

## 2020-04-10 ENCOUNTER — Other Ambulatory Visit: Payer: Self-pay

## 2020-04-10 ENCOUNTER — Other Ambulatory Visit: Payer: Self-pay | Admitting: Family Medicine

## 2020-04-10 DIAGNOSIS — E785 Hyperlipidemia, unspecified: Secondary | ICD-10-CM | POA: Diagnosis not present

## 2020-04-10 DIAGNOSIS — I10 Essential (primary) hypertension: Secondary | ICD-10-CM | POA: Diagnosis not present

## 2020-04-10 DIAGNOSIS — E039 Hypothyroidism, unspecified: Secondary | ICD-10-CM

## 2020-04-10 DIAGNOSIS — E038 Other specified hypothyroidism: Secondary | ICD-10-CM

## 2020-04-10 DIAGNOSIS — M8580 Other specified disorders of bone density and structure, unspecified site: Secondary | ICD-10-CM | POA: Diagnosis not present

## 2020-04-10 LAB — TSH: TSH: 0.22 u[IU]/mL — ABNORMAL LOW (ref 0.35–4.50)

## 2020-04-10 LAB — LIPID PANEL
Cholesterol: 169 mg/dL (ref 0–200)
HDL: 71.4 mg/dL (ref >39.00)
LDL Cholesterol: 83 mg/dL (ref 0–99)
NonHDL: 97.79
Total CHOL/HDL Ratio: 2
Triglycerides: 72 mg/dL (ref 0.0–149.0)
VLDL: 14.4 mg/dL (ref 0.0–40.0)

## 2020-04-10 LAB — VITAMIN D 25 HYDROXY (VIT D DEFICIENCY, FRACTURES): VITD: 41.06 ng/mL (ref 30.00–100.00)

## 2020-04-11 MED ORDER — LEVOTHYROXINE SODIUM 88 MCG PO TABS: 88.0000 ug | ORAL_TABLET | Freq: Every day | ORAL | 0 refills | Status: DC

## 2020-04-11 NOTE — Addendum Note (Signed)
Addended by: Clarene Reamer B on: 04/11/2020 07:51 PM   Modules accepted: Orders

## 2020-04-17 ENCOUNTER — Other Ambulatory Visit: Payer: Self-pay

## 2020-04-17 ENCOUNTER — Encounter: Payer: Self-pay | Admitting: Family Medicine

## 2020-04-17 ENCOUNTER — Ambulatory Visit (INDEPENDENT_AMBULATORY_CARE_PROVIDER_SITE_OTHER): Payer: PPO | Admitting: Family Medicine

## 2020-04-17 VITALS — BP 140/78 | HR 68 | Temp 98.0°F | Ht 63.0 in | Wt 149.1 lb

## 2020-04-17 DIAGNOSIS — E038 Other specified hypothyroidism: Secondary | ICD-10-CM | POA: Diagnosis not present

## 2020-04-17 DIAGNOSIS — I10 Essential (primary) hypertension: Secondary | ICD-10-CM

## 2020-04-17 DIAGNOSIS — Z0000 Encounter for general adult medical examination without abnormal findings: Secondary | ICD-10-CM

## 2020-04-17 DIAGNOSIS — R42 Dizziness and giddiness: Secondary | ICD-10-CM | POA: Diagnosis not present

## 2020-04-17 NOTE — Patient Instructions (Signed)
So good to see you today  For lightheadedness- hydrate, change position slowly, try some compression socks when on your feet for a long time  Please schedule to establish with new provider in 6 months  Get your TSH checked before you run out of your current prescription

## 2020-04-17 NOTE — Progress Notes (Signed)
Subjective:    Patient ID: Heather Potter, female    DOB: 10/03/1945, 74 y.o.   MRN: [id number]  HPI Chief Complaint  Patient presents with  . Annual Exam    leg swelling/ dizzy     Last CPE- 01/11/2019 Mammo- 02/17/2020 Pap- no longer screenig Colonoscopy- 12/19/2010 Tdap- declines due to insurance Flu- annual Covid 19 vaccine- vaccinated Eye- not for several years, no change in vision Dental- regular Exercise- housework, gardening, stays busy and active  Leg swelling- left lower leg, comes and goes, noticed after standing on feet and cooking for holidays  Dizziness- when looking back in shower, sometimes out of the blue, not always associated with positional change. Not spinning, feels off balance. Does not feel like she is going to pass out. Does not happen daily.   Back pain- had MRI, arthritis, left leg pain, pain comes and goes. Feels that overall her pain is manageable.   Bilateral hand pain- base of thumbs, "shocks," multiple times throughout day. Uses Volatren gel. Little morning stiffness. Does not interfere with sleep.   RA- currently on 2.5 mg prednisone, taking MTX, folic acid.    Review of Systems  Constitutional: Positive for fatigue (feels like she does well for her age). Negative for activity change and unexpected weight change.  HENT: Negative.   Eyes: Negative.   Respiratory: Negative.   Cardiovascular: Negative.   Gastrointestinal: Positive for constipation (rare, takes vegetable laxative with good relief. ). Negative for abdominal pain, diarrhea, nausea and vomiting.  Endocrine: Negative.   Genitourinary: Negative.   Musculoskeletal: Positive for arthralgias, back pain and joint swelling.  Skin: Negative.   Allergic/Immunologic: Negative.   Neurological: Positive for light-headedness. Negative for weakness and headaches.  Hematological: Negative.   Psychiatric/Behavioral: Negative.        Objective:   Physical Exam Vitals reviewed.    Constitutional:      General: She is not in acute distress.    Appearance: Normal appearance. She is normal weight. She is not ill-appearing, toxic-appearing or diaphoretic.  HENT:     Head: Normocephalic and atraumatic.     Right Ear: External ear normal.     Left Ear: External ear normal.  Eyes:     Conjunctiva/sclera: Conjunctivae normal.  Cardiovascular:     Rate and Rhythm: Normal rate and regular rhythm.     Pulses: Normal pulses.     Heart sounds: Normal heart sounds.  Pulmonary:     Effort: Pulmonary effort is normal.     Breath sounds: Normal breath sounds.  Abdominal:     General: Abdomen is flat. Bowel sounds are normal. There is no distension.     Palpations: Abdomen is soft. There is no mass.     Tenderness: There is no abdominal tenderness. There is no guarding or rebound.     Hernia: No hernia is present.  Musculoskeletal:        General: Swelling (bilateral hand PIP/DIP) and deformity present.     Cervical back: Normal range of motion and neck supple.     Right lower leg: No edema.     Left lower leg: No edema.  Skin:    General: Skin is warm and dry.     Comments: Lower legs with healing excoriations. Per patient, from gardening.   Neurological:     Mental Status: She is alert and oriented to person, place, and time.  Psychiatric:        Mood and Affect: Mood normal.  Behavior: Behavior normal.        Thought Content: Thought content normal.        Judgment: Judgment normal.       BP 140/78   Pulse 68   Temp 98 F (36.7 C) (Tympanic)   Ht 5\' 3"  (1.6 m)   Wt 149 lb 1.3 oz (67.6 kg)   SpO2 99%   BMI 26.41 kg/m  Wt Readings from Last 3 Encounters:  04/17/20 149 lb 1.3 oz (67.6 kg)  11/05/19 146 lb 6.4 oz (66.4 kg)  07/23/19 148 lb 12.8 oz (67.5 kg)   Depression screen [facility] 2/9 04/17/2020 10/11/2019 01/11/2019 09/07/2018 08/26/2017  Decreased Interest 0 0 0 0 1  Down, Depressed, Hopeless 0 0 0 0 1  PHQ - 2 Score 0 0 0 0 2  Altered sleeping - 0  - 0 1  Tired, decreased energy - 0 - 0 1  Change in appetite - 0 - 0 0  Feeling bad or failure about yourself  - 0 - 0 0  Trouble concentrating - 0 - 0 0  Moving slowly or fidgety/restless - 0 - 0 0  Suicidal thoughts - 0 - 0 0  PHQ-9 Score - 0 - 0 4  Difficult doing work/chores - Not difficult at all - Not difficult at all Somewhat difficult       Assessment & Plan:  1. Annual physical exam - Discussed and encouraged healthy lifestyle choices- adequate sleep, regular exercise, stress management and healthy food choices.    2. Other specified hypothyroidism - TSH low at last check, levothyroxine dose decreased, she will schedule follow up testing   3. Essential hypertension - well controlled  4. Episodic lightheadedness - discussed contributing factors, encouraged good fluid intake, slowly change positions, compression socks when standing for prolonged periods of time  - follow up in 6 months  This visit occurred during the SARS-CoV-2 public health emergency.  Safety protocols were in place, including screening questions prior to the visit, additional usage of staff PPE, and extensive cleaning of exam room while observing appropriate contact time as indicated for disinfecting solutions.      Clarene Reamer, FNP-BC  [facility]  04/17/2020 11:26 AM

## 2020-05-09 ENCOUNTER — Encounter: Payer: Self-pay | Admitting: Family Medicine

## 2020-05-17 DIAGNOSIS — M8949 Other hypertrophic osteoarthropathy, multiple sites: Secondary | ICD-10-CM | POA: Diagnosis not present

## 2020-05-17 DIAGNOSIS — M0609 Rheumatoid arthritis without rheumatoid factor, multiple sites: Secondary | ICD-10-CM | POA: Diagnosis not present

## 2020-05-17 DIAGNOSIS — Z79899 Other long term (current) drug therapy: Secondary | ICD-10-CM | POA: Diagnosis not present

## 2020-05-17 DIAGNOSIS — M353 Polymyalgia rheumatica: Secondary | ICD-10-CM | POA: Diagnosis not present

## 2020-05-17 DIAGNOSIS — Z7952 Long term (current) use of systemic steroids: Secondary | ICD-10-CM | POA: Diagnosis not present

## 2020-05-30 ENCOUNTER — Telehealth: Payer: Self-pay

## 2020-05-30 DIAGNOSIS — I10 Essential (primary) hypertension: Secondary | ICD-10-CM

## 2020-05-30 MED ORDER — HYDROCHLOROTHIAZIDE 25 MG PO TABS: 25.0000 mg | ORAL_TABLET | Freq: Every day | ORAL | 1 refills | Status: DC

## 2020-05-30 NOTE — Telephone Encounter (Signed)
Pharmacy requests refill on: Hydrochlorothiazide 25 mg   LAST REFILL: 02/24/2020 LAST OV: 04/17/2020 NEXT OV: 06/20/2020 PHARMACY: [facility] #[id number] [place], [place]

## 2020-06-13 ENCOUNTER — Other Ambulatory Visit: Payer: Self-pay

## 2020-06-13 DIAGNOSIS — M19019 Primary osteoarthritis, unspecified shoulder: Secondary | ICD-10-CM

## 2020-06-13 MED ORDER — TRAMADOL HCL 50 MG PO TABS
ORAL_TABLET | ORAL | 0 refills | Status: DC
Start: 2020-06-13 — End: 2020-07-14

## 2020-06-13 NOTE — Telephone Encounter (Signed)
Fax from [facility] requesting tramadol refill.  Last apt 04/17/20 Next TOC apt with Dr. Diona Browner 06/20/20 Last fill   03/20/21, #60, 2 RF

## 2020-06-20 ENCOUNTER — Ambulatory Visit (INDEPENDENT_AMBULATORY_CARE_PROVIDER_SITE_OTHER): Payer: PPO | Admitting: Family Medicine

## 2020-06-20 ENCOUNTER — Other Ambulatory Visit: Payer: Self-pay

## 2020-06-20 ENCOUNTER — Encounter: Payer: Self-pay | Admitting: Family Medicine

## 2020-06-20 VITALS — BP 118/62 | HR 68 | Temp 97.9°F | Ht 63.0 in | Wt 148.5 lb

## 2020-06-20 DIAGNOSIS — M353 Polymyalgia rheumatica: Secondary | ICD-10-CM | POA: Diagnosis not present

## 2020-06-20 DIAGNOSIS — I10 Essential (primary) hypertension: Secondary | ICD-10-CM

## 2020-06-20 DIAGNOSIS — E038 Other specified hypothyroidism: Secondary | ICD-10-CM

## 2020-06-20 DIAGNOSIS — E785 Hyperlipidemia, unspecified: Secondary | ICD-10-CM

## 2020-06-20 DIAGNOSIS — E039 Hypothyroidism, unspecified: Secondary | ICD-10-CM

## 2020-06-20 DIAGNOSIS — Z7952 Long term (current) use of systemic steroids: Secondary | ICD-10-CM

## 2020-06-20 DIAGNOSIS — Z905 Acquired absence of kidney: Secondary | ICD-10-CM | POA: Diagnosis not present

## 2020-06-20 DIAGNOSIS — L439 Lichen planus, unspecified: Secondary | ICD-10-CM

## 2020-06-20 LAB — TSH: TSH: 0.53 u[IU]/mL (ref 0.35–4.50)

## 2020-06-20 NOTE — Addendum Note (Signed)
Addended by: Ellamae Sia on: 06/20/2020 09:37 AM   Modules accepted: Orders

## 2020-06-20 NOTE — Patient Instructions (Signed)
Please stop at the lab to have labs drawn.  

## 2020-06-20 NOTE — Assessment & Plan Note (Signed)
Stable, chronic.  Continue current medication.   HCTZ 25 mg daily  amlodipine 10 mg daily

## 2020-06-20 NOTE — Progress Notes (Signed)
Patient ID: Heather Potter, female    DOB: [birth year], 75 y.o.   MRN: [id number]  This visit was conducted in person.  BP 118/62   Pulse 68   Temp 97.9 F (36.6 C) (Temporal)   Ht 5\' 3"  (1.6 m)   Wt 148 lb 8 oz (67.4 kg)   SpO2 99%   BMI 26.31 kg/m    CC:  Chief Complaint  Patient presents with  . Establish Care    TOC from D. Gessner    Subjective:   HPI: Heather Potter is a 75 y.o. female presenting on 06/20/2020 for Establish Care (TOC from D. Carlean Purl)   Last CPX:  04/17/20  Polymyalgia rheumatica, possible rheumatoid arthritis neg RF, osteoarthritis: Dx 2-3 year ago.  Followed by Dr. Posey Pronto.  Using methotrexate weekly. Using prednisone 2.5 mg daily. On gabapentin at night for chronic pain control... helps her sleep. Using tramadol   1 tab po q 12 hours for pain.  Hx of avascular necrosis right hip.. THR 2020    Hypothyroid : recent change from 75 mcg to 100 mcg, now down to 88 mcg. Due for re-eval on new level.  Hyperlipidemia  Well controlled at last check on simvastatin. Lab Results  Component Value Date   CHOL 169 04/10/2020   HDL 71.40 04/10/2020   LDLCALC 83 04/10/2020   TRIG 72.0 04/10/2020   CHOLHDL 2 04/10/2020   HTN  Well controlled on  Amlodipine and HCTZ. BP Readings from Last 3 Encounters:  06/20/20 118/62  04/17/20 140/78  11/05/19 138/78     Relevant past medical, surgical, family and social history reviewed and updated as indicated. Interim medical history since our last visit reviewed. Allergies and medications reviewed and updated. Outpatient Medications Prior to Visit  Medication Sig Dispense Refill  . acetaminophen (TYLENOL) 650 MG CR tablet Take 650 mg by mouth every 8 (eight) hours as needed for pain.    Marland Kitchen amLODipine (NORVASC) 10 MG tablet Take 1 tablet (10 mg total) by mouth daily. 90 tablet 3  . aspirin EC 81 MG tablet Take 81 mg by mouth daily. Swallow whole.    . Calcium Carbonate-Vit D-Min (CALCIUM 1200 PO) Take 600 mg by  mouth 2 (two) times daily.     . cholecalciferol (VITAMIN D) 400 units TABS tablet Take 400 Units by mouth at bedtime.     . diclofenac Sodium (VOLTAREN) 1 % GEL Apply 2 g topically 4 (four) times daily.    . folic acid (FOLVITE) 1 MG tablet Take 1 mg by mouth daily.    Marland Kitchen gabapentin (NEURONTIN) 100 MG capsule Take 1 capsule by mouth twice daily 180 capsule 0  . hydrochlorothiazide (HYDRODIURIL) 25 MG tablet Take 1 tablet (25 mg total) by mouth daily. 90 tablet 1  . levothyroxine (SYNTHROID) 88 MCG tablet Take 1 tablet (88 mcg total) by mouth daily. 90 tablet 0  . methotrexate 2.5 MG tablet TAKE 6 TABLETS BY MOUTH ONCE A WEEK (ALL ON THE SAME DAY)    . predniSONE (DELTASONE) 2.5 MG tablet Take by mouth.    . simvastatin (ZOCOR) 20 MG tablet TAKE 1/2 (ONE-HALF) TABLET BY MOUTH ONCE DAILY AT  6PM  -  DUE  FOR  PHYSICAL  AUGUST  2021 90 tablet 0  . traMADol (ULTRAM) 50 MG tablet TAKE 1 TABLET BY MOUTH EVERY 12 HOURS AS NEEDED FOR SEVERE PAIN 60 tablet 0  . TURMERIC PO Take 1 capsule by mouth daily.    Marland Kitchen  acetaminophen (TYLENOL) 500 MG tablet Take 500 mg by mouth every 6 (six) hours as needed for mild pain or moderate pain.     . predniSONE (DELTASONE) 1 MG tablet Take 2 tablets (2 mg total) by mouth daily with breakfast. Will be taking with 5 mg tablets for titration. (Patient taking differently: Take 1/2 tablet daily) 180 tablet 1   No facility-administered medications prior to visit.     Per HPI unless specifically indicated in ROS section below Review of Systems  Constitutional: Negative for fatigue and fever.  HENT: Negative for congestion.   Eyes: Negative for pain.  Respiratory: Negative for cough and shortness of breath.   Cardiovascular: Negative for chest pain, palpitations and leg swelling.  Gastrointestinal: Negative for abdominal pain.  Genitourinary: Negative for dysuria and vaginal bleeding.  Musculoskeletal: Negative for back pain.  Neurological: Negative for syncope,  light-headedness and headaches.  Psychiatric/Behavioral: Negative for dysphoric mood.   Objective:  BP 118/62   Pulse 68   Temp 97.9 F (36.6 C) (Temporal)   Ht 5\' 3"  (1.6 m)   Wt 148 lb 8 oz (67.4 kg)   SpO2 99%   BMI 26.31 kg/m   Wt Readings from Last 3 Encounters:  06/20/20 148 lb 8 oz (67.4 kg)  04/17/20 149 lb 1.3 oz (67.6 kg)  11/05/19 146 lb 6.4 oz (66.4 kg)      Physical Exam Constitutional:      General: She is not in acute distress.Vital signs are normal.     Appearance: Normal appearance. She is well-developed and well-nourished. She is not ill-appearing or toxic-appearing.  HENT:     Head: Normocephalic.     Right Ear: Hearing, tympanic membrane, ear canal and external ear normal. Tympanic membrane is not erythematous, retracted or bulging.     Left Ear: Hearing, tympanic membrane, ear canal and external ear normal. Tympanic membrane is not erythematous, retracted or bulging.     Nose: No mucosal edema or rhinorrhea.     Right Sinus: No maxillary sinus tenderness or frontal sinus tenderness.     Left Sinus: No maxillary sinus tenderness or frontal sinus tenderness.     Mouth/Throat:     Mouth: Oropharynx is clear and moist and mucous membranes are normal.     Pharynx: Uvula midline.  Eyes:     General: Lids are normal. Lids are everted, no foreign bodies appreciated.     Extraocular Movements: EOM normal.     Conjunctiva/sclera: Conjunctivae normal.     Pupils: Pupils are equal, round, and reactive to light.  Neck:     Thyroid: No thyroid mass or thyromegaly.     Vascular: No carotid bruit.     Trachea: Trachea normal.  Cardiovascular:     Rate and Rhythm: Normal rate and regular rhythm.     Pulses: Normal pulses and intact distal pulses.     Heart sounds: Normal heart sounds, S1 normal and S2 normal. No murmur heard. No friction rub. No gallop.   Pulmonary:     Effort: Pulmonary effort is normal. No tachypnea or respiratory distress.     Breath sounds:  Normal breath sounds. No decreased breath sounds, wheezing, rhonchi or rales.  Abdominal:     General: Bowel sounds are normal.     Palpations: Abdomen is soft.     Tenderness: There is no abdominal tenderness.  Musculoskeletal:     Cervical back: Normal range of motion and neck supple.  Skin:    General: Skin is  warm, dry and intact.     Findings: No rash.  Neurological:     Mental Status: She is alert.  Psychiatric:        Mood and Affect: Mood is not anxious or depressed.        Speech: Speech normal.        Behavior: Behavior normal. Behavior is cooperative.        Thought Content: Thought content normal.        Cognition and Memory: Cognition and memory normal.        Judgment: Judgment normal.       Results for orders placed or performed in visit on 04/10/20  VITAMIN D 25 Hydroxy (Vit-D Deficiency, Fractures)  Result Value Ref Range   VITD 41.06 30.00 - 100.00 ng/mL  TSH  Result Value Ref Range   TSH 0.22 (L) 0.35 - 4.50 uIU/mL  Lipid panel  Result Value Ref Range   Cholesterol 169 0 - 200 mg/dL   Triglycerides 72.0 0.0 - 149.0 mg/dL   HDL 71.40 >39.00 mg/dL   VLDL 14.4 0.0 - 40.0 mg/dL   LDL Cholesterol 83 0 - 99 mg/dL   Total CHOL/HDL Ratio 2    NonHDL 97.79     This visit occurred during the SARS-CoV-2 public health emergency.  Safety protocols were in place, including screening questions prior to the visit, additional usage of staff PPE, and extensive cleaning of exam room while observing appropriate contact time as indicated for disinfecting solutions.   COVID 19 screen:  No recent travel or known exposure to COVID19 The patient denies respiratory symptoms of COVID 19 at this time. The importance of social distancing was discussed today.   Assessment and Plan   PDMP reviewed during this encounter.  Problem List Items Addressed This Visit    Current chronic use of systemic steroids - Primary    High risk  Use of steroid chronically. 2.5 mg daily  currently.      Essential hypertension    Stable, chronic.  Continue current medication.   HCTZ 25 mg daily  amlodipine 10 mg daily      Relevant Medications   aspirin EC 81 MG tablet   HLD (hyperlipidemia) (Chronic)    Stable, chronic.  Continue current medication.   Simvastatin 10 mg daily  No SE.      Relevant Medications   aspirin EC 81 MG tablet   Hypothyroidism (Chronic)    recent change from 75 mcg to 100 mcg, now down to 88 mcg. Due for re-eval on new level.. check today.      Lichen planus (Chronic)     Followed by der, yearly. Dr. Kellie Moor.      NEPHRECTOMY, HX OF   PMR (polymyalgia rheumatica) (HCC) (Chronic)    Dx 2 year ago.  Followed by Dr. Posey Pronto.  Using methotrexate weekly. Using prednisone 2.5 mg daily.  Fairly well controlled using tramadol   1 tab po q 12 hours for pain.          Reviewed all meds in detail and verified doses. No refills needed today.Discussed plan to refill tramadol monthly and controlled substance precautions/monitoring discussed in detail.  Eliezer Lofts, MD

## 2020-06-20 NOTE — Assessment & Plan Note (Addendum)
Dx 2 year ago.  Followed by Dr. Posey Pronto.  Using methotrexate weekly. Using prednisone 2.5 mg daily.  Fairly well controlled using tramadol   1 tab po q 12 hours for pain.

## 2020-06-20 NOTE — Assessment & Plan Note (Signed)
Stable, chronic.  Continue current medication.   Simvastatin 10 mg daily  No SE.

## 2020-06-20 NOTE — Assessment & Plan Note (Signed)
Followed by der, yearly. Dr. Kellie Moor.

## 2020-06-20 NOTE — Assessment & Plan Note (Signed)
High risk  Use of steroid chronically. 2.5 mg daily currently.

## 2020-06-20 NOTE — Assessment & Plan Note (Signed)
recent change from 75 mcg to 100 mcg, now down to 88 mcg. Due for re-eval on new level.. check today.

## 2020-06-26 ENCOUNTER — Telehealth: Payer: Self-pay | Admitting: *Deleted

## 2020-06-26 DIAGNOSIS — E039 Hypothyroidism, unspecified: Secondary | ICD-10-CM

## 2020-06-26 MED ORDER — LEVOTHYROXINE SODIUM 88 MCG PO TABS: 88.0000 ug | ORAL_TABLET | Freq: Every day | ORAL | 3 refills | Status: DC

## 2020-06-26 NOTE — Telephone Encounter (Signed)
Heather Potter notified as instructed by telephone.  Refills sent to [facility] as instructed by Dr. Diona Browner.

## 2020-06-26 NOTE — Telephone Encounter (Signed)
Yes.. thyroid levels stable.. please refill for years at current dose.

## 2020-06-26 NOTE — Telephone Encounter (Signed)
Patient left a voicemail stating that she saw Dr. Diona Browner 06/20/20 and her thyroid was checked. Patient stated.that she is currently on levothyroxine 88 mg and wants to know if she is to continue that dose. Patient stated that she needs a refill on her thyroid medication [place].

## 2020-07-14 ENCOUNTER — Other Ambulatory Visit: Payer: Self-pay | Admitting: Primary Care

## 2020-07-14 ENCOUNTER — Other Ambulatory Visit: Payer: Self-pay | Admitting: Family Medicine

## 2020-07-14 DIAGNOSIS — M19042 Primary osteoarthritis, left hand: Secondary | ICD-10-CM

## 2020-07-14 DIAGNOSIS — M19019 Primary osteoarthritis, unspecified shoulder: Secondary | ICD-10-CM

## 2020-07-14 DIAGNOSIS — M19041 Primary osteoarthritis, right hand: Secondary | ICD-10-CM

## 2020-07-14 NOTE — Telephone Encounter (Signed)
To PCP

## 2020-07-14 NOTE — Telephone Encounter (Signed)
Last office visit 06/20/2020 for establish care.  Last refilled 06/13/2020 for #60 with no refills.  CPE scheduled for 04/20/2021.

## 2020-08-07 ENCOUNTER — Telehealth: Payer: Self-pay

## 2020-08-07 ENCOUNTER — Other Ambulatory Visit: Payer: Self-pay | Admitting: Family Medicine

## 2020-08-07 DIAGNOSIS — E785 Hyperlipidemia, unspecified: Secondary | ICD-10-CM

## 2020-08-07 DIAGNOSIS — M19019 Primary osteoarthritis, unspecified shoulder: Secondary | ICD-10-CM

## 2020-08-07 MED ORDER — SIMVASTATIN 20 MG PO TABS: ORAL_TABLET | ORAL | 0 refills | Status: DC

## 2020-08-07 NOTE — Telephone Encounter (Signed)
Pharmacy requests refill on: Simvastatin 20 mg   LAST REFILL: 02/14/2020 (Q-90, R-0) LAST OV: 06/20/2020 NEXT OV: 04/20/2021 PHARMACY: [facility] #[id number] [place], [place]

## 2020-08-07 NOTE — Telephone Encounter (Signed)
Last office visit 06/20/2020 for establish care.  Last refilled 07/14/2020 for #60 with no refills.  CPE scheduled for 04/20/21.

## 2020-08-14 ENCOUNTER — Telehealth: Payer: Self-pay

## 2020-08-14 DIAGNOSIS — I10 Essential (primary) hypertension: Secondary | ICD-10-CM

## 2020-08-14 MED ORDER — AMLODIPINE BESYLATE 10 MG PO TABS: 10.0000 mg | ORAL_TABLET | Freq: Every day | ORAL | 1 refills | Status: DC

## 2020-08-14 NOTE — Telephone Encounter (Signed)
Pharmacy requests refill on: Amlodipine 10 mg   LAST REFILL: 08/04/2019 (Q-90, R-3) LAST OV: 06/20/2020 NEXT OV: 04/20/2021 PHARMACY: [facility] #[id number] [place], [place]

## 2020-08-15 DIAGNOSIS — M7551 Bursitis of right shoulder: Secondary | ICD-10-CM | POA: Diagnosis not present

## 2020-08-15 DIAGNOSIS — M0609 Rheumatoid arthritis without rheumatoid factor, multiple sites: Secondary | ICD-10-CM | POA: Diagnosis not present

## 2020-08-15 DIAGNOSIS — M8949 Other hypertrophic osteoarthropathy, multiple sites: Secondary | ICD-10-CM | POA: Diagnosis not present

## 2020-08-15 DIAGNOSIS — M353 Polymyalgia rheumatica: Secondary | ICD-10-CM | POA: Diagnosis not present

## 2020-08-15 DIAGNOSIS — Z79899 Other long term (current) drug therapy: Secondary | ICD-10-CM | POA: Diagnosis not present

## 2020-09-05 ENCOUNTER — Other Ambulatory Visit: Payer: Self-pay | Admitting: Family Medicine

## 2020-09-05 DIAGNOSIS — M19019 Primary osteoarthritis, unspecified shoulder: Secondary | ICD-10-CM

## 2020-09-05 NOTE — Telephone Encounter (Signed)
Last office visit 06/20/2020 for Establish Care.  Last refilled 08/08/2020 for #60 with no refills.  CPE scheduled for 04/20/2021.

## 2020-09-29 DIAGNOSIS — H2513 Age-related nuclear cataract, bilateral: Secondary | ICD-10-CM | POA: Diagnosis not present

## 2020-10-02 ENCOUNTER — Other Ambulatory Visit: Payer: Self-pay | Admitting: Family Medicine

## 2020-10-02 DIAGNOSIS — M19019 Primary osteoarthritis, unspecified shoulder: Secondary | ICD-10-CM

## 2020-10-02 DIAGNOSIS — M19042 Primary osteoarthritis, left hand: Secondary | ICD-10-CM

## 2020-10-02 DIAGNOSIS — M19041 Primary osteoarthritis, right hand: Secondary | ICD-10-CM

## 2020-10-10 ENCOUNTER — Other Ambulatory Visit: Payer: Self-pay | Admitting: Family Medicine

## 2020-10-10 DIAGNOSIS — M19019 Primary osteoarthritis, unspecified shoulder: Secondary | ICD-10-CM

## 2020-10-10 NOTE — Telephone Encounter (Signed)
Last office visit 06/20/2020 for Establish care.  Last refilled 09/05/2020 for #60 with no refills.  CPE scheduled for 04/20/2021.

## 2020-10-11 ENCOUNTER — Ambulatory Visit (INDEPENDENT_AMBULATORY_CARE_PROVIDER_SITE_OTHER): Payer: PPO

## 2020-10-11 ENCOUNTER — Other Ambulatory Visit: Payer: Self-pay

## 2020-10-11 DIAGNOSIS — Z0000 Encounter for general adult medical examination without abnormal findings: Secondary | ICD-10-CM

## 2020-10-11 NOTE — Progress Notes (Signed)
Subjective:   Heather Potter is a 75 y.o. female who presents for Medicare Annual (Subsequent) preventive examination.  Review of Systems: N/A     I connected with the patient today by telephone and verified that I am speaking with the correct person using two identifiers. Location patient: home Location nurse: work Persons participating in the telephone visit: patient, nurse.   I discussed the limitations, risks, security and privacy concerns of performing an evaluation and management service by telephone and the availability of in person appointments. I also discussed with the patient that there may be a patient responsible charge related to this service. The patient expressed understanding and verbally consented to this telephonic visit.        Cardiac Risk Factors include: advanced age (>35men, >68 women);Other (see comment), Risk factor comments: hyperlipidemia     Objective:    Today's Vitals   There is no height or weight on file to calculate BMI.  Advanced Directives 10/11/2020 10/11/2019 03/03/2019 03/03/2019 02/23/2019 09/07/2018 08/26/2017  Does Patient Have a Medical Advance Directive? No No No No No No No  Does patient want to make changes to medical advance directive? - - No - Patient declined - - - -  Would patient like information on creating a medical advance directive? No - Patient declined No - Patient declined No - Patient declined No - Patient declined No - Patient declined No - Patient declined No - Patient declined    Current Medications (verified) Outpatient Encounter Medications as of 10/11/2020  Medication Sig  . acetaminophen (TYLENOL) 650 MG CR tablet Take 650 mg by mouth every 8 (eight) hours as needed for pain.  Marland Kitchen amLODipine (NORVASC) 10 MG tablet Take 1 tablet (10 mg total) by mouth daily.  Marland Kitchen aspirin EC 81 MG tablet Take 81 mg by mouth daily. Swallow whole.  . Calcium Carbonate-Vit D-Min (CALCIUM 1200 PO) Take 600 mg by mouth 2 (two) times daily.    . cholecalciferol (VITAMIN D) 400 units TABS tablet Take 400 Units by mouth at bedtime.   . diclofenac Sodium (VOLTAREN) 1 % GEL Apply 2 g topically 4 (four) times daily.  . folic acid (FOLVITE) 1 MG tablet Take 1 mg by mouth daily.  Marland Kitchen gabapentin (NEURONTIN) 100 MG capsule Take 1 capsule by mouth twice daily  . hydrochlorothiazide (HYDRODIURIL) 25 MG tablet Take 1 tablet (25 mg total) by mouth daily.  Marland Kitchen levothyroxine (SYNTHROID) 88 MCG tablet Take 1 tablet (88 mcg total) by mouth daily.  . methotrexate 2.5 MG tablet TAKE 6 TABLETS BY MOUTH ONCE A WEEK (ALL ON THE SAME DAY)  . predniSONE (DELTASONE) 2.5 MG tablet Take by mouth. Every other day  . simvastatin (ZOCOR) 20 MG tablet TAKE 1/2 (ONE-HALF) TABLET BY MOUTH ONCE DAILY AT  6PM  -  DUE  FOR  PHYSICAL  AUGUST  2021  . traMADol (ULTRAM) 50 MG tablet TAKE 1 TABLET BY MOUTH EVERY 12 HOURS AS NEEDED FOR SEVERE PAIN  . TURMERIC PO Take 1 capsule by mouth daily.   No facility-administered encounter medications on file as of 10/11/2020.    Allergies (verified) Patient has no known allergies.   History: Past Medical History:  Diagnosis Date  . Colitis   . History of blood transfusion    D and C and miscarriage  . History of gynecologic surgery    had right ovary removed, unsure about uterus/cervix  . Hyperlipidemia   . Hypertension   . Hypothyroidism   . Lichen  planus 07/04/2017   Treated by Dr. Kellie Moor (derm) with methotrexate  . Osteoarthritis   . Osteopenia    Past Surgical History:  Procedure Laterality Date  . APPENDECTOMY    . [place]   colectomy for ? Diverticulits  . DILATION AND CURETTAGE OF UTERUS    . ESOPHAGEAL MANOMETRY N/A 08/21/2015   Procedure: ESOPHAGEAL MANOMETRY (EM);  Surgeon: Mauri Pole, MD;  Location: [facility];  Service: Endoscopy;  Laterality: N/A;  . HERNIA REPAIR     right lower abd  . 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 OOPHORECTOMY     right  . OTHER SURGICAL  HISTORY     part of intestine removed  . TOTAL HIP ARTHROPLASTY Right 03/03/2019   Procedure: TOTAL HIP ARTHROPLASTY;  Surgeon: Dereck Leep, MD;  Location: [facility];  Service: Orthopedics;  Laterality: Right;  . TUBAL LIGATION     bilat   Family History  Problem Relation Age of Onset  . Pancreatic cancer Mother   . Heart attack Father   . Heart attack Brother   . Liver cancer Brother   . Asthma Brother   . Colon cancer Neg Hx   . Breast cancer Neg Hx    Social History   Socioeconomic History  . Marital status: Married    Spouse name: Not on file  . Number of children: 1  . Years of education: Not on file  . Highest education level: Not on file  Occupational History  . Occupation: Homemaker  Tobacco Use  . Smoking status: Former Smoker    Packs/day: 1.00    Years: 40.00    Pack years: 40.00    Quit date: 05/21/2007    Years since quitting: 13.4  . Smokeless tobacco: Never Used  Vaping Use  . Vaping Use: Never used  Substance and Sexual Activity  . Alcohol use: Yes    Comment: rare- wine  . Drug use: No  . Sexual activity: Not Currently  Other Topics Concern  . Not on file  Social History Narrative   One adopted son   Regular exercise: yes      Does not have a living will or HPOA.   Desires CPR and life support for only short period if not futile.         Social Determinants of Health   Financial Resource Strain: Low Risk   . Difficulty of Paying Living Expenses: Not hard at all  Food Insecurity: No Food Insecurity  . Worried About Charity fundraiser in the Last Year: Never true  . Ran Out of Food in the Last Year: Never true  Transportation Needs: No Transportation Needs  . Lack of Transportation (Medical): No  . Lack of Transportation (Non-Medical): No  Physical Activity: Inactive  . Days of Exercise per Week: 0 days  . Minutes of Exercise per Session: 0 min  Stress: No Stress Concern Present  . Feeling of Stress : Not at all  Social Connections:  Not on file    Tobacco Counseling Counseling given: Not Answered   Clinical Intake:  Pre-visit preparation completed: Yes  Pain : 0-10 Pain Type: Chronic pain Pain Location: Shoulder Pain Orientation: Left,Right Pain Descriptors / Indicators: Aching Pain Onset: More than a month ago Pain Frequency: Intermittent     Nutritional Risks: None Diabetes: No  How often do you need to have someone help you when you read instructions, pamphlets, or other written materials  from your doctor or pharmacy?: 1 - Never  Diabetic: No Nutrition Risk Assessment:  Has the patient had any N/V/D within the last 2 months?  No  Does the patient have any non-healing wounds?  No  Has the patient had any unintentional weight loss or weight gain?  No   Diabetes:  Is the patient diabetic?  No  If diabetic, was a CBG obtained today?  N/A Did the patient bring in their glucometer from home?  N/A How often do you monitor your CBG's? N/A.   Financial Strains and Diabetes Management:  Are you having any financial strains with the device, your supplies or your medication? N/A.  Does the patient want to be seen by Chronic Care Management for management of their diabetes?  N/A Would the patient like to be referred to a Nutritionist or for Diabetic Management?  N/A  Interpreter Needed?: No  Information entered by :: CJohnson, LPN   Activities of Daily Living In your present state of health, do you have any difficulty performing the following activities: 10/11/2020  Hearing? N  Vision? N  Difficulty concentrating or making decisions? N  Walking or climbing stairs? N  Dressing or bathing? N  Doing errands, shopping? N  Preparing Food and eating ? N  Using the Toilet? N  In the past six months, have you accidently leaked urine? N  Do you have problems with loss of bowel control? N  Managing your Medications? N  Managing your Finances? N  Housekeeping or managing your Housekeeping? N  Some  recent data might be hidden    Patient Care Team: Jinny Sanders, MD as PCP - General (Family Medicine) Zehr, Laban Emperor, PA-C as Physician Assistant (Gastroenterology) Mauri Pole, MD as Consulting Physician (Gastroenterology)  Indicate any recent Medical Services you may have received from other than [facility] providers in the past year (date may be approximate).     Assessment:   This is a routine wellness examination for Heather Potter.  Hearing/Vision screen  Hearing Screening   125Hz  250Hz  500Hz  1000Hz  2000Hz  3000Hz  4000Hz  6000Hz  8000Hz   Right ear:           Left ear:           Vision Screening Comments: Patient gets annual eye exams   Dietary issues and exercise activities discussed: Current Exercise Habits: The patient does not participate in regular exercise at present, Exercise limited by: None identified  Goals Addressed            This Visit's Progress   . Patient Stated       10/11/2020,  I will maintain and continue medications as prescribed.       Depression Screen PHQ 2/9 Scores 10/11/2020 04/17/2020 10/11/2019 01/11/2019 09/07/2018 08/26/2017 08/23/2016  PHQ - 2 Score 0 0 0 0 0 2 3  PHQ- 9 Score 0 - 0 - 0 4 4    Fall Risk Fall Risk  10/11/2020 04/17/2020 10/11/2019 01/11/2019 12/18/2018  Falls in the past year? 0 1 1 1 1   Comment - - tripped over somethings - Emmi Telephone Survey: data to providers prior to load  Number falls in past yr: 0 0 1 1 1   Comment - - - fell getting out of the tub and fell outside [employer] Actual Response = 1  Injury with Fall? 0 0 0 0 0  Risk for fall due to : Medication side effect;Impaired balance/gait - Medication side effect - -  Follow up Falls evaluation  completed;Falls prevention discussed Falls evaluation completed Falls evaluation completed;Falls prevention discussed - -    FALL RISK PREVENTION PERTAINING TO THE HOME:  Any stairs in or around the home? Yes  If so, are there any without handrails? No  Home free of  loose throw rugs in walkways, pet beds, electrical cords, etc? Yes  Adequate lighting in your home to reduce risk of falls? Yes   ASSISTIVE DEVICES UTILIZED TO PREVENT FALLS:  Life alert? No  Use of a cane, walker or w/c? No  Grab bars in the bathroom? No  Shower chair or bench in shower? No  Elevated toilet seat or a handicapped toilet? No   TIMED UP AND GO:  Was the test performed? N/A telephone visit .   Cognitive Function: MMSE - Mini Mental State Exam 10/11/2020 10/11/2019 09/07/2018 08/26/2017 08/23/2016  Orientation to time 5 5 5 5 5   Orientation to Place 5 5 5 5 5   Registration 3 3 3 3 3   Attention/ Calculation 5 5 0 0 0  Recall 3 3 3 3 3   Language- name 2 objects - - 0 0 0  Language- repeat 1 1 1 1 1   Language- follow 3 step command - - 0 3 3  Language- read & follow direction - - 0 0 0  Write a sentence - - 0 0 0  Copy design - - 0 0 0  Total score - - 17 20 20   Mini Cog  Mini-Cog screen was completed. Maximum score is 22. A value of 0 denotes this part of the MMSE was not completed or the patient failed this part of the Mini-Cog screening.       Immunizations Immunization History  Administered Date(s) Administered  . Fluad Quad(high Dose 65+) 01/11/2019  . Influenza Whole 03/10/2013  . Influenza, High Dose Seasonal PF 02/27/2017, 02/14/2018, 02/17/2020  . Influenza,inj,Quad PF,6+ Mos 02/16/2014, 02/06/2016  . Influenza-Unspecified 03/14/2015, 12/20/2019  . PFIZER(Purple Top)SARS-COV-2 Vaccination 07/03/2019, 07/24/2019, 02/25/2020  . Pneumococcal Conjugate-13 08/18/2013  . Pneumococcal Polysaccharide-23 11/28/2010  . Zoster 11/28/2010  . Zoster Recombinat (Shingrix) 12/20/2019, 03/09/2020    TDAP status: Up to date  Flu Vaccine status: Up to date  Pneumococcal vaccine status: Up to date  Covid-19 vaccine status: completed 3 doses, booster due. Patient aware.   Qualifies for Shingles Vaccine? Yes   Zostavax completed Yes   Shingrix Completed?:  Yes  Screening Tests Health Maintenance  Topic Date Due  . TETANUS/TDAP  11/27/2020  . INFLUENZA VACCINE  12/18/2020  . COLONOSCOPY (Pts 45-44yrs Insurance coverage will need to be confirmed)  12/18/2020  . DEXA SCAN  Completed  . COVID-19 Vaccine  Completed  . Hepatitis C Screening  Completed  . PNA vac Low Risk Adult  Completed  . HPV VACCINES  Aged Out    Health Maintenance  There are no preventive care reminders to display for this patient.  Colorectal cancer screening: Type of screening: Colonoscopy. Completed 12/19/2010. Repeat every 10 years  Mammogram status: Completed 02/17/2020. Repeat every year  Bone Density status: Completed 01/14/2019. Results reflect: Bone density results: OSTEOPENIA. Repeat every 2 years.  Lung Cancer Screening: (Low Dose CT Chest recommended if Age 17-80 years, 30 pack-year currently smoking OR have quit w/in 15years.) does not qualify.    Additional Screening:  Hepatitis C Screening: does qualify; Completed 07/06/2015  Vision Screening: Recommended annual ophthalmology exams for early detection of glaucoma and other disorders of the eye. Is the patient up to date with their annual  eye exam?  Yes  Who is the provider or what is the name of the office in which the patient attends annual eye exams? [facility] If pt is not established with a provider, would they like to be referred to a provider to establish care? No .   Dental Screening: Recommended annual dental exams for proper oral hygiene  Community Resource Referral / Chronic Care Management: CRR required this visit?  No   CCM required this visit?  No      Plan:     I have personally reviewed and noted the following in the patient's chart:   . Medical and social history . Use of alcohol, tobacco or illicit drugs  . Current medications and supplements including opioid prescriptions.  . Functional ability and status . Nutritional status . Physical activity . Advanced  directives . List of other physicians . Hospitalizations, surgeries, and ER visits in previous 12 months . Vitals . Screenings to include cognitive, depression, and falls . Referrals and appointments  In addition, I have reviewed and discussed with patient certain preventive protocols, quality metrics, and best practice recommendations. A written personalized care plan for preventive services as well as general preventive health recommendations were provided to patient.   Due to this being a telephonic visit, the after visit summary with patients personalized plan was offered to patient via office or my-chart. Patient preferred to pick up at office at next visit or via mychart.   Andrez Grime, LPN   09/05/6220

## 2020-10-11 NOTE — Patient Instructions (Signed)
[name] , Thank you for taking time to come for your Medicare Wellness Visit. I appreciate your ongoing commitment to your health goals. Please review the following plan we discussed and let me know if I can assist you in the future.   Screening recommendations/referrals: Colonoscopy: Up to date, completed 12/19/2010, due 12/2020 Mammogram: Up to date, completed 02/17/2020, due 01/2021 Bone Density: Up to date, completed 01/14/2019, due 12/2020 Recommended yearly ophthalmology/optometry visit for glaucoma screening and checkup Recommended yearly dental visit for hygiene and checkup  Vaccinations: Influenza vaccine: Up to date, completed 02/17/2020, due 12/2020 Pneumococcal vaccine: Completed series Tdap vaccine: Up to date, completed 11/28/2010, due 11/2020 Shingles vaccine: Completed series   Covid-19:3 doses completed, booster #2 due. Please get at earliest convenience   Advanced directives: Advance directive discussed with you today. Even though you declined this today please call our office should you change your mind and we can give you the proper paperwork for you to fill out.   Conditions/risks identified: hyperlipidemia   Next appointment: Follow up in one year for your annual wellness visit    Preventive Care 65 Years and Older, Female Preventive care refers to lifestyle choices and visits with your health care provider that can promote health and wellness. What does preventive care include?  A yearly physical exam. This is also called an annual well check.  Dental exams once or twice a year.  Routine eye exams. Ask your health care provider how often you should have your eyes checked.  Personal lifestyle choices, including:  Daily care of your teeth and gums.  Regular physical activity.  Eating a healthy diet.  Avoiding tobacco and drug use.  Limiting alcohol use.  Practicing safe sex.  Taking low-dose aspirin every day.  Taking vitamin and mineral supplements as  recommended by your health care provider. What happens during an annual well check? The services and screenings done by your health care provider during your annual well check will depend on your age, overall health, lifestyle risk factors, and family history of disease. Counseling  Your health care provider may ask you questions about your:  Alcohol use.  Tobacco use.  Drug use.  Emotional well-being.  Home and relationship well-being.  Sexual activity.  Eating habits.  History of falls.  Memory and ability to understand (cognition).  Work and work Statistician.  Reproductive health. Screening  You may have the following tests or measurements:  Height, weight, and BMI.  Blood pressure.  Lipid and cholesterol levels. These may be checked every 5 years, or more frequently if you are over 25 years old.  Skin check.  Lung cancer screening. You may have this screening every year starting at age 12 if you have a 30-pack-year history of smoking and currently smoke or have quit within the past 15 years.  Fecal occult blood test (FOBT) of the stool. You may have this test every year starting at age 72.  Flexible sigmoidoscopy or colonoscopy. You may have a sigmoidoscopy every 5 years or a colonoscopy every 10 years starting at age 21.  Hepatitis C blood test.  Hepatitis B blood test.  Sexually transmitted disease (STD) testing.  Diabetes screening. This is done by checking your blood sugar (glucose) after you have not eaten for a while (fasting). You may have this done every 1-3 years.  Bone density scan. This is done to screen for osteoporosis. You may have this done starting at age 74.  Mammogram. This may be done every 1-2 years.  Talk to your health care provider about how often you should have regular mammograms. Talk with your health care provider about your test results, treatment options, and if necessary, the need for more tests. Vaccines  Your health care  provider may recommend certain vaccines, such as:  Influenza vaccine. This is recommended every year.  Tetanus, diphtheria, and acellular pertussis (Tdap, Td) vaccine. You may need a Td booster every 10 years.  Zoster vaccine. You may need this after age 23.  Pneumococcal 13-valent conjugate (PCV13) vaccine. One dose is recommended after age 90.  Pneumococcal polysaccharide (PPSV23) vaccine. One dose is recommended after age 50. Talk to your health care provider about which screenings and vaccines you need and how often you need them. This information is not intended to replace advice given to you by your health care provider. Make sure you discuss any questions you have with your health care provider. Document Released: 06/02/2015 Document Revised: 01/24/2016 Document Reviewed: 03/07/2015 Elsevier Interactive Patient Education  2017 [place] Prevention in the Home Falls can cause injuries. They can happen to people of all ages. There are many things you can do to make your home safe and to help prevent falls. What can I do on the outside of my home?  Regularly fix the edges of walkways and driveways and fix any cracks.  Remove anything that might make you trip as you walk through a door, such as a raised step or threshold.  Trim any bushes or trees on the path to your home.  Use bright outdoor lighting.  Clear any walking paths of anything that might make someone trip, such as rocks or tools.  Regularly check to see if handrails are loose or broken. Make sure that both sides of any steps have handrails.  Any raised decks and porches should have guardrails on the edges.  Have any leaves, snow, or ice cleared regularly.  Use sand or salt on walking paths during winter.  Clean up any spills in your garage right away. This includes oil or grease spills. What can I do in the bathroom?  Use night lights.  Install grab bars by the toilet and in the tub and shower. Do  not use towel bars as grab bars.  Use non-skid mats or decals in the tub or shower.  If you need to sit down in the shower, use a plastic, non-slip stool.  Keep the floor dry. Clean up any water that spills on the floor as soon as it happens.  Remove soap buildup in the tub or shower regularly.  Attach bath mats securely with double-sided non-slip rug tape.  Do not have throw rugs and other things on the floor that can make you trip. What can I do in the bedroom?  Use night lights.  Make sure that you have a light by your bed that is easy to reach.  Do not use any sheets or blankets that are too big for your bed. They should not hang down onto the floor.  Have a firm chair that has side arms. You can use this for support while you get dressed.  Do not have throw rugs and other things on the floor that can make you trip. What can I do in the kitchen?  Clean up any spills right away.  Avoid walking on wet floors.  Keep items that you use a lot in easy-to-reach places.  If you need to reach something above you, use a strong step stool  that has a grab bar.  Keep electrical cords out of the way.  Do not use floor polish or wax that makes floors slippery. If you must use wax, use non-skid floor wax.  Do not have throw rugs and other things on the floor that can make you trip. What can I do with my stairs?  Do not leave any items on the stairs.  Make sure that there are handrails on both sides of the stairs and use them. Fix handrails that are broken or loose. Make sure that handrails are as long as the stairways.  Check any carpeting to make sure that it is firmly attached to the stairs. Fix any carpet that is loose or worn.  Avoid having throw rugs at the top or bottom of the stairs. If you do have throw rugs, attach them to the floor with carpet tape.  Make sure that you have a light switch at the top of the stairs and the bottom of the stairs. If you do not have them,  ask someone to add them for you. What else can I do to help prevent falls?  Wear shoes that:  Do not have high heels.  Have rubber bottoms.  Are comfortable and fit you well.  Are closed at the toe. Do not wear sandals.  If you use a stepladder:  Make sure that it is fully opened. Do not climb a closed stepladder.  Make sure that both sides of the stepladder are locked into place.  Ask someone to hold it for you, if possible.  Clearly mark and make sure that you can see:  Any grab bars or handrails.  First and last steps.  Where the edge of each step is.  Use tools that help you move around (mobility aids) if they are needed. These include:  Canes.  Walkers.  Scooters.  Crutches.  Turn on the lights when you go into a dark area. Replace any light bulbs as soon as they burn out.  Set up your furniture so you have a clear path. Avoid moving your furniture around.  If any of your floors are uneven, fix them.  If there are any pets around you, be aware of where they are.  Review your medicines with your doctor. Some medicines can make you feel dizzy. This can increase your chance of falling. Ask your doctor what other things that you can do to help prevent falls. This information is not intended to replace advice given to you by your health care provider. Make sure you discuss any questions you have with your health care provider. Document Released: 03/02/2009 Document Revised: 10/12/2015 Document Reviewed: 06/10/2014 Elsevier Interactive Patient Education  2017 [employer].

## 2020-10-11 NOTE — Progress Notes (Signed)
PCP notes:  Health Maintenance: Covid booster- due    Abnormal Screenings: none   Patient concerns: Balance issues    Nurse concerns: none   Next PCP appt.: 04/20/2021 @ 9:40 am

## 2020-10-31 ENCOUNTER — Other Ambulatory Visit: Payer: Self-pay | Admitting: Family Medicine

## 2020-10-31 DIAGNOSIS — M19019 Primary osteoarthritis, unspecified shoulder: Secondary | ICD-10-CM

## 2020-10-31 NOTE — Telephone Encounter (Signed)
Last office visit 06/20/2020 for Establish Care. Last refilled 10/10/2020 for #60 with no refills.  CPE scheduled 04/20/2021.

## 2020-11-15 DIAGNOSIS — Z79899 Other long term (current) drug therapy: Secondary | ICD-10-CM | POA: Diagnosis not present

## 2020-11-15 DIAGNOSIS — M159 Polyosteoarthritis, unspecified: Secondary | ICD-10-CM | POA: Diagnosis not present

## 2020-11-15 DIAGNOSIS — M0609 Rheumatoid arthritis without rheumatoid factor, multiple sites: Secondary | ICD-10-CM | POA: Diagnosis not present

## 2020-11-15 DIAGNOSIS — M353 Polymyalgia rheumatica: Secondary | ICD-10-CM | POA: Diagnosis not present

## 2020-11-22 ENCOUNTER — Other Ambulatory Visit: Payer: Self-pay | Admitting: Family Medicine

## 2020-11-22 DIAGNOSIS — I10 Essential (primary) hypertension: Secondary | ICD-10-CM

## 2020-12-06 ENCOUNTER — Other Ambulatory Visit: Payer: Self-pay | Admitting: Family Medicine

## 2020-12-06 DIAGNOSIS — M19019 Primary osteoarthritis, unspecified shoulder: Secondary | ICD-10-CM

## 2020-12-06 NOTE — Telephone Encounter (Signed)
Last office visit 06/20/2020 for Establish Care.  Last refilled 10/31/2020 for #60 with no refills.  CPE scheduled for 04/20/2021.

## 2020-12-09 ENCOUNTER — Encounter: Payer: Self-pay | Admitting: Gastroenterology

## 2021-01-01 ENCOUNTER — Ambulatory Visit
Admission: EM | Admit: 2021-01-01 | Discharge: 2021-01-01 | Disposition: A | Discharge status: Home / Self Care | Payer: PPO | Attending: Physician Assistant | Admitting: Physician Assistant

## 2021-01-01 ENCOUNTER — Other Ambulatory Visit: Payer: Self-pay

## 2021-01-01 ENCOUNTER — Telehealth: Payer: Self-pay | Admitting: *Deleted

## 2021-01-01 ENCOUNTER — Ambulatory Visit (INDEPENDENT_AMBULATORY_CARE_PROVIDER_SITE_OTHER): Payer: PPO

## 2021-01-01 DIAGNOSIS — R0602 Shortness of breath: Secondary | ICD-10-CM | POA: Diagnosis not present

## 2021-01-01 DIAGNOSIS — R059 Cough, unspecified: Secondary | ICD-10-CM | POA: Diagnosis not present

## 2021-01-01 DIAGNOSIS — D849 Immunodeficiency, unspecified: Secondary | ICD-10-CM | POA: Diagnosis not present

## 2021-01-01 DIAGNOSIS — J189 Pneumonia, unspecified organism: Secondary | ICD-10-CM

## 2021-01-01 IMAGING — CR DG CHEST 2V
2 series · 2 of 2 positions shown · non-contrast
Comparison: None.

CLINICAL DATA: Cough and SOB.

EXAM:
CHEST - 2 VIEW

[chest pa]
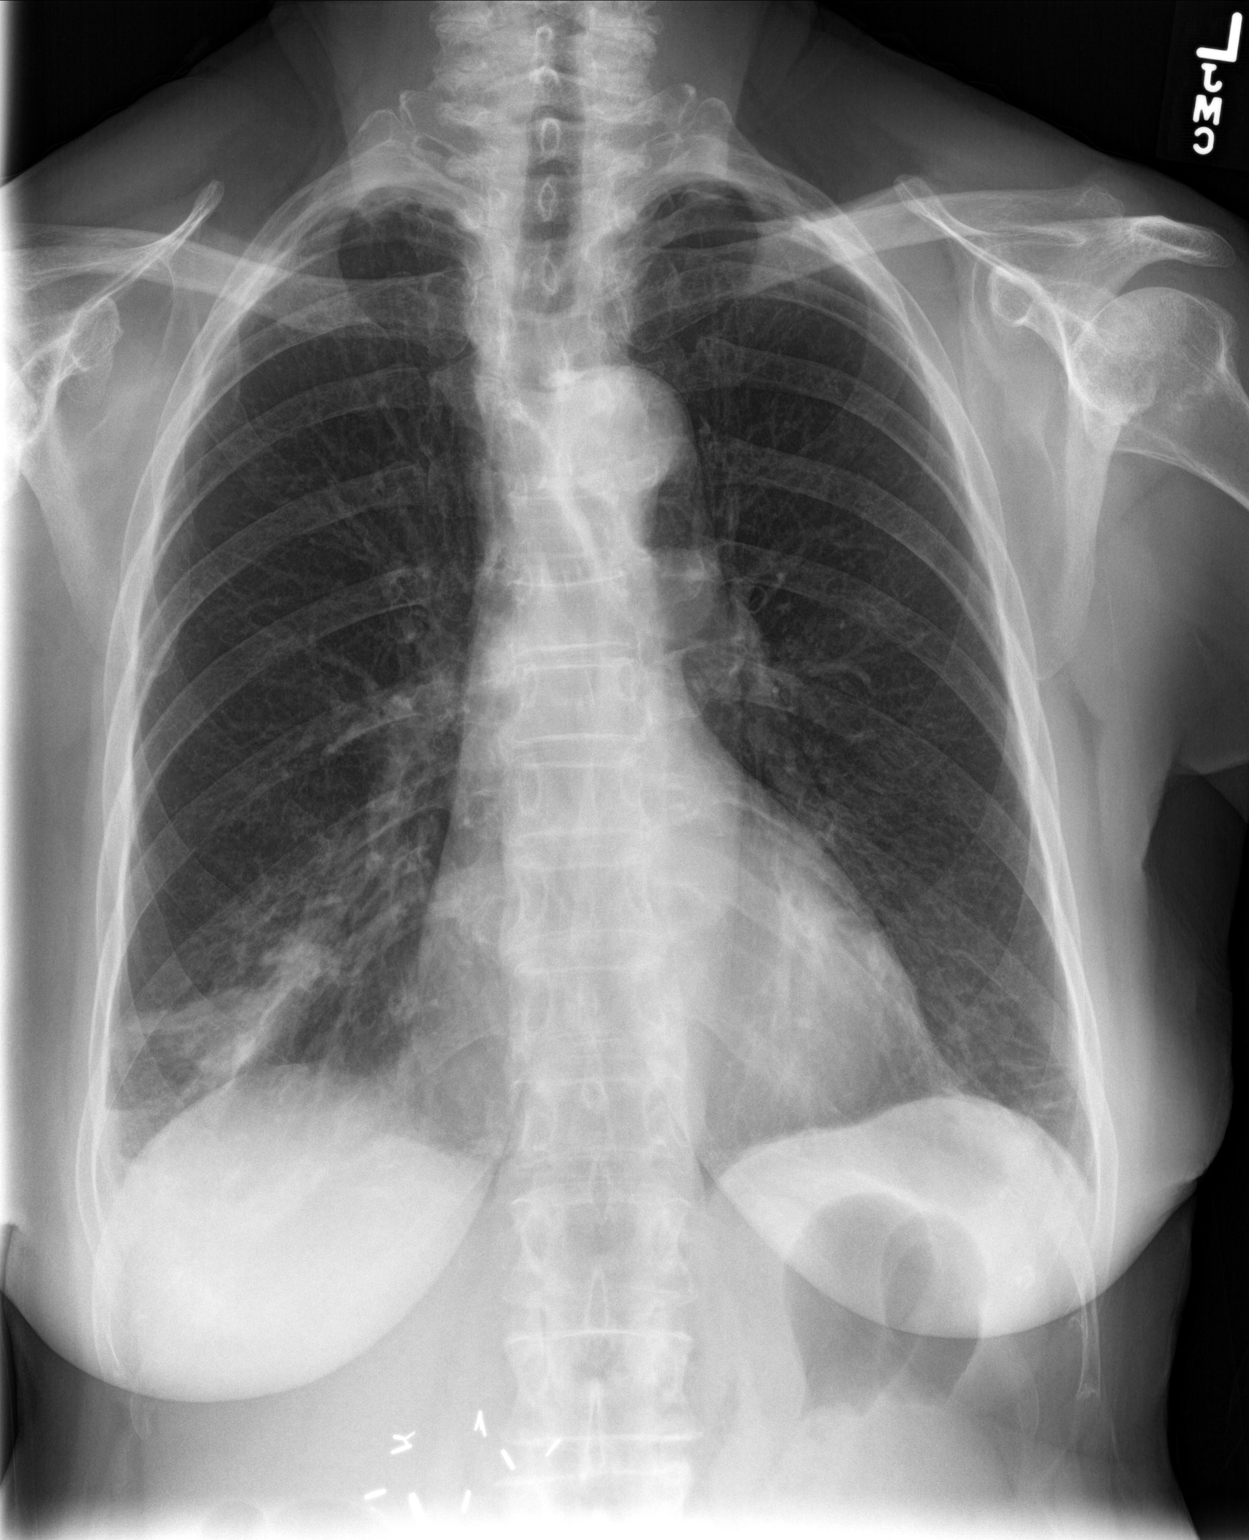

[chest lat]
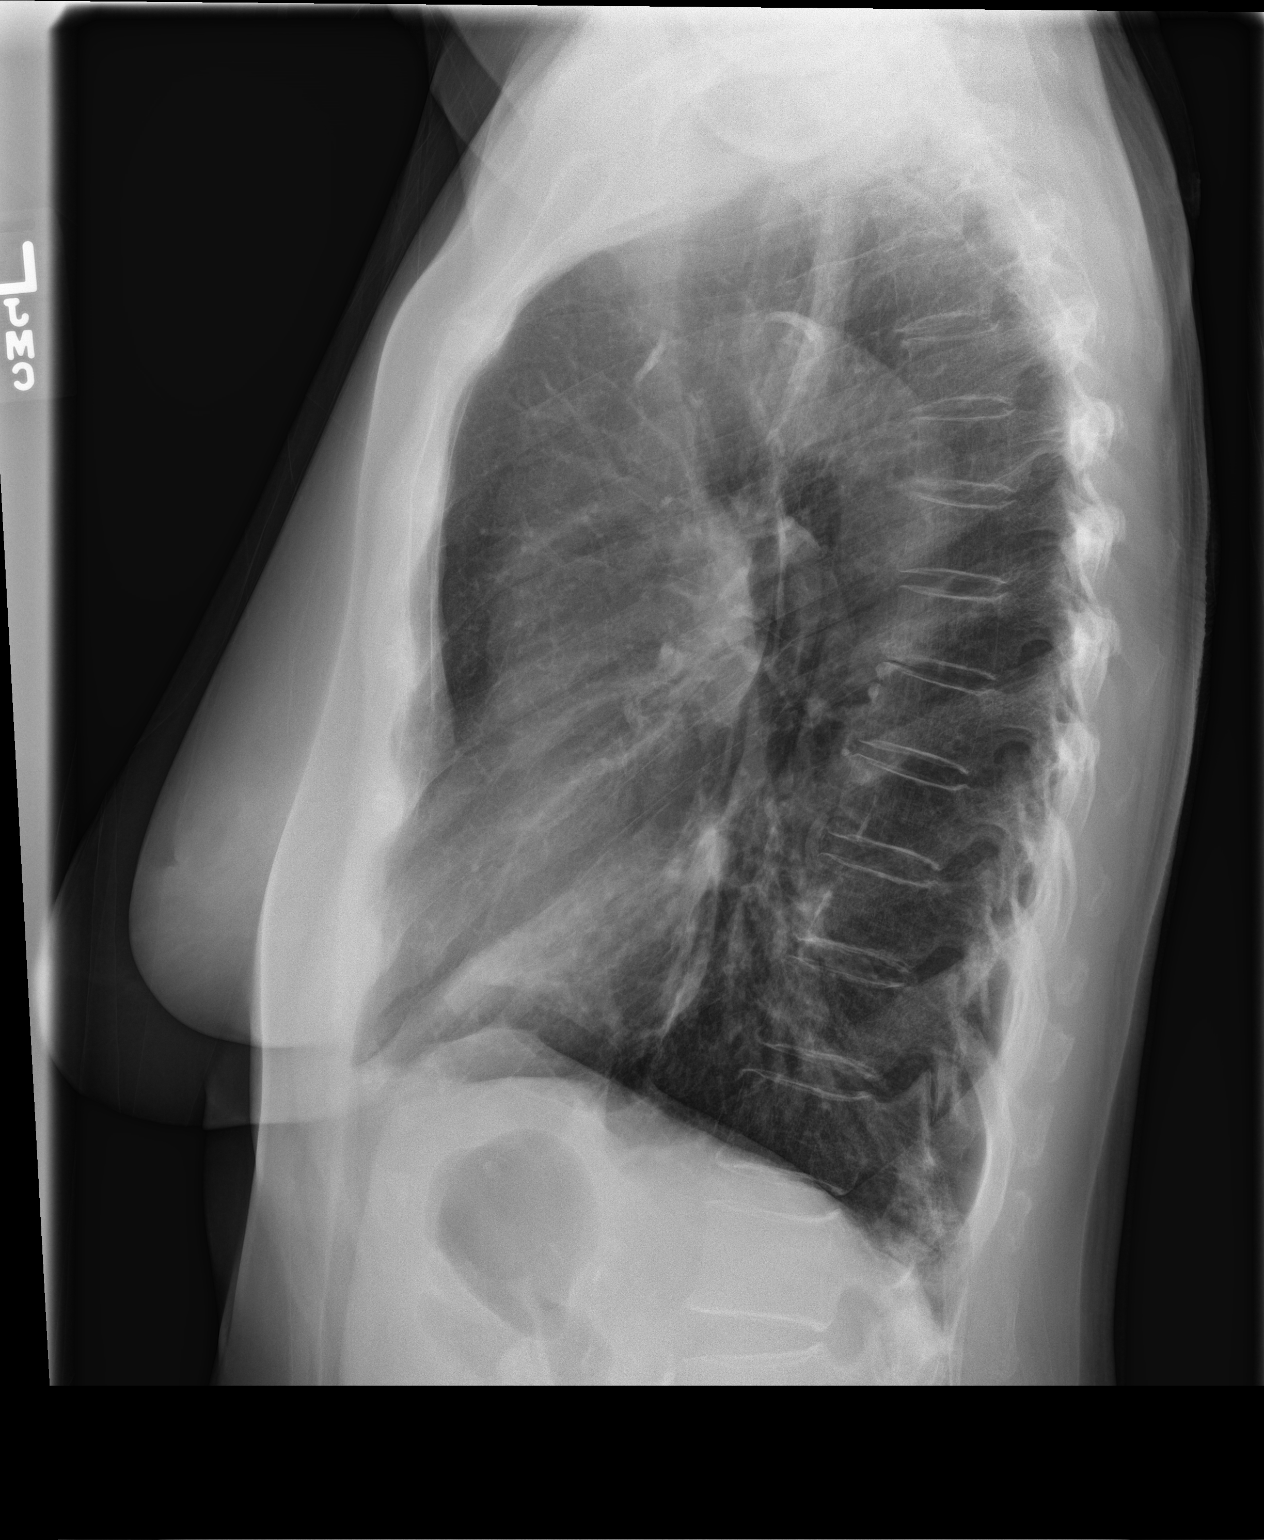

[2 of 2 positions shown; findings below may reference images not displayed]

FINDINGS: The normal heart size. No pleural effusion or interstitial edema.
Airspace disease is identified within the right middle lobe
compatible with pneumonia. The visualized osseous structures are
unremarkable.
IMPRESSION: Right middle lobe pneumonia. Followup PA and lateral chest X-ray is
recommended in 3-4 weeks following trial of antibiotic therapy to
ensure resolution and exclude underlying malignancy.

## 2021-01-01 MED ORDER — AZITHROMYCIN 250 MG PO TABS: ORAL_TABLET | ORAL | 0 refills | Status: DC

## 2021-01-01 MED ORDER — AMOXICILLIN-POT CLAVULANATE 875-125 MG PO TABS: 1.0000 | ORAL_TABLET | Freq: Two times a day (BID) | ORAL | 0 refills | Status: AC

## 2021-01-01 NOTE — Telephone Encounter (Signed)
Noted  

## 2021-01-01 NOTE — Telephone Encounter (Signed)
Patient's husband left a voicemail stating that his wife has been sick with a cold for about 10 days. Called patient's husband back and was advised that they are at [facility] now and there is a 2 hour wait. Patient stated that she has a horrible cough and can not sleep at night. Patient denies SOB unless she gets a real bad coughing spell. Patient was given information on the [facility]. Patient stated that she does not feel that she needs to go to the ER. Patient's husband stated that he appreciated the call  back and will head to the [facility] now.

## 2021-01-01 NOTE — Discharge Instructions (Addendum)
-  Augmentin: 1 tablet twice a day for 7 days -Azithromycin: Follow directions on the package.  2 tablets first day then 1 tablet each day after until gone -Continue over-the-counter medications for symptoms -Call to schedule appoint with your primary care physician for 3 weeks.  This will follow-up for your symptoms as well as a repeat chest x-ray to evaluate for clearance. -Return to clinic or report to ER should symptoms worsen or develop worsening shortness of breath.

## 2021-01-01 NOTE — ED Triage Notes (Signed)
Pt presents with cough for past 10 days, pt states she tested negative for covid twice

## 2021-01-01 NOTE — ED Provider Notes (Signed)
[facility]    CSN: [id number] Arrival date & time: 01/01/21  1049      History   Chief Complaint Chief Complaint  Patient presents with   Cough    HPI Heather Potter is a 75 y.o. female.   Patient is a 75 year old female who presents with chief complaint of cough that has been ongoing for 10 days.  Patient reports chest congestion but she is unable to cough anything up.  She does report some chest pain associated with her coughing but denies any fever.  She is been taking over-the-counter dextromethorphan cough medicine and then NyQuil at night to help for sleep.  Patient denies any nausea or dizziness.  Patient is taking prednisone and methotrexate for her rheumatoid arthritis but but denies any biologic medications.  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  Patient reports smoking 1.5-2 pack/day from the age of 2 until 2009.   Past Medical History:  Diagnosis Date   Colitis    History of blood transfusion    D and C and miscarriage   History of gynecologic surgery    had right ovary removed, unsure about uterus/cervix   Hyperlipidemia    Hypertension    Hypothyroidism    Lichen planus [id number]   Treated by Dr. Kellie Moor (derm) with methotrexate   Osteoarthritis    Osteopenia     Patient Active Problem List   Diagnosis Date Noted   Current chronic use of systemic steroids 04/07/2019   H/O total hip arthroplasty 03/03/2019   Avascular necrosis of bone of right hip ([place]) 11/11/2018   Generalized osteoarthritis of hand 11/11/2018   PMR (polymyalgia rheumatica) ([place]) 0000000   Lichen planus [id number]   Osteopenia    Hypothyroidism 10/27/2009   HLD (hyperlipidemia) 10/27/2009   Essential hypertension 10/27/2009   Osteoarthritis 10/27/2009   NEPHRECTOMY, HX OF 10/27/2009    Past Surgical History:  Procedure Laterality Date    [place]   colectomy for ? 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   OOPHORECTOMY     right   OTHER SURGICAL HISTORY     part of intestine removed   TOTAL HIP ARTHROPLASTY Right 03/03/2019   Procedure: TOTAL HIP ARTHROPLASTY;  Surgeon: Dereck Leep, MD;  Location: [facility];  Service: Orthopedics;  Laterality: Right;   TUBAL LIGATION     bilat    OB History   No obstetric history on file.      Home Medications    Prior to Admission medications   Medication Sig Start Date End Date Taking? Authorizing Provider  amoxicillin-clavulanate (AUGMENTIN) 875-125 MG tablet Take 1 tablet by mouth every 12 (twelve) hours. 01/01/21  Yes Luvenia Redden, PA-C  azithromycin (ZITHROMAX Z-PAK) 250 MG tablet As described on packaging. Two tablets first day then one each day after until gone 01/01/21  Yes Luvenia Redden, PA-C  acetaminophen (TYLENOL) 650 MG CR tablet Take 650 mg by mouth every 8 (eight) hours as needed for pain.    [provider]  amLODipine (NORVASC) 10 MG tablet Take 1 tablet (10 mg total) by mouth daily. 08/14/20   Bedsole, Amy E, MD  aspirin EC 81 MG tablet Take 81 mg by mouth daily. Swallow whole.    [provider]  Calcium Carbonate-Vit D-Min (CALCIUM 1200 PO) Take 600 mg by mouth 2 (two) times daily.     [provider]  cholecalciferol (VITAMIN D) 400 units TABS tablet Take 400 Units by mouth at bedtime.     [provider]  diclofenac Sodium (VOLTAREN) 1 % GEL Apply 2 g topically 4 (four) times daily. 06/13/20   [provider]  folic acid (FOLVITE) 1 MG tablet Take 1 mg by mouth daily. 05/27/20   [provider]   gabapentin (NEURONTIN) 100 MG capsule Take 1 capsule by mouth twice daily 10/02/20   Diona Browner, Amy E, MD  hydrochlorothiazide (HYDRODIURIL) 25 MG tablet Take 1 tablet by mouth once daily 11/24/20   Bedsole, Amy E, MD  levothyroxine (SYNTHROID) 88 MCG tablet Take 1 tablet (88 mcg total) by mouth daily. 06/26/20   Bedsole, Amy E, MD  methotrexate 2.5 MG tablet TAKE 6 TABLETS BY MOUTH ONCE A WEEK (ALL ON THE SAME DAY) 05/31/20   [provider]  predniSONE (DELTASONE) 2.5 MG tablet Take by mouth. Every other day 05/08/20   [provider]  simvastatin (ZOCOR) 20 MG tablet TAKE 1/2 (ONE-HALF) TABLET BY MOUTH ONCE DAILY AT  6PM  -  DUE  FOR  PHYSICAL  AUGUST  2021 08/07/20   Bedsole, Amy E, MD  traMADol (ULTRAM) 50 MG tablet TAKE 1 TABLET BY MOUTH EVERY 12 HOURS AS NEEDED FOR SEVERE PAIN 12/06/20   Bedsole, Amy E, MD  TURMERIC PO Take 1 capsule by mouth daily.    [provider]    Family History Family History  Problem Relation Age of Onset   Pancreatic cancer Mother    Heart attack Father    Heart attack Brother    Liver cancer Brother    Asthma Brother    Colon cancer Neg Hx    Breast cancer Neg Hx     Social History Social History   Tobacco Use   Smoking status: Former    Packs/day: 1.00    Years: 40.00    Pack years: 40.00    Types: Cigarettes    Quit date: 05/21/2007    Years since quitting: 13.6   Smokeless tobacco: Never  Vaping Use   Vaping Use: Never used  Substance Use Topics   Alcohol use: Yes    Comment: rare- wine   Drug use: No     Allergies   Patient has no known allergies.   Review of Systems Review of Systems as above in HPI.  Other systems reviewed and found be negative   Physical Exam Triage Vital Signs ED Triage Vitals  Enc Vitals Group     BP 01/01/21 1124 137/78     Pulse Rate 01/01/21 1124 75     Resp 01/01/21 1124 20     Temp 01/01/21 1124 98.1 F (36.7 C)     Temp src --      SpO2 01/01/21 1124 93 %     Weight --       Height --      Head Circumference --      Peak Flow --      Pain Score 01/01/21 1121 0     Pain Loc --  Pain Edu? --      Excl. in [place]? --    No data found.  Updated Vital Signs BP 137/78   Pulse 75   Temp 98.1 F (36.7 C)   Resp 20   SpO2 93%    Physical Exam Constitutional:      Appearance: Normal appearance. She is ill-appearing.  HENT:     Head: Normocephalic and atraumatic.     Nose: Nose normal. No congestion or rhinorrhea.     Mouth/Throat:     Mouth: Mucous membranes are moist.  Eyes:     Pupils: Pupils are equal, round, and reactive to light.  Cardiovascular:     Rate and Rhythm: Normal rate and regular rhythm.     Heart sounds: Normal heart sounds.  Pulmonary:     Effort: Pulmonary effort is normal.     Breath sounds: Rhonchi and rales present.  Skin:    General: Skin is warm and dry.     Capillary Refill: Capillary refill takes less than 2 seconds.  Neurological:     General: No focal deficit present.     Mental Status: She is alert and oriented to person, place, and time.  Psychiatric:        Mood and Affect: Mood normal.        Behavior: Behavior normal.     UC Treatments / Results  Labs (all labs ordered are listed, but only abnormal results are displayed) Labs Reviewed - No data to display  EKG   Radiology DG Chest 2 View  Result Date: 01/01/2021 CLINICAL DATA:  Cough and SOB. EXAM: CHEST - 2 VIEW COMPARISON:  None. FINDINGS: The normal heart size. No pleural effusion or interstitial edema. Airspace disease is identified within the right middle lobe compatible with pneumonia. The visualized osseous structures are unremarkable. IMPRESSION: Right middle lobe pneumonia. Followup PA and lateral chest X-ray is recommended in 3-4 weeks following trial of antibiotic therapy to ensure resolution and exclude underlying malignancy. Electronically Signed   By: Kerby Moors M.D.   On: 01/01/2021 12:11    Procedures Procedures (including critical  care time)  Medications Ordered in UC Medications - No data to display  Initial Impression / Assessment and Plan / UC Course  I have reviewed the triage vital signs and the nursing notes.  Pertinent labs & imaging results that were available during my care of the patient were reviewed by me and considered in my medical decision making (see chart for details).    Patient presents with cough x10 days and has been nonproductive.  60-80-pack-year history of smoking, quit 2009.  Multiple COVID tests at home that have been negative.  Patient is on chronic methotrexate and prednisone for her rheumatoid arthritis.  Chest x-ray with right lower lobe pneumonia.  We will treat her with Augmentin and azithromycin for community-acquired pneumonia in the setting of her immunosuppression.  Have her follow-up with her primary care provider in 3 weeks to follow-up for this appointment as well as obtain a repeat chest x-ray.  Final Clinical Impressions(s) / UC Diagnoses   Final diagnoses:  Cough  Community acquired pneumonia of right lung, unspecified part of lung  Immunosuppression ([place])     Discharge Instructions      -Augmentin: 1 tablet twice a day for 7 days -Azithromycin: Follow directions on the package.  2 tablets first day then 1 tablet each day after until gone -Continue over-the-counter medications for symptoms -Call to schedule appoint with your primary care  physician for 3 weeks.  This will follow-up for your symptoms as well as a repeat chest x-ray to evaluate for clearance. -Return to clinic or report to ER should symptoms worsen or develop worsening shortness of breath.      ED Prescriptions     Medication Sig Dispense Auth. Provider   amoxicillin-clavulanate (AUGMENTIN) 875-125 MG tablet Take 1 tablet by mouth every 12 (twelve) hours. 14 tablet Denman George D, PA-C   azithromycin (ZITHROMAX Z-PAK) 250 MG tablet As described on packaging. Two tablets first day then one each  day after until gone 6 tablet Luvenia Redden, PA-C      PDMP not reviewed this encounter.   Luvenia Redden, PA-C 01/01/21 1231

## 2021-01-08 ENCOUNTER — Other Ambulatory Visit: Payer: Self-pay | Admitting: Family Medicine

## 2021-01-08 DIAGNOSIS — M19019 Primary osteoarthritis, unspecified shoulder: Secondary | ICD-10-CM

## 2021-01-08 NOTE — Telephone Encounter (Signed)
Last office visit 06/20/2020 for establish care.  Last refilled 12/06/2020 for #60 with no refills.  Next Appt: 01/23/2021 for 3 week follow up.

## 2021-01-23 ENCOUNTER — Ambulatory Visit (INDEPENDENT_AMBULATORY_CARE_PROVIDER_SITE_OTHER): Payer: PPO | Admitting: Family Medicine

## 2021-01-23 ENCOUNTER — Other Ambulatory Visit: Payer: Self-pay

## 2021-01-23 ENCOUNTER — Other Ambulatory Visit: Payer: Self-pay | Admitting: Family Medicine

## 2021-01-23 ENCOUNTER — Encounter: Payer: Self-pay | Admitting: Family Medicine

## 2021-01-23 VITALS — BP 122/66 | HR 67 | Temp 97.6°F | Ht 63.0 in | Wt 132.0 lb

## 2021-01-23 DIAGNOSIS — J189 Pneumonia, unspecified organism: Secondary | ICD-10-CM | POA: Diagnosis not present

## 2021-01-23 DIAGNOSIS — D849 Immunodeficiency, unspecified: Secondary | ICD-10-CM

## 2021-01-23 DIAGNOSIS — Z1231 Encounter for screening mammogram for malignant neoplasm of breast: Secondary | ICD-10-CM

## 2021-01-23 DIAGNOSIS — M069 Rheumatoid arthritis, unspecified: Secondary | ICD-10-CM | POA: Insufficient documentation

## 2021-01-23 NOTE — Progress Notes (Signed)
Patient ID: Heather Potter, female    DOB: 08/07/1945, 75 y.o.   MRN: [id number]  This visit was conducted in person.  BP 122/66   Pulse 67   Temp 97.6 F (36.4 C) (Temporal)   Ht '5\' 3"'$  (1.6 m)   Wt 132 lb (59.9 kg)   SpO2 96%   BMI 23.38 kg/m    CC:  Chief Complaint  Patient presents with   Follow-up    Subjective:   HPI: Heather Potter is a 75 y.o. female  former smoker with PMR, hypothyroid, HTN on chronic steroids presenting on 01/23/2021 for Follow-up  Immunocompromised on methotrexate.   ED visit on 01/01/2021  Chest x-ray with right lower lobe pneumonia.    Treated with Augmentin, Azithromycin   Since then she is feeling better, minimal cough.  No SOB, no wheeze.  No fever.  Improving energy overall.    Dr. Posey Pronto has increased her methotrexate and gabapentin.  Relevant past medical, surgical, family and social history reviewed and updated as indicated. Interim medical history since our last visit reviewed. Allergies and medications reviewed and updated. Outpatient Medications Prior to Visit  Medication Sig Dispense Refill   acetaminophen (TYLENOL) 650 MG CR tablet Take 650 mg by mouth every 8 (eight) hours as needed for pain.     amLODipine (NORVASC) 10 MG tablet Take 1 tablet (10 mg total) by mouth daily. 90 tablet 1   amoxicillin-clavulanate (AUGMENTIN) 875-125 MG tablet Take 1 tablet by mouth every 12 (twelve) hours. 14 tablet 0   aspirin EC 81 MG tablet Take 81 mg by mouth daily. Swallow whole.     azithromycin (ZITHROMAX Z-PAK) 250 MG tablet As described on packaging. Two tablets first day then one each day after until gone 6 tablet 0   Calcium Carbonate-Vit D-Min (CALCIUM 1200 PO) Take 600 mg by mouth 2 (two) times daily.      cholecalciferol (VITAMIN D) 400 units TABS tablet Take 400 Units by mouth at bedtime.      diclofenac Sodium (VOLTAREN) 1 % GEL Apply 2 g topically 4 (four) times daily.     folic acid (FOLVITE) 1 MG tablet Take 1 mg by mouth  daily.     gabapentin (NEURONTIN) 100 MG capsule Take 1 capsule by mouth twice daily 180 capsule 1   hydrochlorothiazide (HYDRODIURIL) 25 MG tablet Take 1 tablet by mouth once daily 90 tablet 1   levothyroxine (SYNTHROID) 88 MCG tablet Take 1 tablet (88 mcg total) by mouth daily. 90 tablet 3   methotrexate 2.5 MG tablet TAKE 6 TABLETS BY MOUTH ONCE A WEEK (ALL ON THE SAME DAY)     predniSONE (DELTASONE) 2.5 MG tablet Take by mouth. Every other day     simvastatin (ZOCOR) 20 MG tablet TAKE 1/2 (ONE-HALF) TABLET BY MOUTH ONCE DAILY AT  6PM  -  DUE  FOR  PHYSICAL  AUGUST  2021 90 tablet 0   traMADol (ULTRAM) 50 MG tablet TAKE 1 TABLET BY MOUTH EVERY 12 HOURS AS NEEDED FOR SEVERE PAIN 60 tablet 0   TURMERIC PO Take 1 capsule by mouth daily.     No facility-administered medications prior to visit.     Per HPI unless specifically indicated in ROS section below Review of Systems  Constitutional:  Negative for fatigue and fever.  HENT:  Negative for congestion.   Eyes:  Negative for pain.  Respiratory:  Negative for cough and shortness of breath.   Cardiovascular:  Negative for  chest pain, palpitations and leg swelling.  Gastrointestinal:  Negative for abdominal pain.  Genitourinary:  Negative for dysuria and vaginal bleeding.  Musculoskeletal:  Negative for back pain.  Neurological:  Negative for syncope, light-headedness and headaches.  Psychiatric/Behavioral:  Negative for dysphoric mood.   Objective:  BP 122/66   Pulse 67   Temp 97.6 F (36.4 C) (Temporal)   Ht '5\' 3"'$  (1.6 m)   Wt 132 lb (59.9 kg)   SpO2 96%   BMI 23.38 kg/m   Wt Readings from Last 3 Encounters:  01/23/21 132 lb (59.9 kg)  06/20/20 148 lb 8 oz (67.4 kg)  04/17/20 149 lb 1.3 oz (67.6 kg)      Physical Exam Constitutional:      General: She is not in acute distress.    Appearance: Normal appearance. She is well-developed. She is not ill-appearing or toxic-appearing.  HENT:     Head: Normocephalic.     Right  Ear: Hearing, tympanic membrane, ear canal and external ear normal. Tympanic membrane is not erythematous, retracted or bulging.     Left Ear: Hearing, tympanic membrane, ear canal and external ear normal. Tympanic membrane is not erythematous, retracted or bulging.     Nose: No mucosal edema or rhinorrhea.     Right Sinus: No maxillary sinus tenderness or frontal sinus tenderness.     Left Sinus: No maxillary sinus tenderness or frontal sinus tenderness.     Mouth/Throat:     Pharynx: Uvula midline.  Eyes:     General: Lids are normal. Lids are everted, no foreign bodies appreciated.     Conjunctiva/sclera: Conjunctivae normal.     Pupils: Pupils are equal, round, and reactive to light.  Neck:     Thyroid: No thyroid mass or thyromegaly.     Vascular: No carotid bruit.     Trachea: Trachea normal.  Cardiovascular:     Rate and Rhythm: Normal rate and regular rhythm.     Pulses: Normal pulses.     Heart sounds: Normal heart sounds, S1 normal and S2 normal. No murmur heard.   No friction rub. No gallop.  Pulmonary:     Effort: Pulmonary effort is normal. No tachypnea or respiratory distress.     Breath sounds: Normal breath sounds. No decreased breath sounds, wheezing, rhonchi or rales.  Abdominal:     General: Bowel sounds are normal.     Palpations: Abdomen is soft.     Tenderness: There is no abdominal tenderness.  Musculoskeletal:     Cervical back: Normal range of motion and neck supple.  Skin:    General: Skin is warm and dry.     Findings: No rash.  Neurological:     Mental Status: She is alert.  Psychiatric:        Mood and Affect: Mood is not anxious or depressed.        Speech: Speech normal.        Behavior: Behavior normal. Behavior is cooperative.        Thought Content: Thought content normal.        Judgment: Judgment normal.      Results for orders placed or performed in visit on 06/20/20  TSH  Result Value Ref Range   TSH 0.53 0.35 - 4.50 uIU/mL    This  visit occurred during the SARS-CoV-2 public health emergency.  Safety protocols were in place, including screening questions prior to the visit, additional usage of staff PPE, and extensive cleaning of exam  room while observing appropriate contact time as indicated for disinfecting solutions.   COVID 19 screen:  No recent travel or known exposure to COVID19 The patient denies respiratory symptoms of COVID 19 at this time. The importance of social distancing was discussed today.   Assessment and Plan    Problem List Items Addressed This Visit     Immunocompromised ([place])    High risk for infection.      Pneumonia of right lower lobe due to infectious organism - Primary     Improved symptoms and almost at baseline.   Re-eval with CXR to assure clearing.      Relevant Orders   DG Chest 2 View     Eliezer Lofts, MD

## 2021-01-23 NOTE — Patient Instructions (Signed)
In the next week have chest X-ray  done.

## 2021-01-23 NOTE — Assessment & Plan Note (Signed)
Improved symptoms and almost at baseline.   Re-eval with CXR to assure clearing.

## 2021-01-23 NOTE — Assessment & Plan Note (Signed)
High risk for infection.

## 2021-01-29 ENCOUNTER — Ambulatory Visit
Admission: RE | Admit: 2021-01-29 | Discharge: 2021-01-29 | Disposition: A | Discharge status: Home / Self Care | Payer: PPO | Source: Ambulatory Visit | Attending: Family Medicine | Admitting: Family Medicine

## 2021-01-29 DIAGNOSIS — J439 Emphysema, unspecified: Secondary | ICD-10-CM | POA: Diagnosis not present

## 2021-01-29 DIAGNOSIS — J189 Pneumonia, unspecified organism: Secondary | ICD-10-CM | POA: Diagnosis not present

## 2021-01-29 IMAGING — CR DG CHEST 2V
2 series · 2 of 2 positions shown · non-contrast
Comparison: [DATE]

CLINICAL DATA: 75-year-old female with right lower lobe pneumonia

EXAM:
CHEST - 2 VIEW

[chest pa]
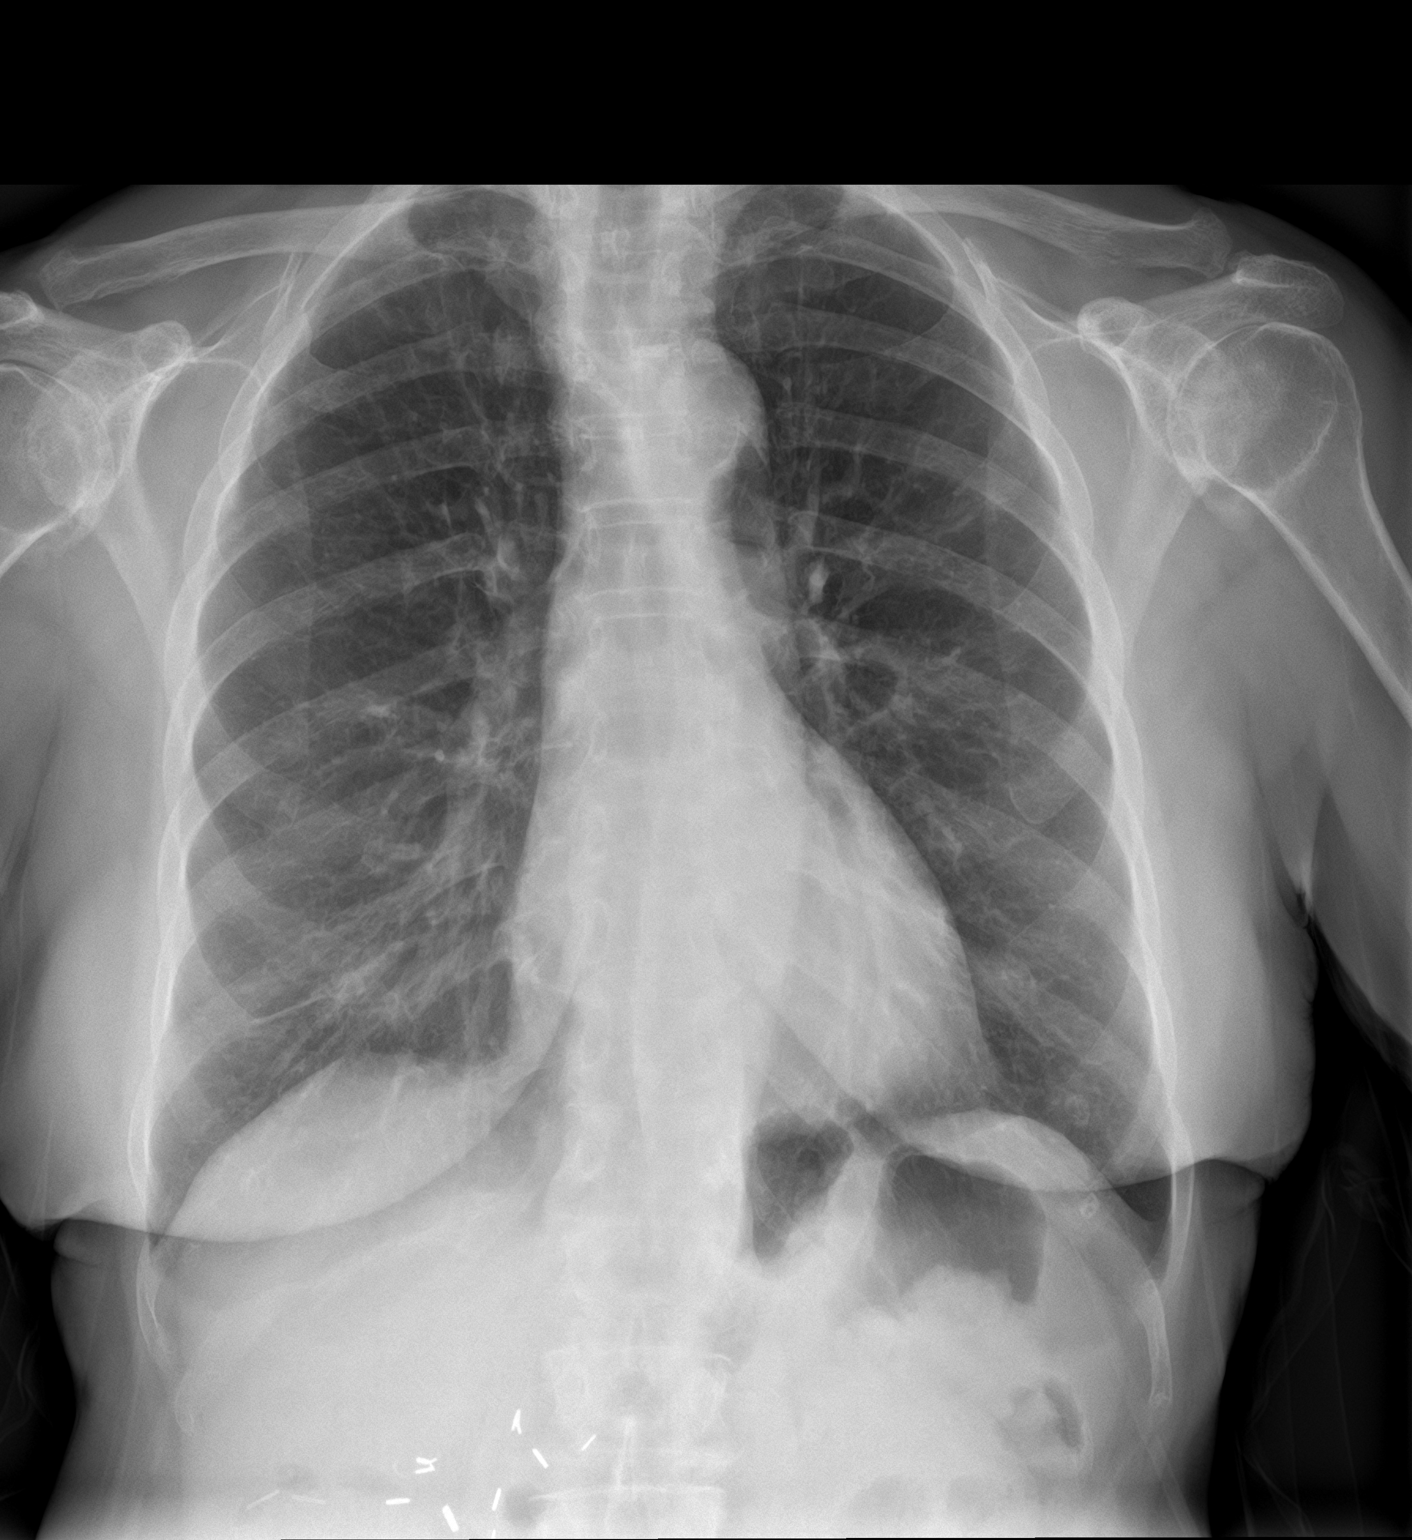

[chest lat]
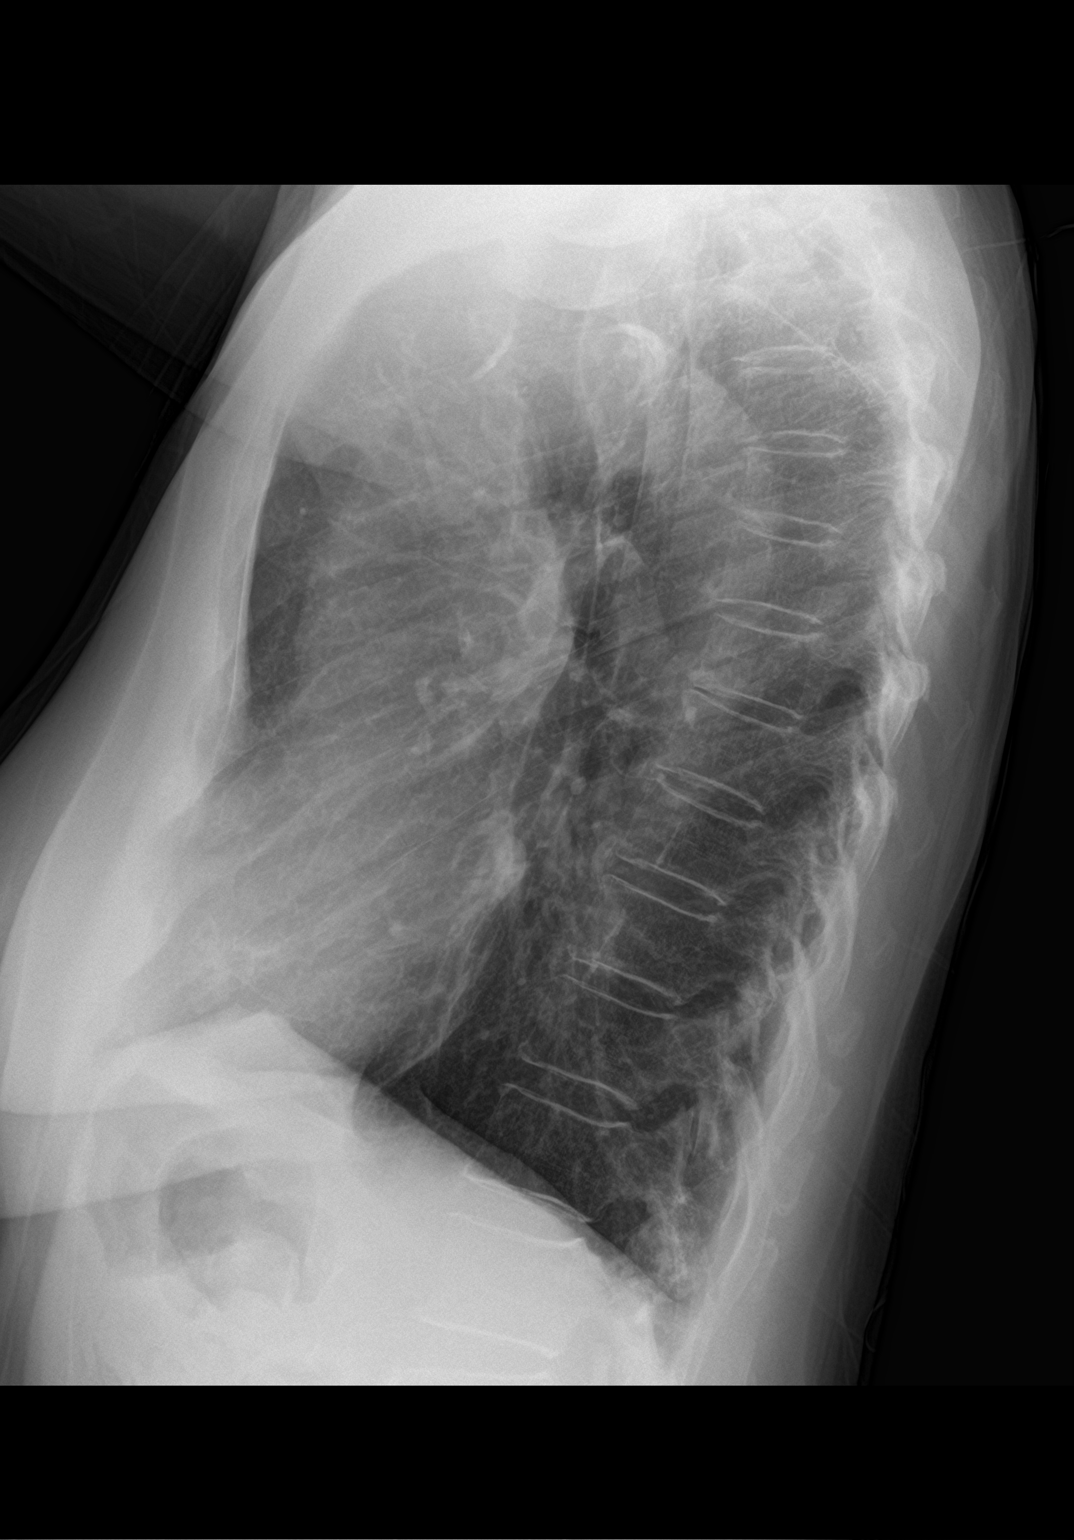

[2 of 2 positions shown; findings below may reference images not displayed]

FINDINGS: Cardiomediastinal silhouette unchanged in size and contour. No
evidence of central vascular congestion. No interlobular septal
thickening.

Near complete resolution of airspace disease at the right lung base.
Residual mild linear/nodular opacity.

Stigmata of emphysema, with increased retrosternal airspace,
flattened hemidiaphragms, increased AP diameter, and hyperinflation
on the AP view.

Similar appearance of architectural distortion in the infrahilar
regions bilaterally.

No pneumothorax or pleural effusion. Coarsened interstitial
markings, with no confluent airspace disease.

No acute displaced fracture. Degenerative changes of the spine.
IMPRESSION: Near-complete resolution of the mixed interstitial and airspace
disease at the right lung base, with background chronic
changes/emphysema.

## 2021-02-05 ENCOUNTER — Other Ambulatory Visit: Payer: Self-pay | Admitting: Family Medicine

## 2021-02-05 DIAGNOSIS — E785 Hyperlipidemia, unspecified: Secondary | ICD-10-CM

## 2021-02-05 DIAGNOSIS — M19019 Primary osteoarthritis, unspecified shoulder: Secondary | ICD-10-CM

## 2021-02-05 NOTE — Telephone Encounter (Signed)
Last office visit 01/23/2021 for follow up PNA.  Last refilled 01/09/2021 for #60 with no refills.  CPE scheduled 04/20/2021.

## 2021-02-13 ENCOUNTER — Other Ambulatory Visit: Payer: Self-pay | Admitting: Family Medicine

## 2021-02-13 ENCOUNTER — Telehealth: Payer: Self-pay | Admitting: Family Medicine

## 2021-02-13 DIAGNOSIS — Z1211 Encounter for screening for malignant neoplasm of colon: Secondary | ICD-10-CM

## 2021-02-13 DIAGNOSIS — I10 Essential (primary) hypertension: Secondary | ICD-10-CM

## 2021-02-13 NOTE — Telephone Encounter (Signed)
Spoke with Heather Potter. She is requesting referral for screening colonoscopy.  She is requesting [place] location.

## 2021-02-13 NOTE — Telephone Encounter (Signed)
Left message for Ms. Brunke to return call to office.  Need more information about the referral she is calling about.

## 2021-02-13 NOTE — Telephone Encounter (Signed)
Pt called in regarding a referral for colonoscopy in [place] . Would like a call back [phone]

## 2021-02-15 ENCOUNTER — Other Ambulatory Visit (INDEPENDENT_AMBULATORY_CARE_PROVIDER_SITE_OTHER): Payer: Self-pay

## 2021-02-15 DIAGNOSIS — M25712 Osteophyte, left shoulder: Secondary | ICD-10-CM | POA: Diagnosis not present

## 2021-02-15 DIAGNOSIS — M25511 Pain in right shoulder: Secondary | ICD-10-CM | POA: Diagnosis not present

## 2021-02-15 DIAGNOSIS — M0609 Rheumatoid arthritis without rheumatoid factor, multiple sites: Secondary | ICD-10-CM | POA: Diagnosis not present

## 2021-02-15 DIAGNOSIS — M353 Polymyalgia rheumatica: Secondary | ICD-10-CM | POA: Diagnosis not present

## 2021-02-15 DIAGNOSIS — G8929 Other chronic pain: Secondary | ICD-10-CM | POA: Diagnosis not present

## 2021-02-15 DIAGNOSIS — M7989 Other specified soft tissue disorders: Secondary | ICD-10-CM | POA: Diagnosis not present

## 2021-02-15 DIAGNOSIS — M19011 Primary osteoarthritis, right shoulder: Secondary | ICD-10-CM | POA: Diagnosis not present

## 2021-02-15 DIAGNOSIS — M25512 Pain in left shoulder: Secondary | ICD-10-CM | POA: Diagnosis not present

## 2021-02-15 DIAGNOSIS — M19012 Primary osteoarthritis, left shoulder: Secondary | ICD-10-CM | POA: Diagnosis not present

## 2021-02-15 DIAGNOSIS — Z79899 Other long term (current) drug therapy: Secondary | ICD-10-CM | POA: Diagnosis not present

## 2021-02-15 DIAGNOSIS — M25711 Osteophyte, right shoulder: Secondary | ICD-10-CM | POA: Diagnosis not present

## 2021-02-15 DIAGNOSIS — M159 Polyosteoarthritis, unspecified: Secondary | ICD-10-CM | POA: Diagnosis not present

## 2021-02-15 DIAGNOSIS — Z1211 Encounter for screening for malignant neoplasm of colon: Secondary | ICD-10-CM

## 2021-02-15 MED ORDER — PEG 3350-KCL-NA BICARB-NACL 420 G PO SOLR: 4000.0000 mL | Freq: Once | ORAL | 0 refills | Status: AC

## 2021-02-15 NOTE — Progress Notes (Signed)
Gastroenterology Pre-Procedure Review  Request Date: 03/05/2021 Requesting Physician: Dr. Vicente Males  PATIENT REVIEW QUESTIONS: The patient responded to the following health history questions as indicated:    1. Are you having any GI issues? no 2. Do you have a personal history of Polyps? no 3. Do you have a family history of Colon Cancer or Polyps? no 4. Diabetes Mellitus? no 5. Joint replacements in the past 12 months?no 6. Major health problems in the past 3 months?no 7. Any artificial heart valves, MVP, or defibrillator?no    MEDICATIONS & ALLERGIES:    Patient reports the following regarding taking any anticoagulation/antiplatelet therapy:   Plavix, Coumadin, Eliquis, Xarelto, Lovenox, Pradaxa, Brilinta, or Effient? no Aspirin? yes (81MG )  Patient confirms/reports the following medications:  Current Outpatient Medications  Medication Sig Dispense Refill   acetaminophen (TYLENOL) 650 MG CR tablet Take 650 mg by mouth every 8 (eight) hours as needed for pain.     amLODipine (NORVASC) 10 MG tablet Take 1 tablet by mouth once daily 90 tablet 0   amoxicillin-clavulanate (AUGMENTIN) 875-125 MG tablet Take 1 tablet by mouth every 12 (twelve) hours. 14 tablet 0   aspirin EC 81 MG tablet Take 81 mg by mouth daily. Swallow whole.     azithromycin (ZITHROMAX Z-PAK) 250 MG tablet As described on packaging. Two tablets first day then one each day after until gone 6 tablet 0   Calcium Carbonate-Vit D-Min (CALCIUM 1200 PO) Take 600 mg by mouth 2 (two) times daily.      cholecalciferol (VITAMIN D) 400 units TABS tablet Take 400 Units by mouth at bedtime.      clindamycin (CLEOCIN) 300 MG capsule Take by mouth.     diclofenac Sodium (VOLTAREN) 1 % GEL Apply 2 g topically 4 (four) times daily.     folic acid (FOLVITE) 1 MG tablet Take 1 mg by mouth daily.     gabapentin (NEURONTIN) 100 MG capsule Take 1 capsule by mouth twice daily 180 capsule 1   hydrochlorothiazide (HYDRODIURIL) 25 MG tablet Take 1  tablet by mouth once daily 90 tablet 1   levothyroxine (SYNTHROID) 88 MCG tablet Take 1 tablet (88 mcg total) by mouth daily. 90 tablet 3   methotrexate 2.5 MG tablet TAKE 6 TABLETS BY MOUTH ONCE A WEEK (ALL ON THE SAME DAY)     predniSONE (DELTASONE) 1 MG tablet Take 1 mg by mouth daily with breakfast.     simvastatin (ZOCOR) 20 MG tablet TAKE 1/2 (ONE-HALF) TABLET BY MOUTH ONCE DAILY AT  6PM 90 tablet 0   traMADol (ULTRAM) 50 MG tablet TAKE 1 TABLET BY MOUTH EVERY 12 HOURS AS NEEDED FOR SEVERE PAIN 60 tablet 0   TURMERIC PO Take 1 capsule by mouth daily.     No current facility-administered medications for this visit.    Patient confirms/reports the following allergies:  No Known Allergies  No orders of the defined types were placed in this encounter.   AUTHORIZATION INFORMATION Primary Insurance: 1D#: Group #:  Secondary Insurance: 1D#: Group #:  SCHEDULE INFORMATION: Date: 03/05/2021 Time: Location:[facility]

## 2021-02-19 ENCOUNTER — Other Ambulatory Visit: Payer: Self-pay

## 2021-02-19 ENCOUNTER — Ambulatory Visit
Admission: RE | Admit: 2021-02-19 | Discharge: 2021-02-19 | Disposition: A | Discharge status: Home / Self Care | Payer: PPO | Source: Ambulatory Visit | Attending: Family Medicine | Admitting: Family Medicine

## 2021-02-19 DIAGNOSIS — Z1231 Encounter for screening mammogram for malignant neoplasm of breast: Secondary | ICD-10-CM

## 2021-02-19 IMAGING — MG MM DIGITAL SCREENING BILAT W/ TOMO AND CAD
6 of 10 series · 6 of 30 positions shown · non-contrast
Comparison: Previous exam(s).

CLINICAL DATA: Screening.

EXAM:
DIGITAL SCREENING BILATERAL MAMMOGRAM WITH TOMOSYNTHESIS AND CAD
TECHNIQUE: Bilateral screening digital craniocaudal and mediolateral oblique
mammograms were obtained. Bilateral screening digital breast
tomosynthesis was performed. The images were evaluated with
computer-aided detection.

[R CC synth-2D]
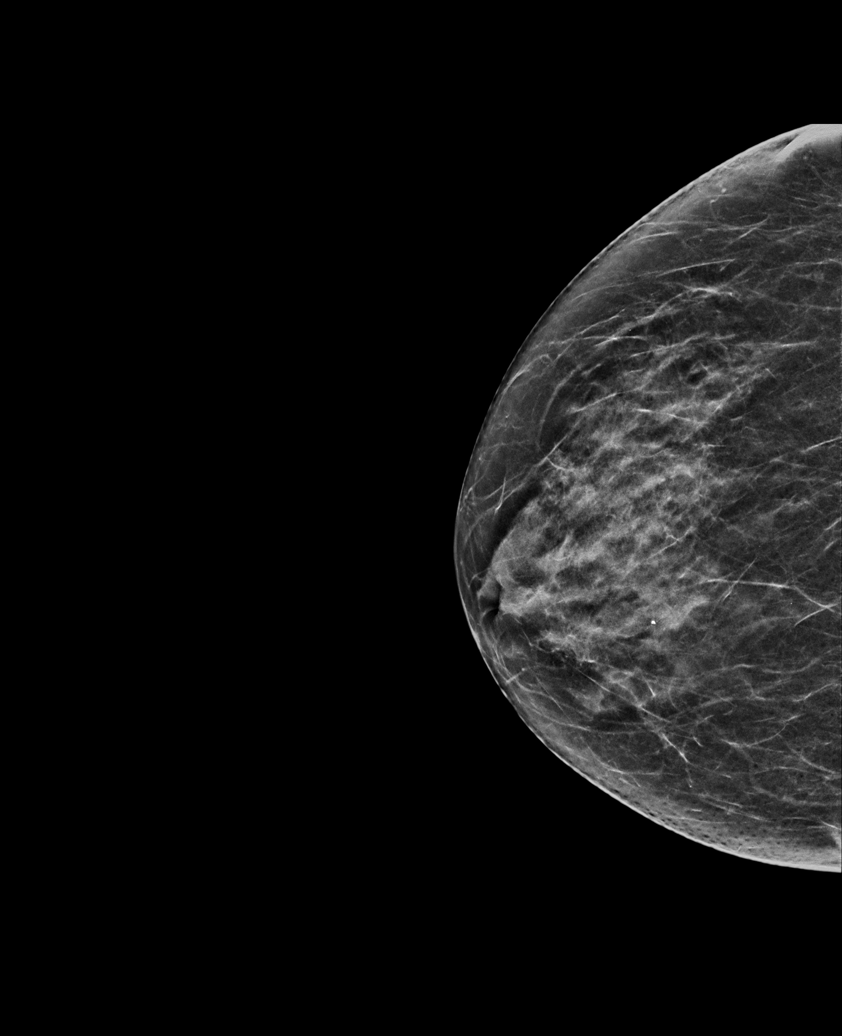

[L MLO synth-2D]
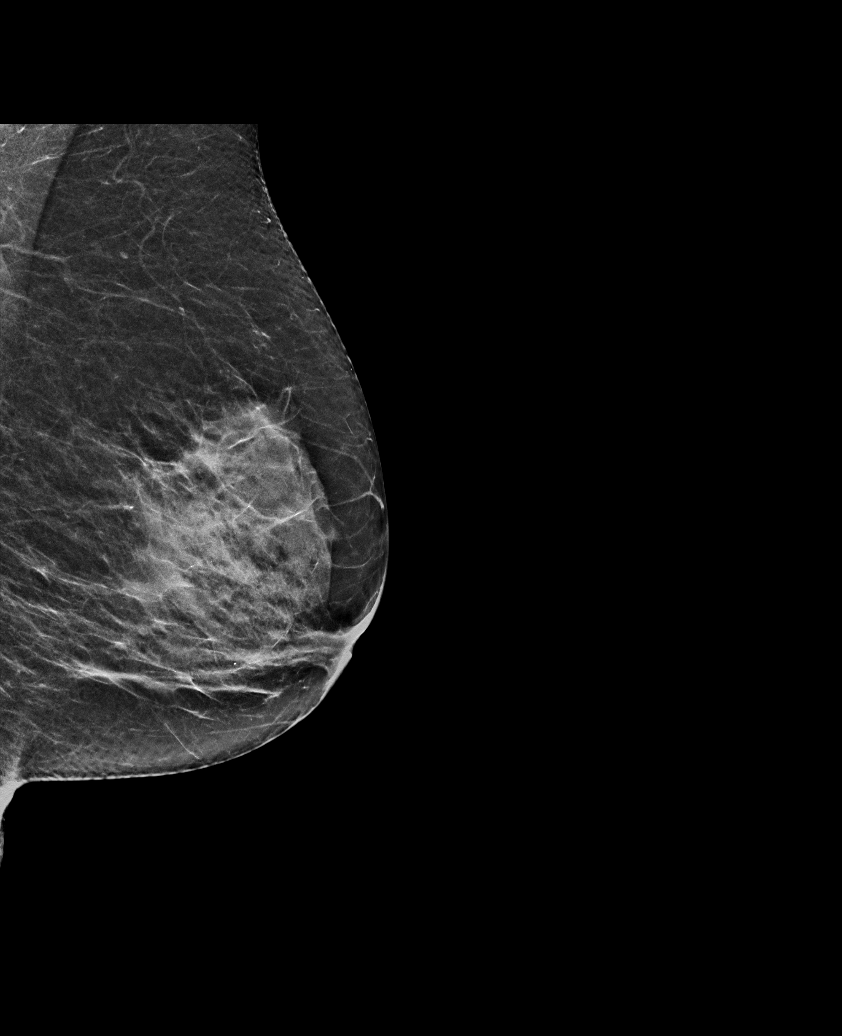

[L AT synth-2D]
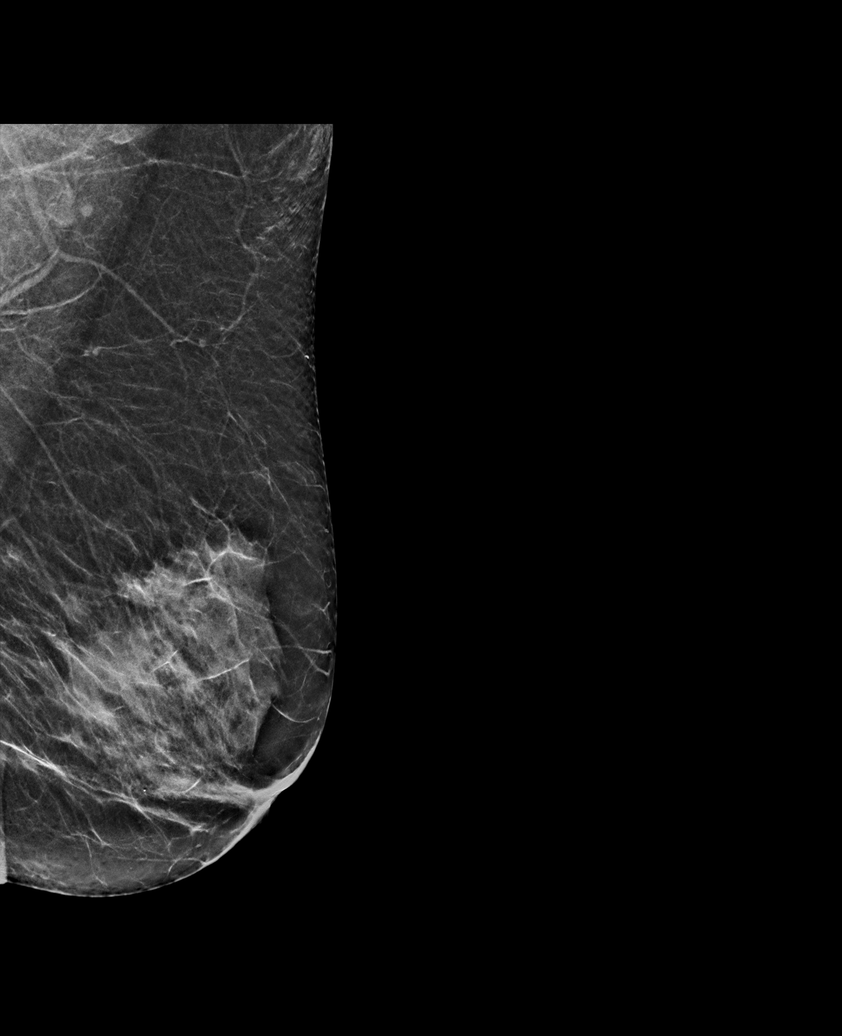

[R MLO synth-2D]
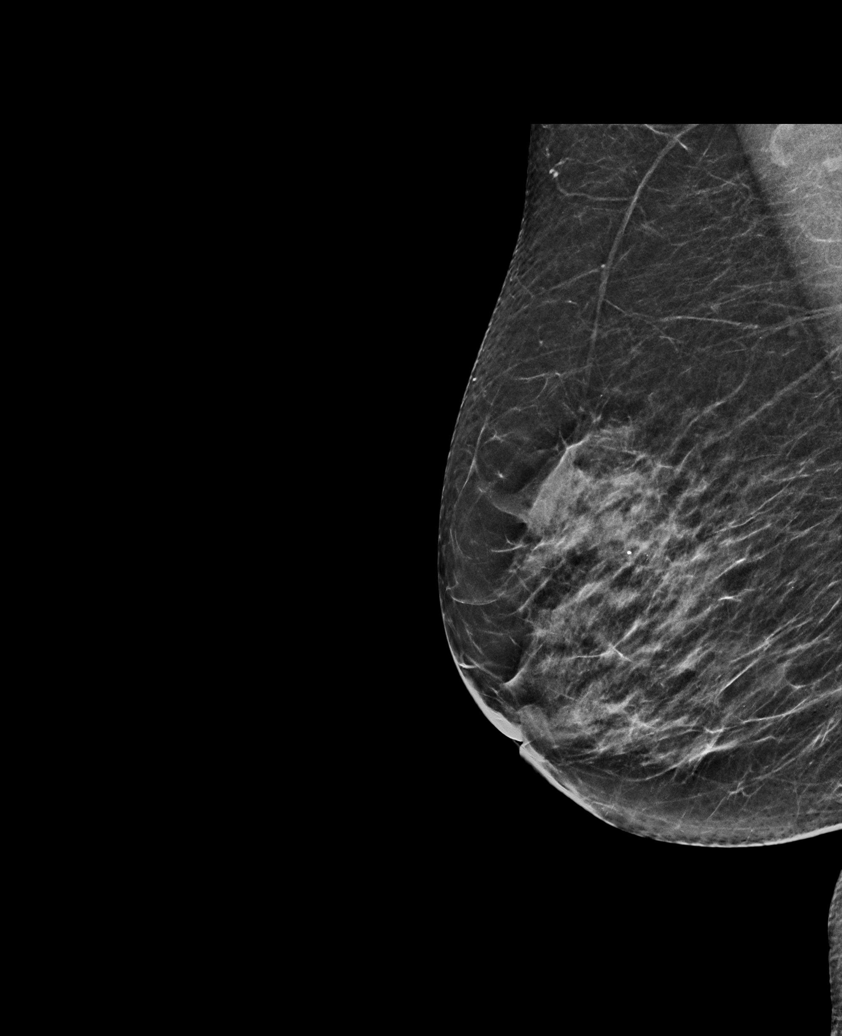

[L CC synth-2D]
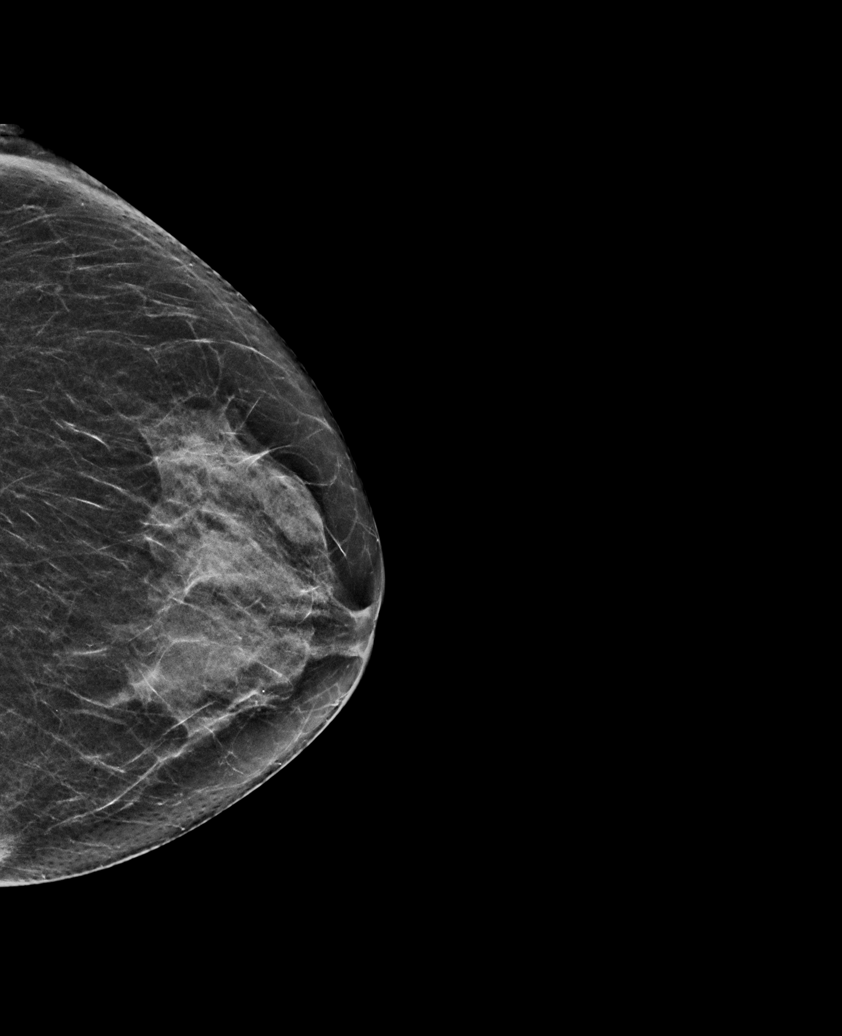

[L CC tomo · tomo slice 27/53.0]
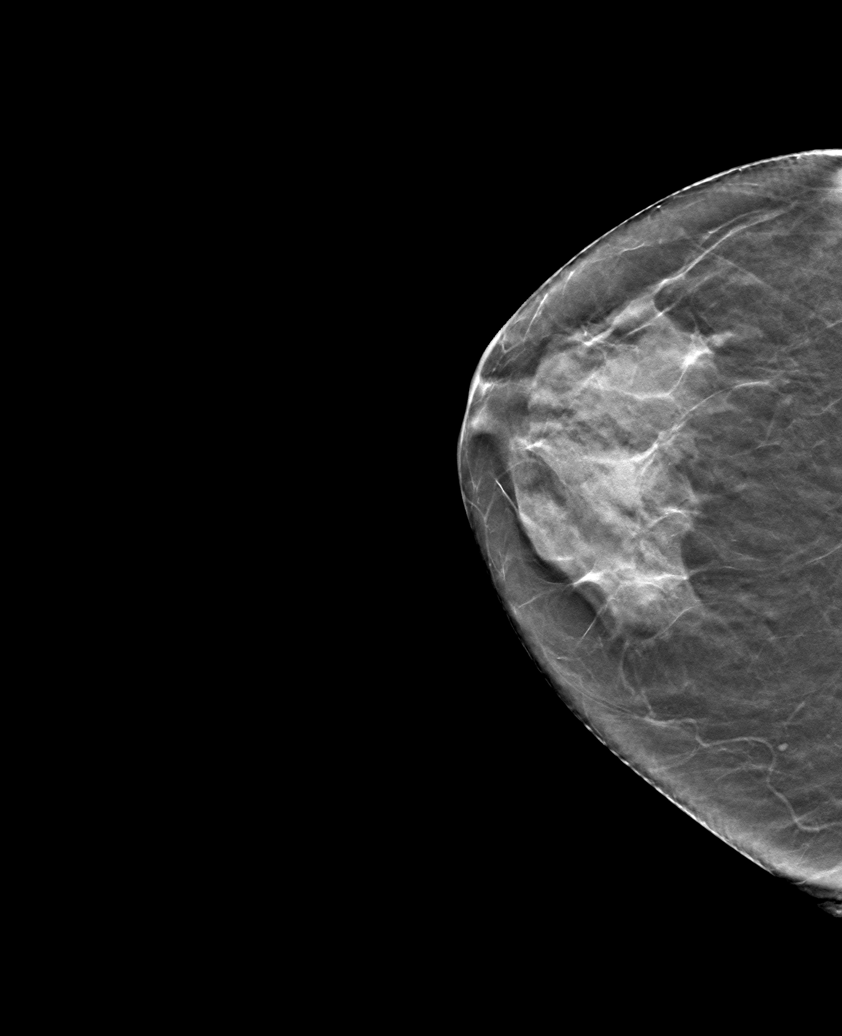

[6 of 30 positions shown; findings below may reference images not displayed]

ACR Breast Density Category c: The breast tissue is heterogeneously
dense, which may obscure small masses.
FINDINGS: There are no findings suspicious for malignancy.
IMPRESSION: No mammographic evidence of malignancy. A result letter of this
screening mammogram will be mailed directly to the patient.

RECOMMENDATION:
Screening mammogram in one year. (Code:[V2])

BI-RADS CATEGORY  1: Negative.

## 2021-02-23 ENCOUNTER — Telehealth: Payer: Self-pay | Admitting: Family Medicine

## 2021-02-23 NOTE — Telephone Encounter (Signed)
Tried calling her but she did not answer LVM for her to call back.

## 2021-02-23 NOTE — Telephone Encounter (Signed)
Pt returning call

## 2021-02-23 NOTE — Telephone Encounter (Signed)
Pt calling about her test results

## 2021-02-27 NOTE — Telephone Encounter (Signed)
LVM home and cell number.

## 2021-02-28 DIAGNOSIS — D2261 Melanocytic nevi of right upper limb, including shoulder: Secondary | ICD-10-CM | POA: Diagnosis not present

## 2021-02-28 DIAGNOSIS — D225 Melanocytic nevi of trunk: Secondary | ICD-10-CM | POA: Diagnosis not present

## 2021-02-28 DIAGNOSIS — D2271 Melanocytic nevi of right lower limb, including hip: Secondary | ICD-10-CM | POA: Diagnosis not present

## 2021-02-28 DIAGNOSIS — L821 Other seborrheic keratosis: Secondary | ICD-10-CM | POA: Diagnosis not present

## 2021-02-28 DIAGNOSIS — D2262 Melanocytic nevi of left upper limb, including shoulder: Secondary | ICD-10-CM | POA: Diagnosis not present

## 2021-02-28 DIAGNOSIS — L438 Other lichen planus: Secondary | ICD-10-CM | POA: Diagnosis not present

## 2021-02-28 DIAGNOSIS — D2272 Melanocytic nevi of left lower limb, including hip: Secondary | ICD-10-CM | POA: Diagnosis not present

## 2021-02-28 NOTE — Telephone Encounter (Signed)
Spoke with the patient informed her that her mammogram was normal. Patient stated that she is appreciative for the call.

## 2021-02-28 NOTE — Telephone Encounter (Signed)
Lvm on patient's home number again.

## 2021-03-01 DIAGNOSIS — Z96641 Presence of right artificial hip joint: Secondary | ICD-10-CM | POA: Diagnosis not present

## 2021-03-05 ENCOUNTER — Ambulatory Visit
Admission: RE | Admit: 2021-03-05 | Discharge: 2021-03-05 | Disposition: A | Discharge status: Home / Self Care | Payer: PPO | Attending: Gastroenterology | Admitting: Gastroenterology

## 2021-03-05 ENCOUNTER — Ambulatory Visit: Payer: PPO | Admitting: Registered Nurse

## 2021-03-05 ENCOUNTER — Encounter
Admission: RE | Disposition: A | Discharge status: Home / Self Care | Payer: Self-pay | Source: Home / Self Care | Attending: Gastroenterology

## 2021-03-05 DIAGNOSIS — Z1211 Encounter for screening for malignant neoplasm of colon: Secondary | ICD-10-CM | POA: Diagnosis not present

## 2021-03-05 DIAGNOSIS — Z8249 Family history of ischemic heart disease and other diseases of the circulatory system: Secondary | ICD-10-CM | POA: Diagnosis not present

## 2021-03-05 DIAGNOSIS — Z7952 Long term (current) use of systemic steroids: Secondary | ICD-10-CM | POA: Insufficient documentation

## 2021-03-05 DIAGNOSIS — Z79899 Other long term (current) drug therapy: Secondary | ICD-10-CM | POA: Insufficient documentation

## 2021-03-05 DIAGNOSIS — Z87891 Personal history of nicotine dependence: Secondary | ICD-10-CM | POA: Insufficient documentation

## 2021-03-05 DIAGNOSIS — Z980 Intestinal bypass and anastomosis status: Secondary | ICD-10-CM | POA: Diagnosis not present

## 2021-03-05 DIAGNOSIS — D123 Benign neoplasm of transverse colon: Secondary | ICD-10-CM | POA: Insufficient documentation

## 2021-03-05 DIAGNOSIS — Z905 Acquired absence of kidney: Secondary | ICD-10-CM | POA: Insufficient documentation

## 2021-03-05 DIAGNOSIS — K640 First degree hemorrhoids: Secondary | ICD-10-CM | POA: Diagnosis not present

## 2021-03-05 DIAGNOSIS — K639 Disease of intestine, unspecified: Secondary | ICD-10-CM | POA: Diagnosis not present

## 2021-03-05 DIAGNOSIS — D122 Benign neoplasm of ascending colon: Secondary | ICD-10-CM

## 2021-03-05 DIAGNOSIS — Z800 Family history of malignant neoplasm of digestive organs: Secondary | ICD-10-CM | POA: Diagnosis not present

## 2021-03-05 DIAGNOSIS — Z791 Long term (current) use of non-steroidal anti-inflammatories (NSAID): Secondary | ICD-10-CM | POA: Diagnosis not present

## 2021-03-05 DIAGNOSIS — K635 Polyp of colon: Secondary | ICD-10-CM | POA: Diagnosis not present

## 2021-03-05 DIAGNOSIS — Z825 Family history of asthma and other chronic lower respiratory diseases: Secondary | ICD-10-CM | POA: Diagnosis not present

## 2021-03-05 DIAGNOSIS — E785 Hyperlipidemia, unspecified: Secondary | ICD-10-CM | POA: Diagnosis not present

## 2021-03-05 HISTORY — PX: COLONOSCOPY WITH PROPOFOL: SHX5780

## 2021-03-05 SURGERY — COLONOSCOPY WITH PROPOFOL
Anesthesia: General

## 2021-03-05 MED ORDER — SODIUM CHLORIDE 0.9 % IV SOLN
INTRAVENOUS | Status: DC
  Administered 2021-03-05: 20 mL/h via INTRAVENOUS

## 2021-03-05 MED ORDER — LIDOCAINE HCL (CARDIAC) PF 100 MG/5ML IV SOSY
PREFILLED_SYRINGE | INTRAVENOUS | Status: DC | PRN
  Administered 2021-03-05: 60 mg via INTRAVENOUS

## 2021-03-05 MED ORDER — EPHEDRINE 5 MG/ML INJ
INTRAVENOUS | Status: AC
  Filled 2021-03-05: qty 5

## 2021-03-05 MED ORDER — PROPOFOL 10 MG/ML IV BOLUS
INTRAVENOUS | Status: DC | PRN
Start: 2021-03-05 — End: 2021-03-05
  Administered 2021-03-05: 50 mg via INTRAVENOUS

## 2021-03-05 MED ORDER — EPHEDRINE SULFATE 50 MG/ML IJ SOLN
INTRAMUSCULAR | Status: DC | PRN
  Administered 2021-03-05: 5 mg via INTRAVENOUS
  Administered 2021-03-05: 10 mg via INTRAVENOUS

## 2021-03-05 MED ORDER — GLYCOPYRROLATE 0.2 MG/ML IJ SOLN
INTRAMUSCULAR | Status: DC | PRN
  Administered 2021-03-05: .2 mg via INTRAVENOUS

## 2021-03-05 MED ORDER — PROPOFOL 500 MG/50ML IV EMUL
INTRAVENOUS | Status: DC | PRN
  Administered 2021-03-05: 130 ug/kg/min via INTRAVENOUS

## 2021-03-05 NOTE — Anesthesia Preprocedure Evaluation (Signed)
Anesthesia Evaluation    Airway Mallampati: II  TM Distance: >3 FB Neck ROM: Full    Dental no notable dental hx.    Pulmonary pneumonia, resolved, former smoker,    Pulmonary exam normal        Cardiovascular hypertension, Pt. on medications Normal cardiovascular exam     Neuro/Psych    GI/Hepatic Bowel prep,  Endo/Other  Hypothyroidism   Renal/GU      Musculoskeletal  (+) Arthritis , Rheumatoid disorders and steroids,    Abdominal   Peds  Hematology   Anesthesia Other Findings   Reproductive/Obstetrics                             Anesthesia Physical Anesthesia Plan  ASA: 2  Anesthesia Plan: General   Post-op Pain Management:    Induction: Intravenous  PONV Risk Score and Plan: 2 and Propofol infusion and TIVA  Airway Management Planned: Natural Airway and Nasal Cannula  Additional Equipment:   Intra-op Plan:   Post-operative Plan:   Informed Consent: I have reviewed the patients History and Physical, chart, labs and discussed the procedure including the risks, benefits and alternatives for the proposed anesthesia with the patient or authorized representative who has indicated his/her understanding and acceptance.       Plan Discussed with: CRNA, Anesthesiologist and Surgeon  Anesthesia Plan Comments:         Anesthesia Quick Evaluation

## 2021-03-05 NOTE — Transfer of Care (Signed)
Immediate Anesthesia Transfer of Care Note  Patient: Heather Potter  Procedure(s) Performed: COLONOSCOPY WITH PROPOFOL  Patient Location: Endoscopy Unit  Anesthesia Type:General  Level of Consciousness: drowsy  Airway & Oxygen Therapy: Patient Spontanous Breathing  Post-op Assessment: Report given to RN and Post -op Vital signs reviewed and stable  Post vital signs: Reviewed and stable  Last Vitals:  Vitals Value Taken Time  BP 107/44 03/05/21 0846  Temp 36.1 C 03/05/21 0846  Pulse 70 03/05/21 0847  Resp 14 03/05/21 0847  SpO2 98 % 03/05/21 0847  Vitals shown include unvalidated device data.  Last Pain:  Vitals:   03/05/21 0846  TempSrc: Temporal  PainSc: Asleep         Complications: No notable events documented.

## 2021-03-05 NOTE — Op Note (Signed)
[facility] Gastroenterology Patient Name: Heather Potter Procedure Date: 03/05/2021 8:15 AM MRN: [id number] Account #: [id number] Date of Birth: 1946/02/03 Admit Type: Outpatient Age: 75 Room: [facility] ENDO ROOM 3 Gender: Female Note Status: Finalized Instrument Name: Peds Colonoscope [id number] Procedure:             Colonoscopy Indications:           Screening for colorectal malignant neoplasm Providers:             Jonathon Bellows MD, MD Referring MD:          Jinny Sanders MD, MD (Referring MD) Medicines:             Monitored Anesthesia Care Complications:         No immediate complications. Procedure:             Pre-Anesthesia Assessment:                        - Prior to the procedure, a History and Physical was                         performed, and patient medications, allergies and                         sensitivities were reviewed. The patient's tolerance                         of previous anesthesia was reviewed.                        - The risks and benefits of the procedure and the                         sedation options and risks were discussed with the                         patient. All questions were answered and informed                         consent was obtained.                        - After reviewing the risks and benefits, the patient                         was deemed in satisfactory condition to undergo the                         procedure.                        - ASA Grade Assessment: III - A patient with severe                         systemic disease.                        After obtaining informed consent, the colonoscope was                         passed under  direct vision. Throughout the procedure,                         the patient's blood pressure, pulse, and oxygen                         saturations were monitored continuously. The                         Colonoscope was introduced through the anus and                          advanced to the the ileocolonic anastomosis. The                         quality of the bowel preparation was good. Findings:      The perianal and digital rectal examinations were normal.      There was evidence of a prior end-to-side colo-colonic anastomosis in       the ascending colon. This was patent and was characterized by healthy       appearing mucosa. The anastomosis was traversed.      A 3 mm polyp was found in the transverse colon. The polyp was sessile.       The polyp was removed with a cold biopsy forceps. Resection and       retrieval were complete.      Non-bleeding internal hemorrhoids were found during retroflexion. The       hemorrhoids were large and Grade I (internal hemorrhoids that do not       prolapse). Impression:            - Patent end-to-side colo-colonic anastomosis,                         characterized by healthy appearing mucosa.                        - One 3 mm polyp in the transverse colon, removed with                         a cold biopsy forceps. Resected and retrieved.                        - Non-bleeding internal hemorrhoids. Recommendation:        - Discharge patient to home (with escort).                        - Resume previous diet.                        - Continue present medications.                        - Await pathology results.                        - Repeat colonoscopy is not recommended due to current                         age (47 years or older) for surveillance. Procedure Code(s):     ---  Professional ---                        [phone], Colonoscopy, flexible; with biopsy, single or                         multiple Diagnosis Code(s):     --- Professional ---                        Z12.11, Encounter for screening for malignant neoplasm                         of colon                        Z98.0, Intestinal bypass and anastomosis status                        K63.5, Polyp of colon                        K64.0, First degree  hemorrhoids CPT copyright 2019 American Medical Association. All rights reserved. The codes documented in this report are preliminary and upon coder review may  be revised to meet current compliance requirements. Jonathon Bellows, MD Jonathon Bellows MD, MD 03/05/2021 8:46:45 AM This report has been signed electronically. Number of Addenda: 0 Note Initiated On: 03/05/2021 8:15 AM Scope Withdrawal Time: 0 hours 10 minutes 56 seconds  Total Procedure Duration: 0 hours 15 minutes 34 seconds  Estimated Blood Loss:  Estimated blood loss: none.      [facility]

## 2021-03-05 NOTE — H&P (Signed)
Jonathon Bellows, MD [address], [place], [place], [place], [address] [address], [place], [place], [place], [address] Phone: [phone]  Fax: [phone]  Primary Care Physician:  Jinny Sanders, MD   Pre-Procedure History & Physical: HPI:  Heather Potter is a 75 y.o. female is here for an colonoscopy.   Past Medical History:  Diagnosis Date   Colitis    History of blood transfusion    D and C and miscarriage   History of gynecologic surgery    had right ovary removed, unsure about uterus/cervix   Hyperlipidemia    Hypertension    Hypothyroidism    Lichen planus 0/25/4270   Treated by Dr. Kellie Moor (derm) with methotrexate   Osteoarthritis    Osteopenia     Past Surgical History:  Procedure Laterality Date   [place]   colectomy for ? 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 Authorizing Provider  acetaminophen (TYLENOL) 650 MG CR tablet Take 650 mg by mouth every 8 (eight) hours as needed for pain.   Yes [provider]  amLODipine (NORVASC) 10 MG tablet Take 1 tablet by mouth once daily 02/13/21  Yes Bedsole, Amy E, MD  amoxicillin-clavulanate (AUGMENTIN) 875-125 MG tablet Take 1 tablet by mouth every 12 (twelve) hours. 01/01/21  Yes Luvenia Redden, PA-C  aspirin EC 81 MG tablet Take 81 mg by  mouth daily. Swallow whole.   Yes [provider]  azithromycin (ZITHROMAX Z-PAK) 250 MG tablet As described on packaging. Two tablets first day then one each day after until gone 01/01/21  Yes Luvenia Redden, PA-C  Calcium Carbonate-Vit D-Min (CALCIUM 1200 PO) Take 600 mg by mouth 2 (two) times daily.    Yes [provider]  cholecalciferol (VITAMIN D) 400 units TABS tablet Take 400 Units by mouth at bedtime.    Yes [provider]  clindamycin (CLEOCIN) 300 MG capsule Take by mouth. 01/29/21  Yes [provider]  diclofenac Sodium (VOLTAREN) 1 % GEL Apply 2 g topically 4 (four) times daily. 06/13/20  Yes [provider]  folic acid (FOLVITE) 1 MG tablet Take 1 mg by mouth daily. 05/27/20  Yes [provider]  gabapentin (NEURONTIN) 100 MG capsule Take 1 capsule by mouth twice daily 10/02/20  Yes Bedsole, Amy E, MD  hydrochlorothiazide (HYDRODIURIL) 25 MG tablet Take 1 tablet by mouth once daily 11/24/20  Yes Bedsole, Amy E, MD  levothyroxine (SYNTHROID) 88 MCG tablet Take 1 tablet (88 mcg total) by mouth daily. 06/26/20  Yes Bedsole, Amy E, MD  methotrexate 2.5 MG tablet TAKE 6 TABLETS BY MOUTH ONCE A WEEK (ALL ON THE SAME DAY) 05/31/20  Yes [provider]  predniSONE (DELTASONE) 1 MG tablet Take 1 mg by mouth daily with breakfast.   Yes [provider]  simvastatin (ZOCOR) 20 MG tablet TAKE 1/2 (ONE-HALF) TABLET BY MOUTH ONCE DAILY AT  [facility] 02/05/21  Yes Bedsole, Amy E, MD  traMADol (ULTRAM) 50 MG tablet TAKE 1 TABLET BY MOUTH EVERY 12 HOURS AS NEEDED FOR SEVERE PAIN 02/06/21  Yes Bedsole, Amy E, MD  TURMERIC PO Take 1 capsule by mouth daily.   Yes [provider]    Allergies as of 02/15/2021   (No Known Allergies)    Family History  Problem Relation Age of Onset   Pancreatic cancer Mother    Heart attack Father    Heart attack Brother    Liver cancer Brother    Asthma Brother    Colon cancer Neg Hx    Breast  cancer Neg Hx     Social History   Socioeconomic History   Marital status: Married    Spouse name: Not on file   Number of children: 1   Years of education: Not on file   Highest education level: Not on file  Occupational History   Occupation: Homemaker  Tobacco Use   Smoking status: Former    Packs/day: 1.00    Years: 40.00    Pack years: 40.00    Types: Cigarettes    Quit date: 05/21/2007    Years since quitting: 13.8   Smokeless tobacco: Never  Vaping Use   Vaping Use: Never used  Substance and Sexual Activity   Alcohol use: Yes    Comment: rare- wine   Drug use: No   Sexual activity: Not Currently  Other Topics Concern   Not on file  Social History Narrative   One adopted son   Regular exercise: yes      Does not have a living will or HPOA.   Desires CPR and life support for only short period if not futile.         Social Determinants of Health   Financial Resource Strain: Low Risk    Difficulty of Paying Living Expenses: Not hard at all  Food Insecurity: No Food Insecurity   Worried About Charity fundraiser in the Last Year: Never true   [place] in the Last Year: Never true  Transportation Needs: No Transportation Needs   Lack of Transportation (Medical): No   Lack of Transportation (Non-Medical): No  Physical Activity: Inactive   Days of Exercise per Week: 0 days   Minutes of Exercise per Session: 0 min  Stress: No Stress Concern Present   Feeling of Stress : Not at all  Social Connections: Not on file  Intimate Partner Violence: Not At Risk   Fear of Current or Ex-Partner: No   Emotionally Abused: No   Physically Abused: No   Sexually Abused: No    Review of Systems: See HPI, otherwise negative ROS  Physical Exam: BP 129/82   Pulse 71   Temp (!) 97.2 F (36.2 C) (Temporal)   Resp 20   Ht 5\' 3"  (1.6 m)   Wt 59 kg   SpO2 99%   BMI 23.03 kg/m  General:   Alert,  pleasant and cooperative in NAD Head:  Normocephalic and  atraumatic. Neck:  Supple; no masses or thyromegaly. Lungs:  Clear throughout to auscultation, normal respiratory effort.    Heart:  +S1, +S2, Regular rate and rhythm, No edema. Abdomen:  Soft, nontender and nondistended. Normal  bowel sounds, without guarding, and without rebound.   Neurologic:  Alert and  oriented x4;  grossly normal neurologically.  Impression/Plan: Heather Potter is here for an colonoscopy to be performed for Screening colonoscopy average risk   Risks, benefits, limitations, and alternatives regarding  colonoscopy have been reviewed with the patient.  Questions have been answered.  All parties agreeable.   Jonathon Bellows, MD  03/05/2021, 8:14 AM

## 2021-03-05 NOTE — Anesthesia Postprocedure Evaluation (Signed)
Anesthesia Post Note  Patient: Heather Potter  Procedure(s) Performed: COLONOSCOPY WITH PROPOFOL  Patient location during evaluation: Phase II Anesthesia Type: General Level of consciousness: awake and alert, awake and oriented Pain management: pain level controlled Vital Signs Assessment: post-procedure vital signs reviewed and stable Respiratory status: spontaneous breathing, nonlabored ventilation and respiratory function stable Cardiovascular status: blood pressure returned to baseline and stable Postop Assessment: no apparent nausea or vomiting Anesthetic complications: no   No notable events documented.   Last Vitals:  Vitals:   03/05/21 0858 03/05/21 0906  BP: (!) 128/54 (!) 141/60  Pulse: 80 66  Resp: 16 17  Temp:    SpO2: 99% 95%    Last Pain:  Vitals:   03/05/21 0906  TempSrc:   PainSc: 0-No pain                 Phill Mutter

## 2021-03-06 ENCOUNTER — Encounter: Payer: Self-pay | Admitting: Gastroenterology

## 2021-03-06 LAB — SURGICAL PATHOLOGY

## 2021-03-08 ENCOUNTER — Encounter: Payer: Self-pay | Admitting: Gastroenterology

## 2021-03-09 DIAGNOSIS — M19011 Primary osteoarthritis, right shoulder: Secondary | ICD-10-CM | POA: Diagnosis not present

## 2021-03-09 DIAGNOSIS — M19012 Primary osteoarthritis, left shoulder: Secondary | ICD-10-CM | POA: Diagnosis not present

## 2021-03-14 ENCOUNTER — Other Ambulatory Visit: Payer: Self-pay | Admitting: Family Medicine

## 2021-03-14 DIAGNOSIS — M19019 Primary osteoarthritis, unspecified shoulder: Secondary | ICD-10-CM

## 2021-03-14 NOTE — Telephone Encounter (Signed)
Last office visit 01/23/21 for follow up PNA.  Last refilled 02/06/2021 for #60 with no refills.  CPE scheduled 04/20/2021.

## 2021-03-22 ENCOUNTER — Telehealth: Payer: Self-pay | Admitting: *Deleted

## 2021-03-22 NOTE — Telephone Encounter (Signed)
yes, can make the change to generic but should have TSH 4-6 week after change.

## 2021-03-22 NOTE — Telephone Encounter (Signed)
Received fax from [facility] stating: Euthrox not available.  Can we change to generic levothyroxine.  Please advise.

## 2021-03-23 NOTE — Telephone Encounter (Signed)
[facility] notified okay to change to Levothyroxine via fax.  Spoke with patient to make her aware of the change as well.  Patient is already scheduled for her CPE on 04/20/21.  Can recheck thyroid labs at that time.

## 2021-04-10 ENCOUNTER — Other Ambulatory Visit: Payer: Self-pay | Admitting: Family Medicine

## 2021-04-10 DIAGNOSIS — M19019 Primary osteoarthritis, unspecified shoulder: Secondary | ICD-10-CM

## 2021-04-10 NOTE — Telephone Encounter (Signed)
Last office visit 01/23/2021 for follow up PNA/Immunocompromised.  Last refilled 03/15/2021 for #60 with no refills.  CPE scheduled for 04/20/2021.

## 2021-04-20 ENCOUNTER — Encounter: Payer: PPO | Admitting: Family Medicine

## 2021-05-10 ENCOUNTER — Encounter: Payer: PPO | Admitting: Family Medicine

## 2021-05-14 ENCOUNTER — Other Ambulatory Visit: Payer: Self-pay | Admitting: Family Medicine

## 2021-05-14 DIAGNOSIS — M19019 Primary osteoarthritis, unspecified shoulder: Secondary | ICD-10-CM

## 2021-05-14 NOTE — Telephone Encounter (Signed)
Last office visit 01/23/21 for Pneumonia.  Last refilled 04/10/21 for #60 with no refills.  CPE scheduled 06/22/21.

## 2021-05-16 ENCOUNTER — Other Ambulatory Visit: Payer: Self-pay | Admitting: Family Medicine

## 2021-05-16 DIAGNOSIS — I10 Essential (primary) hypertension: Secondary | ICD-10-CM

## 2021-06-01 ENCOUNTER — Encounter: Payer: PPO | Admitting: Family Medicine

## 2021-06-08 ENCOUNTER — Other Ambulatory Visit: Payer: Self-pay | Admitting: Family Medicine

## 2021-06-08 DIAGNOSIS — M19019 Primary osteoarthritis, unspecified shoulder: Secondary | ICD-10-CM

## 2021-06-08 NOTE — Telephone Encounter (Signed)
Last office visit 01/23/2021 for follow up PNA.  Last refilled 05/15/21 for #60 with no refills.  CPE scheduled for 06/22/2021.

## 2021-06-18 ENCOUNTER — Other Ambulatory Visit: Payer: Self-pay | Admitting: *Deleted

## 2021-06-18 DIAGNOSIS — E785 Hyperlipidemia, unspecified: Secondary | ICD-10-CM

## 2021-06-18 MED ORDER — SIMVASTATIN 20 MG PO TABS: ORAL_TABLET | ORAL | 0 refills | Status: DC

## 2021-06-22 ENCOUNTER — Ambulatory Visit (INDEPENDENT_AMBULATORY_CARE_PROVIDER_SITE_OTHER): Payer: PPO | Admitting: Family Medicine

## 2021-06-22 ENCOUNTER — Other Ambulatory Visit: Payer: Self-pay

## 2021-06-22 VITALS — BP 130/78 | HR 69 | Temp 98.1°F | Ht 62.25 in | Wt 135.0 lb

## 2021-06-22 DIAGNOSIS — M79602 Pain in left arm: Secondary | ICD-10-CM

## 2021-06-22 DIAGNOSIS — W139XXD Fall from, out of or through building, not otherwise specified, subsequent encounter: Secondary | ICD-10-CM | POA: Diagnosis not present

## 2021-06-22 DIAGNOSIS — W139XXA Fall from, out of or through building, not otherwise specified, initial encounter: Secondary | ICD-10-CM | POA: Insufficient documentation

## 2021-06-22 NOTE — Patient Instructions (Signed)
Elevate and ice arm.  Can increase tramadol to 2 tab twice daily or 1 tab three times daily until following up at [facility].LImit arm motion as much as able until ORTHO visit.

## 2021-06-22 NOTE — Progress Notes (Signed)
Patient ID: Heather Potter, female    DOB: 03/30/1946, 76 y.o.   MRN: [id number]  This visit was conducted in person.  BP 130/78    Pulse 69    Temp 98.1 F (36.7 C) (Temporal)    Ht 5' 2.25" (1.581 m)    Wt 135 lb (61.2 kg)    SpO2 96%    BMI 24.49 kg/m    CC: Chief Complaint  Patient presents with   Follow-up    Urgent care: Had fall on asphalt on Jan 27th. Injured left arm. Ortho appt is scheduled on 06/26/21 at 2pm. Pt would like MRI done ahead of appt.      Subjective:   HPI: Heather Potter is a 76 y.o. female presenting on 06/22/2021 for Follow-up (Urgent care: Had fall on asphalt on Jan 27th. Injured left arm. Ortho appt is scheduled on 06/26/21 at 2pm. Pt would like MRI done ahead of appt. /)   1/27 she lost balance fell  when tripped by dog leash on left outstretched arm. No proceeding symptoms such as dizziness, chest pain.   Resulted in pain in left wrist and left elbow.Abrasion on right knee. Placed in arm sling  and thumb spica and referred to Ortho  for appt 06/26/2021  Using tylenol or tramadol for pain. Ice and elevation.    Reviewed UC note from 1/27..   She cannot wear the sling as it keeps falling down. Pain with  supine/pronation motion of arm.. causes pain in upper arm. No pain in wrist or thumb... when pushing down with hand.. pain  on thumb side of upper forearm. Pain 7/10 on pain scale.  Some bruising, no swelling. No numbness.  Already using tylenol , gabapentin  100 mg BID and tramadol 50 mg  BID or RA. Does not help much  with pain.  Just weaned off the prednisone for PMR  per Dr Posey Pronto.      Relevant past medical, surgical, family and social history reviewed and updated as indicated. Interim medical history since our last visit reviewed. Allergies and medications reviewed and updated. Outpatient Medications Prior to Visit  Medication Sig Dispense Refill   acetaminophen (TYLENOL) 650 MG CR tablet Take 650 mg by mouth every 8 (eight) hours as  needed for pain.     amLODipine (NORVASC) 10 MG tablet Take 1 tablet by mouth once daily 90 tablet 0   amoxicillin-clavulanate (AUGMENTIN) 875-125 MG tablet Take 1 tablet by mouth every 12 (twelve) hours. 14 tablet 0   aspirin EC 81 MG tablet Take 81 mg by mouth daily. Swallow whole.     Calcium Carbonate-Vit D-Min (CALCIUM 1200 PO) Take 600 mg by mouth 2 (two) times daily.      cholecalciferol (VITAMIN D) 400 units TABS tablet Take 400 Units by mouth at bedtime.      clindamycin (CLEOCIN) 300 MG capsule Take by mouth.     folic acid (FOLVITE) 1 MG tablet Take 1 mg by mouth daily.     gabapentin (NEURONTIN) 100 MG capsule Take 1 capsule by mouth twice daily 180 capsule 1   hydrochlorothiazide (HYDRODIURIL) 25 MG tablet Take 1 tablet by mouth once daily 90 tablet 0   levothyroxine (SYNTHROID) 88 MCG tablet Take 1 tablet (88 mcg total) by mouth daily. 90 tablet 3   methotrexate 2.5 MG tablet Take 17.5 mg by mouth once a week.     simvastatin (ZOCOR) 20 MG tablet TAKE 1/2 (ONE-HALF) TABLET BY MOUTH ONCE DAILY  AT  6PM 45 tablet 0   traMADol (ULTRAM) 50 MG tablet TAKE 1 TABLET BY MOUTH EVERY 12 HOURS AS NEEDED FOR SEVERE PAIN 60 tablet 0   TURMERIC PO Take 1 capsule by mouth daily.     mupirocin ointment (BACTROBAN) 2 % Apply topically 3 (three) times daily.     azithromycin (ZITHROMAX Z-PAK) 250 MG tablet As described on packaging. Two tablets first day then one each day after until gone 6 tablet 0   diclofenac Sodium (VOLTAREN) 1 % GEL Apply 2 g topically 4 (four) times daily.     predniSONE (DELTASONE) 1 MG tablet Take 1 mg by mouth daily with breakfast.     No facility-administered medications prior to visit.     Per HPI unless specifically indicated in ROS section below Review of Systems  Constitutional:  Negative for fatigue and fever.  HENT:  Negative for congestion.   Eyes:  Negative for pain.  Respiratory:  Negative for cough and shortness of breath.   Cardiovascular:  Negative for  chest pain, palpitations and leg swelling.  Gastrointestinal:  Negative for abdominal pain.  Genitourinary:  Negative for dysuria and vaginal bleeding.  Musculoskeletal:  Negative for back pain.  Neurological:  Negative for syncope, light-headedness and headaches.  Psychiatric/Behavioral:  Negative for dysphoric mood.   Objective:  BP 130/78    Pulse 69    Temp 98.1 F (36.7 C) (Temporal)    Ht 5' 2.25" (1.581 m)    Wt 135 lb (61.2 kg)    SpO2 96%    BMI 24.49 kg/m   Wt Readings from Last 3 Encounters:  06/22/21 135 lb (61.2 kg)  03/05/21 130 lb (59 kg)  01/23/21 132 lb (59.9 kg)      Physical Exam Constitutional:      General: She is not in acute distress.    Appearance: Normal appearance. She is well-developed. She is not ill-appearing or toxic-appearing.  HENT:     Head: Normocephalic.     Right Ear: Hearing, tympanic membrane, ear canal and external ear normal. Tympanic membrane is not erythematous, retracted or bulging.     Left Ear: Hearing, tympanic membrane, ear canal and external ear normal. Tympanic membrane is not erythematous, retracted or bulging.     Nose: No mucosal edema or rhinorrhea.     Right Sinus: No maxillary sinus tenderness or frontal sinus tenderness.     Left Sinus: No maxillary sinus tenderness or frontal sinus tenderness.     Mouth/Throat:     Pharynx: Uvula midline.  Eyes:     General: Lids are normal. Lids are everted, no foreign bodies appreciated.     Conjunctiva/sclera: Conjunctivae normal.     Pupils: Pupils are equal, round, and reactive to light.  Neck:     Thyroid: No thyroid mass or thyromegaly.     Vascular: No carotid bruit.     Trachea: Trachea normal.  Cardiovascular:     Rate and Rhythm: Normal rate and regular rhythm.     Pulses: Normal pulses.     Heart sounds: Normal heart sounds, S1 normal and S2 normal. No murmur heard.   No friction rub. No gallop.  Pulmonary:     Effort: Pulmonary effort is normal. No tachypnea or  respiratory distress.     Breath sounds: Normal breath sounds. No decreased breath sounds, wheezing, rhonchi or rales.  Abdominal:     General: Bowel sounds are normal.     Palpations: Abdomen is soft.  Tenderness: There is no abdominal tenderness.  Musculoskeletal:     Right elbow: Normal. Normal range of motion.     Left elbow: Decreased range of motion. No tenderness. No radial head, medial epicondyle, lateral epicondyle or olecranon process tenderness.     Left forearm: Swelling and tenderness present.     Left wrist: Normal. No swelling, effusion, tenderness, bony tenderness or snuff box tenderness. Normal range of motion.     Right hand: Deformity present. No tenderness. Normal range of motion.     Left hand: Deformity present. No tenderness. Normal range of motion.     Cervical back: Normal range of motion and neck supple.     Comments:  Apin with supination pronation, minimally ttp over bones  Skin:    General: Skin is warm and dry.     Findings: No rash.  Neurological:     Mental Status: She is alert.  Psychiatric:        Mood and Affect: Mood is not anxious or depressed.        Speech: Speech normal.        Behavior: Behavior normal. Behavior is cooperative.        Thought Content: Thought content normal.        Judgment: Judgment normal.      Results for orders placed or performed during the hospital encounter of 03/05/21  Surgical pathology  Result Value Ref Range   SURGICAL PATHOLOGY      SURGICAL PATHOLOGY CASE: [id number] PATIENT: Meriem Coupland Surgical Pathology Report     Specimen Submitted: A. Colon polyp, transverse; cbx  Clinical History: Colon cancer screening Z12.11.  Colon polyp, hemorrhoids    DIAGNOSIS: A. COLON POLYP, TRANSVERSE; COLD BIOPSY: - TUBULAR ADENOMA. - NEGATIVE FOR HIGH-GRADE DYSPLASIA AND MALIGNANCY.  GROSS DESCRIPTION: A. Labeled: cbx transverse colon polyp Received: Formalin Collection time: 8:34 AM on  03/05/2021 Placed into formalin time: 8:34 AM on 03/05/2021 Tissue fragment(s): 1 Size: 0.3 x 0.3 x 0.2 cm Description: Tan soft tissue fragment Entirely submitted in 1 cassette.  RB 03/05/2021   Final Diagnosis performed by Allena Napoleon, MD.   Electronically signed 03/06/2021 8:52:36AM The electronic signature indicates that the named Attending Pathologist has evaluated the specimen Technical component performed at [facility], [address] [place], [place] [address] Lab: [phone] Dir: Louretta Shorten, MMM  Professional component performed at [facility], [facility], [place], [place], [place] [address] Lab: [phone] Dir: Kathi Simpers, MD     This visit occurred during the SARS-CoV-2 public health emergency.  Safety protocols were in place, including screening questions prior to the visit, additional usage of staff PPE, and extensive cleaning of exam room while observing appropriate contact time as indicated for disinfecting solutions.   COVID 19 screen:  No recent travel or known exposure to COVID19 The patient denies respiratory symptoms of COVID 19 at this time. The importance of social distancing was discussed today.   Assessment and Plan    Problem List Items Addressed This Visit     Accidental fall from or out of building or structure   Left arm pain - Primary   Keep OV with ORTHO for further recs and likely repeat X-ray to assess for callus/ previously unrecognized fracture. MRI not needed at this point.  Can increase tramadol to 100 mg  2 times daily for pain.  Continue ice and elevation. Brace arm  with towel and tape against body as much as able given pt cannot  tolerate arm sling.  Eliezer Lofts, MD

## 2021-07-02 ENCOUNTER — Other Ambulatory Visit: Payer: Self-pay | Admitting: Family Medicine

## 2021-07-02 DIAGNOSIS — E039 Hypothyroidism, unspecified: Secondary | ICD-10-CM

## 2021-07-04 NOTE — Telephone Encounter (Signed)
Please call patient and schedule annual physical. 

## 2021-07-06 ENCOUNTER — Other Ambulatory Visit: Payer: Self-pay | Admitting: Family Medicine

## 2021-07-06 DIAGNOSIS — M19019 Primary osteoarthritis, unspecified shoulder: Secondary | ICD-10-CM

## 2021-07-06 NOTE — Telephone Encounter (Signed)
Name of Medication: Tramadol Name of Pharmacy: [facility] Last Fill or Written Date and Quantity: 06/17/21, #60 Last Office Visit and Type: 06/22/21, L arm pain Next Office Visit and Type: none Last Controlled Substance Agreement Date: none Last UDS: none

## 2021-08-09 ENCOUNTER — Other Ambulatory Visit: Payer: Self-pay | Admitting: Family Medicine

## 2021-08-09 DIAGNOSIS — M19019 Primary osteoarthritis, unspecified shoulder: Secondary | ICD-10-CM

## 2021-08-09 NOTE — Telephone Encounter (Signed)
Last office visit 06/22/2021 for left arm pain.  Last refilled 07/06/2021 for #60 with no refills.  No future appointments with PCP.  ?

## 2021-08-20 ENCOUNTER — Telehealth: Payer: Self-pay

## 2021-08-20 DIAGNOSIS — I10 Essential (primary) hypertension: Secondary | ICD-10-CM

## 2021-08-20 DIAGNOSIS — E785 Hyperlipidemia, unspecified: Secondary | ICD-10-CM

## 2021-08-20 DIAGNOSIS — E039 Hypothyroidism, unspecified: Secondary | ICD-10-CM

## 2021-08-20 MED ORDER — HYDROCHLOROTHIAZIDE 25 MG PO TABS: 25.0000 mg | ORAL_TABLET | Freq: Every day | ORAL | 0 refills | Status: AC

## 2021-08-20 MED ORDER — LEVOTHYROXINE SODIUM 88 MCG PO TABS
88.0000 ug | ORAL_TABLET | Freq: Every day | ORAL | 0 refills | Status: AC
Start: 2021-08-20 — End: ?

## 2021-08-20 MED ORDER — AMLODIPINE BESYLATE 10 MG PO TABS: 10.0000 mg | ORAL_TABLET | Freq: Every day | ORAL | 0 refills | Status: AC

## 2021-08-20 MED ORDER — SIMVASTATIN 20 MG PO TABS
ORAL_TABLET | ORAL | 0 refills | Status: AC
Start: 2021-08-20 — End: ?

## 2021-08-20 NOTE — Telephone Encounter (Signed)
[facility] Night - Client ?TELEPHONE ADVICE RECORD ?[facility]? ?Patient ?Name: ?Heather M ?ILLER ?Gender: Female ?DOB: 02-Oct-1945 ?Age: 76 Y 8 D ?Return ?Phone ?Number: ?[phone] ?(Primary) ?Address: ?City/ ?State/ ?Zip: ?[place] ?[address] ?Client [place] Night - Client ?Client Site [place] ?Provider Eliezer Lofts - MD ?Contact Type Call ?Who Is Calling Patient / Member / Family / Caregiver ?Call Type Triage / Clinical ?Relationship To Patient Self ?Return Phone Number [phone] (Primary) ?Chief Complaint Prescription Refill or Medication Request (non ?symptomatic) ?Reason for Call Medication Question / Request ?Initial Comment Caller states she has lost all of her medications. ?States she is asymptomatic. Wants to know what ?they should be doing. ?Translation No ?No Triage Reason Patient declined ?Nurse Assessment ?Nurse: Donna Christen, RN, Legrand Como Date/Time Eilene Ghazi Time): 08/18/2021 3:34:11 PM ?Confirm and document reason for call. If ?symptomatic, describe symptoms. ?---Caller states that she lost her medication. States ?she is no longer in the city where she normally gets ?her meds and needs them sent to [facility]. Suggested ?she call pharmacy for emergency dose of medication. ?Declined triage at this time. ?Does the patient have any new or worsening ?symptoms? ---Yes ?Will a triage be completed? ---No ?Select reason for no triage. ---Patient declined ?Disp. Time (Eastern ?Time) Disposition Final User ?08/18/2021 3:38:24 PM Clinical Call Yes Donna Christen, RN, Blinda Leatherwood ?

## 2021-08-20 NOTE — Telephone Encounter (Signed)
Refills sent as requested

## 2021-08-20 NOTE — Telephone Encounter (Signed)
Pt is traveling and has lost all of her medication on the road somewhere. Pt said she has been out of all meds for at least 4 days. Pt is requesting a refill of her important meds to the [facility] in [place] she is at. Pt is requesting refills of Amlodipine, levothyroxine, HCTZ, simvastatin, pt said since the refill will be early we may need to call the pharmacy to override it so she can pick them up ?

## 2021-10-12 ENCOUNTER — Ambulatory Visit: Payer: PPO

## 2022-05-06 ENCOUNTER — Other Ambulatory Visit: Payer: Self-pay | Admitting: Family Medicine

## 2022-05-06 DIAGNOSIS — E785 Hyperlipidemia, unspecified: Secondary | ICD-10-CM

## 2022-08-15 ENCOUNTER — Telehealth: Payer: Self-pay | Admitting: Family Medicine

## 2022-08-15 NOTE — Telephone Encounter (Signed)
Contacted Heather Potter to schedule their annual wellness visit. Patient declined to schedule AWV at this time. Transferred care   [place] Direct Dial: [phone]
